# Patient Record
Sex: Female | Born: 1960 | Race: White | Hispanic: No | Marital: Single | Smoking: Former smoker
Health system: Southern US, Community
[De-identification: ages and names within clinical notes are randomized; demographics above are authoritative.]

## PROBLEM LIST (undated history)

## (undated) DIAGNOSIS — C801 Malignant (primary) neoplasm, unspecified: Secondary | ICD-10-CM

## (undated) DIAGNOSIS — K219 Gastro-esophageal reflux disease without esophagitis: Secondary | ICD-10-CM

## (undated) DIAGNOSIS — I739 Peripheral vascular disease, unspecified: Secondary | ICD-10-CM

## (undated) DIAGNOSIS — I75022 Atheroembolism of left lower extremity: Secondary | ICD-10-CM

## (undated) HISTORY — DX: Atheroembolism of left lower extremity: I75.022

---

## 1998-05-21 ENCOUNTER — Encounter: Admission: RE | Admit: 1998-05-21 | Discharge: 1998-05-21 | Payer: Self-pay | Admitting: Family Medicine

## 1998-06-15 ENCOUNTER — Emergency Department (HOSPITAL_COMMUNITY): Admission: EM | Admit: 1998-06-15 | Discharge: 1998-06-15 | Payer: Self-pay | Admitting: Emergency Medicine

## 1998-07-25 ENCOUNTER — Encounter: Admission: RE | Admit: 1998-07-25 | Discharge: 1998-07-25 | Payer: Self-pay | Admitting: Family Medicine

## 1998-10-23 ENCOUNTER — Encounter: Admission: RE | Admit: 1998-10-23 | Discharge: 1998-10-23 | Payer: Self-pay | Admitting: Sports Medicine

## 1999-01-05 ENCOUNTER — Ambulatory Visit (HOSPITAL_COMMUNITY): Admission: RE | Admit: 1999-01-05 | Discharge: 1999-01-05 | Payer: Self-pay | Admitting: Neurosurgery

## 1999-01-05 ENCOUNTER — Encounter: Payer: Self-pay | Admitting: Neurosurgery

## 1999-02-06 ENCOUNTER — Encounter: Admission: RE | Admit: 1999-02-06 | Discharge: 1999-02-06 | Payer: Self-pay | Admitting: Family Medicine

## 1999-05-08 ENCOUNTER — Encounter: Admission: RE | Admit: 1999-05-08 | Discharge: 1999-05-08 | Payer: Self-pay | Admitting: Family Medicine

## 1999-09-25 ENCOUNTER — Encounter: Payer: Self-pay | Admitting: Emergency Medicine

## 1999-09-25 ENCOUNTER — Emergency Department (HOSPITAL_COMMUNITY): Admission: EM | Admit: 1999-09-25 | Discharge: 1999-09-25 | Payer: Self-pay | Admitting: Emergency Medicine

## 1999-10-18 ENCOUNTER — Encounter: Admission: RE | Admit: 1999-10-18 | Discharge: 1999-10-18 | Payer: Self-pay | Admitting: Family Medicine

## 2000-07-05 ENCOUNTER — Emergency Department (HOSPITAL_COMMUNITY): Admission: EM | Admit: 2000-07-05 | Discharge: 2000-07-05 | Payer: Self-pay | Admitting: Emergency Medicine

## 2000-07-08 ENCOUNTER — Encounter: Admission: RE | Admit: 2000-07-08 | Discharge: 2000-07-08 | Payer: Self-pay | Admitting: Family Medicine

## 2000-12-08 ENCOUNTER — Encounter: Admission: RE | Admit: 2000-12-08 | Discharge: 2000-12-08 | Payer: Self-pay | Admitting: Family Medicine

## 2000-12-08 ENCOUNTER — Encounter: Payer: Self-pay | Admitting: Family Medicine

## 2000-12-15 ENCOUNTER — Encounter: Admission: RE | Admit: 2000-12-15 | Discharge: 2000-12-15 | Payer: Self-pay | Admitting: Family Medicine

## 2001-02-15 ENCOUNTER — Encounter: Admission: RE | Admit: 2001-02-15 | Discharge: 2001-02-15 | Payer: Self-pay | Admitting: Family Medicine

## 2001-04-29 ENCOUNTER — Encounter: Payer: Self-pay | Admitting: *Deleted

## 2001-04-29 ENCOUNTER — Encounter: Admission: RE | Admit: 2001-04-29 | Discharge: 2001-04-29 | Payer: Self-pay | Admitting: *Deleted

## 2001-04-29 ENCOUNTER — Encounter: Admission: RE | Admit: 2001-04-29 | Discharge: 2001-04-29 | Payer: Self-pay | Admitting: Family Medicine

## 2001-05-05 ENCOUNTER — Encounter: Admission: RE | Admit: 2001-05-05 | Discharge: 2001-05-05 | Payer: Self-pay | Admitting: Family Medicine

## 2002-07-13 ENCOUNTER — Encounter: Payer: Self-pay | Admitting: Sports Medicine

## 2002-07-13 ENCOUNTER — Encounter: Admission: RE | Admit: 2002-07-13 | Discharge: 2002-07-13 | Payer: Self-pay | Admitting: Family Medicine

## 2002-07-13 ENCOUNTER — Encounter: Admission: RE | Admit: 2002-07-13 | Discharge: 2002-07-13 | Payer: Self-pay | Admitting: Sports Medicine

## 2002-07-19 ENCOUNTER — Encounter: Admission: RE | Admit: 2002-07-19 | Discharge: 2002-07-19 | Payer: Self-pay | Admitting: Family Medicine

## 2002-08-02 ENCOUNTER — Encounter: Payer: Self-pay | Admitting: Sports Medicine

## 2002-08-02 ENCOUNTER — Encounter: Admission: RE | Admit: 2002-08-02 | Discharge: 2002-08-02 | Payer: Self-pay | Admitting: Sports Medicine

## 2002-08-09 ENCOUNTER — Encounter: Admission: RE | Admit: 2002-08-09 | Discharge: 2002-08-09 | Payer: Self-pay | Admitting: Family Medicine

## 2002-08-11 ENCOUNTER — Encounter: Admission: RE | Admit: 2002-08-11 | Discharge: 2002-08-11 | Payer: Self-pay | Admitting: Family Medicine

## 2002-08-18 ENCOUNTER — Encounter: Admission: RE | Admit: 2002-08-18 | Discharge: 2002-08-18 | Payer: Self-pay | Admitting: Family Medicine

## 2002-08-25 ENCOUNTER — Encounter: Admission: RE | Admit: 2002-08-25 | Discharge: 2002-08-25 | Payer: Self-pay | Admitting: Sports Medicine

## 2002-09-07 ENCOUNTER — Encounter: Admission: RE | Admit: 2002-09-07 | Discharge: 2002-09-07 | Payer: Self-pay | Admitting: Sports Medicine

## 2002-09-07 ENCOUNTER — Encounter: Admission: RE | Admit: 2002-09-07 | Discharge: 2002-09-07 | Payer: Self-pay | Admitting: Family Medicine

## 2002-09-07 ENCOUNTER — Encounter: Payer: Self-pay | Admitting: Sports Medicine

## 2002-09-23 ENCOUNTER — Encounter: Admission: RE | Admit: 2002-09-23 | Discharge: 2002-09-23 | Payer: Self-pay | Admitting: Family Medicine

## 2002-10-10 ENCOUNTER — Encounter: Admission: RE | Admit: 2002-10-10 | Discharge: 2002-10-10 | Payer: Self-pay | Admitting: Family Medicine

## 2002-10-26 ENCOUNTER — Encounter: Admission: RE | Admit: 2002-10-26 | Discharge: 2002-10-26 | Payer: Self-pay | Admitting: Sports Medicine

## 2002-12-27 ENCOUNTER — Encounter: Admission: RE | Admit: 2002-12-27 | Discharge: 2002-12-27 | Payer: Self-pay | Admitting: Family Medicine

## 2007-02-25 DIAGNOSIS — F411 Generalized anxiety disorder: Secondary | ICD-10-CM | POA: Insufficient documentation

## 2007-02-25 DIAGNOSIS — F329 Major depressive disorder, single episode, unspecified: Secondary | ICD-10-CM | POA: Insufficient documentation

## 2007-02-25 DIAGNOSIS — K219 Gastro-esophageal reflux disease without esophagitis: Secondary | ICD-10-CM

## 2019-04-05 ENCOUNTER — Ambulatory Visit (HOSPITAL_COMMUNITY)
Admission: RE | Admit: 2019-04-05 | Discharge: 2019-04-05 | Disposition: A | Discharge status: Home / Self Care | Payer: Self-pay | Source: Ambulatory Visit | Attending: Vascular Surgery | Admitting: Vascular Surgery

## 2019-04-05 ENCOUNTER — Other Ambulatory Visit (HOSPITAL_COMMUNITY): Payer: Self-pay | Admitting: Vascular Surgery

## 2019-04-05 ENCOUNTER — Encounter: Payer: Self-pay | Admitting: Vascular Surgery

## 2019-04-05 ENCOUNTER — Other Ambulatory Visit: Payer: Self-pay

## 2019-04-05 ENCOUNTER — Encounter: Payer: Self-pay | Admitting: *Deleted

## 2019-04-05 ENCOUNTER — Ambulatory Visit (INDEPENDENT_AMBULATORY_CARE_PROVIDER_SITE_OTHER)
Admission: RE | Admit: 2019-04-05 | Discharge: 2019-04-05 | Disposition: A | Discharge status: Home / Self Care | Payer: Self-pay | Source: Ambulatory Visit | Attending: Family | Admitting: Family

## 2019-04-05 ENCOUNTER — Ambulatory Visit (INDEPENDENT_AMBULATORY_CARE_PROVIDER_SITE_OTHER): Payer: Self-pay | Admitting: Vascular Surgery

## 2019-04-05 VITALS — BP 162/81 | HR 76 | Temp 97.2°F | Resp 20 | Ht 64.0 in | Wt 137.0 lb

## 2019-04-05 DIAGNOSIS — M79605 Pain in left leg: Secondary | ICD-10-CM

## 2019-04-05 DIAGNOSIS — I739 Peripheral vascular disease, unspecified: Secondary | ICD-10-CM

## 2019-04-05 NOTE — Progress Notes (Signed)
Number for financial counselor given. Patient would not book case today. I gave her my number to call when ready to schedule procedure. Printed information given explaining procedure and aftercare. Encouraged to call to book soon due to severity of condition.

## 2019-04-05 NOTE — Progress Notes (Signed)
[facility] [place]  Patient name: Barbara Watkins MRN: [id number] DOB: 1961/09/16 Sex: female  REASON FOR CONSULT: Evaluation of left foot ischemia  HPI: Barbara Watkins is a 58 y.o. female, who is here today for evaluation of left foot ischemia.  She reports that approximately days ago she began noticing pain and bluish discoloration on the toes of her left foot.  She presented to emergency room and [facility] on 04/02/2019.  She was placed on aspirin therapy and discharged home and referred to our office.  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  She does report that she uses her left foot but did never have trouble up into this point.  She is a longtime cigarette smoker but reports that she quit 3 weeks ago.  She does report left calf claudication symptoms.  This is mildly limiting to her.  She reports that if she walks up several flights of stairs that she does have a calf tightness and has to delay proceeding.  This is not limiting to her.  Has no cardiac history  History reviewed. No pertinent past medical history.  History reviewed. No pertinent family history.  SOCIAL HISTORY: Social History   Socioeconomic History  . Marital status: Single    Spouse name: Not on file  . Number of children: Not on file  . Years of education: Not on file  . Highest education level: Not on file  Occupational History  . Not on file  Social Needs  . Financial resource strain: Not on file  . Food insecurity:    Worry: Not on file    Inability: Not on file  . Transportation needs:    Medical: Not on file    Non-medical: Not on file  Tobacco Use  . Smoking status: Former Smoker    Packs/day: 0.25    Types: Cigarettes    Last attempt to quit: 03/21/2019    Years since quitting: 0.0  . Smokeless tobacco: Never Used  Substance and Sexual Activity  . Alcohol use: Not Currently  . Drug use: Not Currently  . Sexual activity:  Not on file  Lifestyle  . Physical activity:    Days per week: Not on file    Minutes per session: Not on file  . Stress: Not on file  Relationships  . Social connections:    Talks on phone: Not on file    Gets together: Not on file    Attends religious service: Not on file    Active member of club or organization: Not on file    Attends meetings of clubs or organizations: Not on file    Relationship status: Not on file  . Intimate partner violence:    Fear of current or ex partner: Not on file    Emotionally abused: Not on file    Physically abused: Not on file    Forced sexual activity: Not on file  Other Topics Concern  . Not on file  Social History Narrative  . Not on file    No Known Allergies  Current Outpatient Medications  Medication Sig Dispense Refill  . aspirin EC 81 MG tablet Take 81 mg by mouth daily.    . naproxen sodium (ALEVE) 220 MG tablet Take 220 mg by mouth daily as needed.     No current facility-administered medications for this visit.     REVIEW OF SYSTEMS:  [X]  denotes positive finding, [ ]  denotes negative finding  Cardiac  Comments:  Chest pain or chest pressure:    Shortness of breath upon exertion:    Short of breath when lying flat:    Irregular heart rhythm:        Vascular    Pain in calf, thigh, or hip brought on by ambulation: x   Pain in feet at night that wakes you up from your sleep:  x   Blood clot in your veins:    Leg swelling:         Pulmonary    Oxygen at home:    Productive cough:     Wheezing:         Neurologic    Sudden weakness in arms or legs:     Sudden numbness in arms or legs:     Sudden onset of difficulty speaking or slurred speech:    Temporary loss of vision in one eye:     Problems with dizziness:         Gastrointestinal    Blood in stool:     Vomited blood:         Genitourinary    Burning when urinating:     Blood in urine:        Psychiatric    Major depression:         Hematologic     Bleeding problems:    Problems with blood clotting too easily:        Skin    Rashes or ulcers:        Constitutional    Fever or chills:      PHYSICAL EXAM: Vitals:   04/05/19 1110  BP: (!) 162/81  Pulse: 76  Resp: 20  Temp: (!) 97.2 F (36.2 C)  SpO2: 99%  Weight: 137 lb (62.1 kg)  Height: 5\' 4"  (1.626 m)    GENERAL: The patient is a well-nourished female, in no acute distress. The vital signs are documented above. CARDIOVASCULAR: Plus radial pulses bilaterally.  Palpable femoral pulses bilaterally.  Absent left popliteal and distal pulses.  2+ right dorsalis pedis pulse PULMONARY: There is good air exchange  ABDOMEN: Soft and non-tender.  No bruit present MUSCULOSKELETAL: There are no major deformities or cyanosis. NEUROLOGIC: No focal weakness or paresthesias are detected. SKIN: There are no ulcers or rashes noted.  Cyanosis of the toes of her left foot PSYCHIATRIC: The patient has a normal affect.  DATA:  Noninvasive studies reveal normal ankle arm index on the right with triphasic waveforms.  On the left her ankle arm index is 0.74 with diminished waveforms.  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  I have recommended arteriography for further evaluation potential treatment.  I did explain the risk of ongoing embolization and potential limb loss.  She is a concerned regarding finances since she does not have any insurance.  We will also give her contact information for financial resources through [place]. Bleu Moisan, MD [facility] Tel [phone] Pager [phone]

## 2019-04-06 ENCOUNTER — Other Ambulatory Visit: Payer: Self-pay | Admitting: *Deleted

## 2019-04-06 NOTE — Progress Notes (Signed)
Instructed to be at [facility] admitting at 6:30 am on 04/11/2019. Visitor restrictions discussed. No food or drink after MN night prior. No am meds to take. Discussed expectations/ procedure. Answered questions. Must have a driver and caregiver for discharge.  Verbalized understanding.

## 2019-04-07 ENCOUNTER — Telehealth: Payer: Self-pay | Admitting: *Deleted

## 2019-04-07 NOTE — Telephone Encounter (Signed)
Left message on voice mail. 04/11/2019 procedure time changed. To arrive at [facility] admitting at 8  am. All other instructions unchanged.

## 2019-04-11 ENCOUNTER — Encounter (HOSPITAL_COMMUNITY)
Admission: RE | Disposition: A | Discharge status: Home / Self Care | Payer: Self-pay | Source: Home / Self Care | Attending: Vascular Surgery

## 2019-04-11 ENCOUNTER — Other Ambulatory Visit: Payer: Self-pay

## 2019-04-11 ENCOUNTER — Ambulatory Visit (HOSPITAL_COMMUNITY)
Admission: RE | Admit: 2019-04-11 | Discharge: 2019-04-11 | Disposition: A | Discharge status: Home / Self Care | Payer: Self-pay | Attending: Vascular Surgery | Admitting: Vascular Surgery

## 2019-04-11 DIAGNOSIS — M79672 Pain in left foot: Secondary | ICD-10-CM | POA: Insufficient documentation

## 2019-04-11 DIAGNOSIS — Z7982 Long term (current) use of aspirin: Secondary | ICD-10-CM | POA: Insufficient documentation

## 2019-04-11 DIAGNOSIS — I998 Other disorder of circulatory system: Secondary | ICD-10-CM | POA: Insufficient documentation

## 2019-04-11 DIAGNOSIS — Z87891 Personal history of nicotine dependence: Secondary | ICD-10-CM | POA: Insufficient documentation

## 2019-04-11 HISTORY — PX: PERIPHERAL VASCULAR INTERVENTION: CATH118257

## 2019-04-11 HISTORY — PX: ABDOMINAL AORTOGRAM W/LOWER EXTREMITY: CATH118223

## 2019-04-11 LAB — POCT I-STAT 4, (NA,K, GLUC, HGB,HCT)
Glucose, Bld: 109 mg/dL — ABNORMAL HIGH (ref 70–99)
HCT: 41 % (ref 36.0–46.0)
Hemoglobin: 13.9 g/dL (ref 12.0–15.0)
Potassium: 3.8 mmol/L (ref 3.5–5.1)
Sodium: 138 mmol/L (ref 135–145)

## 2019-04-11 LAB — POCT I-STAT CREATININE: Creatinine, Ser: 0.7 mg/dL (ref 0.44–1.00)

## 2019-04-11 SURGERY — ABDOMINAL AORTOGRAM W/LOWER EXTREMITY
Anesthesia: LOCAL

## 2019-04-11 MED ORDER — FENTANYL CITRATE (PF) 100 MCG/2ML IJ SOLN
INTRAMUSCULAR | Status: AC
  Filled 2019-04-11: qty 2

## 2019-04-11 MED ORDER — OXYCODONE HCL 5 MG PO TABS: 5.0000 mg | ORAL_TABLET | ORAL | Status: DC | PRN

## 2019-04-11 MED ORDER — LIDOCAINE HCL (PF) 1 % IJ SOLN
INTRAMUSCULAR | Status: AC
  Filled 2019-04-11: qty 30

## 2019-04-11 MED ORDER — SODIUM CHLORIDE 0.9 % WEIGHT BASED INFUSION: 1.0000 mL/kg/h | INTRAVENOUS | Status: DC

## 2019-04-11 MED ORDER — LIDOCAINE HCL (PF) 1 % IJ SOLN
INTRAMUSCULAR | Status: DC | PRN
  Administered 2019-04-11 (×2): 15 mL via INTRADERMAL

## 2019-04-11 MED ORDER — ASPIRIN EC 81 MG PO TBEC: 81.0000 mg | DELAYED_RELEASE_TABLET | Freq: Every day | ORAL | Status: DC

## 2019-04-11 MED ORDER — CLOPIDOGREL BISULFATE 75 MG PO TABS: 300.0000 mg | ORAL_TABLET | Freq: Once | ORAL | Status: DC

## 2019-04-11 MED ORDER — SODIUM CHLORIDE 0.9 % IV SOLN: 250.0000 mL | INTRAVENOUS | Status: DC | PRN

## 2019-04-11 MED ORDER — SODIUM CHLORIDE 0.9% FLUSH: 3.0000 mL | Freq: Two times a day (BID) | INTRAVENOUS | Status: DC

## 2019-04-11 MED ORDER — HEPARIN (PORCINE) IN NACL 1000-0.9 UT/500ML-% IV SOLN
INTRAVENOUS | Status: AC
  Filled 2019-04-11: qty 1000

## 2019-04-11 MED ORDER — HEPARIN SODIUM (PORCINE) 1000 UNIT/ML IJ SOLN
INTRAMUSCULAR | Status: DC | PRN
  Administered 2019-04-11: 6000 [IU] via INTRAVENOUS

## 2019-04-11 MED ORDER — CLOPIDOGREL BISULFATE 300 MG PO TABS
ORAL_TABLET | ORAL | Status: AC
  Filled 2019-04-11: qty 1

## 2019-04-11 MED ORDER — SODIUM CHLORIDE 0.9 % IV SOLN
INTRAVENOUS | Status: DC
  Administered 2019-04-11: 09:00:00 via INTRAVENOUS

## 2019-04-11 MED ORDER — MIDAZOLAM HCL 2 MG/2ML IJ SOLN
INTRAMUSCULAR | Status: AC
  Filled 2019-04-11: qty 2

## 2019-04-11 MED ORDER — LABETALOL HCL 5 MG/ML IV SOLN: 10.0000 mg | INTRAVENOUS | Status: DC | PRN

## 2019-04-11 MED ORDER — CLOPIDOGREL BISULFATE 300 MG PO TABS
ORAL_TABLET | ORAL | Status: DC | PRN
  Administered 2019-04-11: 300 mg via ORAL

## 2019-04-11 MED ORDER — ONDANSETRON HCL 4 MG/2ML IJ SOLN: 4.0000 mg | Freq: Four times a day (QID) | INTRAMUSCULAR | Status: DC | PRN

## 2019-04-11 MED ORDER — SODIUM CHLORIDE 0.9% FLUSH: 3.0000 mL | INTRAVENOUS | Status: DC | PRN

## 2019-04-11 MED ORDER — CLOPIDOGREL BISULFATE 75 MG PO TABS: 75.0000 mg | ORAL_TABLET | Freq: Every day | ORAL | 11 refills | Status: AC

## 2019-04-11 MED ORDER — ATORVASTATIN CALCIUM 20 MG PO TABS: 20.0000 mg | ORAL_TABLET | Freq: Every day | ORAL | 11 refills | Status: DC

## 2019-04-11 MED ORDER — FENTANYL CITRATE (PF) 100 MCG/2ML IJ SOLN
25.0000 ug | INTRAMUSCULAR | Status: DC | PRN
  Administered 2019-04-11: 25 ug via INTRAVENOUS

## 2019-04-11 MED ORDER — CLOPIDOGREL BISULFATE 75 MG PO TABS: 75.0000 mg | ORAL_TABLET | Freq: Every day | ORAL | Status: DC

## 2019-04-11 MED ORDER — HYDRALAZINE HCL 20 MG/ML IJ SOLN: 5.0000 mg | INTRAMUSCULAR | Status: DC | PRN

## 2019-04-11 MED ORDER — FENTANYL CITRATE (PF) 100 MCG/2ML IJ SOLN
INTRAMUSCULAR | Status: DC | PRN
  Administered 2019-04-11 (×2): 25 ug via INTRAVENOUS

## 2019-04-11 MED ORDER — IODIXANOL 320 MG/ML IV SOLN
INTRAVENOUS | Status: DC | PRN
  Administered 2019-04-11: 160 mL via INTRA_ARTERIAL

## 2019-04-11 MED ORDER — HEPARIN (PORCINE) IN NACL 1000-0.9 UT/500ML-% IV SOLN
INTRAVENOUS | Status: DC | PRN
  Administered 2019-04-11 (×2): 500 mL

## 2019-04-11 MED ORDER — MIDAZOLAM HCL 2 MG/2ML IJ SOLN
INTRAMUSCULAR | Status: DC | PRN
  Administered 2019-04-11 (×2): 1 mg via INTRAVENOUS

## 2019-04-11 MED ORDER — ATORVASTATIN CALCIUM 20 MG PO TABS: 20.0000 mg | ORAL_TABLET | Freq: Every day | ORAL | Status: DC

## 2019-04-11 MED ORDER — ACETAMINOPHEN 325 MG PO TABS: 650.0000 mg | ORAL_TABLET | ORAL | Status: DC | PRN

## 2019-04-11 MED ORDER — HYDROCODONE-ACETAMINOPHEN 5-325 MG PO TABS: 1.0000 | ORAL_TABLET | ORAL | 0 refills | Status: DC | PRN

## 2019-04-11 SURGICAL SUPPLY — 19 items
CATH BEACON 5 .035 65 KMP TIP (CATHETERS) ×1 IMPLANT
CATH OMNI FLUSH 5F 65CM (CATHETERS) ×1 IMPLANT
CLOSURE MYNX CONTROL 5F (Vascular Products) ×1 IMPLANT
CLOSURE MYNX CONTROL 6F/7F (Vascular Products) ×1 IMPLANT
GUIDEWIRE ANGLED .035X150CM (WIRE) ×1 IMPLANT
KIT ENCORE 26 ADVANTAGE (KITS) ×1 IMPLANT
KIT MICROPUNCTURE NIT STIFF (SHEATH) ×1 IMPLANT
KIT PV (KITS) ×3 IMPLANT
SHEATH BRITE TIP 7FR 35CM (SHEATH) ×1 IMPLANT
SHEATH PINNACLE 5F 10CM (SHEATH) ×2 IMPLANT
SHEATH PINNACLE 7F 10CM (SHEATH) ×1 IMPLANT
SHEATH PROBE COVER 6X72 (BAG) ×1 IMPLANT
STENT VIABAHN VBX 6X39X135 (Permanent Stent) ×1 IMPLANT
SYR MEDRAD MARK V 150ML (SYRINGE) ×1 IMPLANT
TRANSDUCER W/STOPCOCK (MISCELLANEOUS) ×3 IMPLANT
TRAY PV CATH (CUSTOM PROCEDURE TRAY) ×3 IMPLANT
WIRE AMPLATZ SS-J .035X180CM (WIRE) ×1 IMPLANT
WIRE AMPLATZ SS-J .035X260CM (WIRE) ×1 IMPLANT
WIRE BENTSON .035X145CM (WIRE) ×1 IMPLANT

## 2019-04-11 NOTE — Progress Notes (Signed)
Pt sitting up in bed rocking and holding her left foot, toes purple and part of foot. Pt does not take anything currently for pain, tears are streaming down and she is continuing to rock despite repositioning. Dr Carlis Abbott was called and updated, orders followed.

## 2019-04-11 NOTE — Discharge Instructions (Signed)
Angiogram, Care After °This sheet gives you information about how to care for yourself after your procedure. Your health care provider may also give you more specific instructions. If you have problems or questions, contact your health care provider. °What can I expect after the procedure? °After the procedure, it is common to have bruising and tenderness at the catheter insertion area. °Follow these instructions at home: °Insertion site care °· Follow instructions from your health care provider about how to take care of your insertion site. Make sure you: °? Wash your hands with soap and water before you change your bandage (dressing). If soap and water are not available, use hand sanitizer. °? Change your dressing as told by your health care provider. °? Leave stitches (sutures), skin glue, or adhesive strips in place. These skin closures may need to stay in place for 2 weeks or longer. If adhesive strip edges start to loosen and curl up, you may trim the loose edges. Do not remove adhesive strips completely unless your health care provider tells you to do that. °· Do not take baths, swim, or use a hot tub until your health care provider approves. °· You may shower 24-48 hours after the procedure or as told by your health care provider. °? Gently wash the site with plain soap and water. °? Pat the area dry with a clean towel. °? Do not rub the site. This may cause bleeding. °· Do not apply powder or lotion to the site. Keep the site clean and dry. °· Check your insertion site every day for signs of infection. Check for: °? Redness, swelling, or pain. °? Fluid or blood. °? Warmth. °? Pus or a bad smell. °Activity °· Rest as told by your health care provider, usually for 1-2 days. °· Do not lift anything that is heavier than 10 lbs. (4.5 kg) or as told by your health care provider. °· Do not drive for 24 hours if you were given a medicine to help you relax (sedative). °· Do not drive or use heavy machinery while  taking prescription pain medicine. °General instructions ° °· Return to your normal activities as told by your health care provider, usually in about a week. Ask your health care provider what activities are safe for you. °· If the catheter site starts bleeding, lie flat and put pressure on the site. If the bleeding does not stop, get help right away. This is a medical emergency. °· Drink enough fluid to keep your urine clear or pale yellow. This helps flush the contrast dye from your body. °· Take over-the-counter and prescription medicines only as told by your health care provider. °· Keep all follow-up visits as told by your health care provider. This is important. °Contact a health care provider if: °· You have a fever or chills. °· You have redness, swelling, or pain around your insertion site. °· You have fluid or blood coming from your insertion site. °· The insertion site feels warm to the touch. °· You have pus or a bad smell coming from your insertion site. °· You have bruising around the insertion site. °· You notice blood collecting in the tissue around the catheter site (hematoma). The hematoma may be painful to the touch. °Get help right away if: °· You have severe pain at the catheter insertion area. °· The catheter insertion area swells very fast. °· The catheter insertion area is bleeding, and the bleeding does not stop when you hold steady pressure on the area. °·   The area near or just beyond the catheter insertion site becomes pale, cool, tingly, or numb. These symptoms may represent a serious problem that is an emergency. Do not wait to see if the symptoms will go away. Get medical help right away. Call your local emergency services (911 in the [place] Do not drive yourself to the hospital. Summary  After the procedure, it is common to have bruising and tenderness at the catheter insertion area.  After the procedure, it is important to rest and drink plenty of fluids.  Do not take baths,  swim, or use a hot tub until your health care provider says it is okay to do so. You may shower 24-48 hours after the procedure or as told by your health care provider.  If the catheter site starts bleeding, lie flat and put pressure on the site. If the bleeding does not stop, get help right away. This is a medical emergency. This information is not intended to replace advice given to you by your health care provider. Make sure you discuss any questions you have with your health care provider. Document Released: 07/03/2005 Document Revised: 11/19/2016 Document Reviewed: 11/19/2016 Elsevier Interactive Patient Education  2019 Elsevier Inc. Clopidogrel tablets What is this medicine? CLOPIDOGREL (kloh PID oh grel) helps to prevent blood clots. This medicine is used to prevent heart attack, stroke, or other vascular events in people who are at high risk. This medicine may be used for other purposes; ask your health care provider or pharmacist if you have questions. COMMON BRAND NAME(S): Plavix What should I tell my health care provider before I take this medicine? They need to know if you have any of the following conditions: -bleeding disorders -bleeding in the brain -having surgery -history of stomach bleeding -an unusual or allergic reaction to clopidogrel, other medicines, foods, dyes, or preservatives -pregnant or trying to get pregnant -breast-feeding How should I use this medicine? Take this medicine by mouth with a glass of water. Follow the directions on the prescription label. You may take this medicine with or without food. If it upsets your stomach, take it with food. Take your medicine at regular intervals. Do not take it more often than directed. Do not stop taking except on your doctor's advice. A special MedGuide will be given to you by the pharmacist with each prescription and refill. Be sure to read this information carefully each time. Talk to your pediatrician regarding the  use of this medicine in children. Special care may be needed. Overdosage: If you think you have taken too much of this medicine contact a poison control center or emergency room at once. NOTE: This medicine is only for you. Do not share this medicine with others. What if I miss a dose? If you miss a dose, take it as soon as you can. If it is almost time for your next dose, take only that dose. Do not take double or extra doses. What may interact with this medicine? Do not take this medicine with the following medications: -dasabuvir; ombitasvir; paritaprevir; ritonavir -defibrotide -selexipag This medicine may also interact with the following medications: -certain medicines that treat or prevent blood clots like warfarin -narcotic medicines for pain -NSAIDs, medicines for pain and inflammation, like ibuprofen or naproxen -repaglinide -SNRIs, medicines for depression, like desvenlafaxine, duloxetine, levomilnacipran, venlafaxine -SSRIs, medicines for depression, like citalopram, escitalopram, fluoxetine, fluvoxamine, paroxetine, sertraline -stomach acid blockers like cimetidine, esomeprazole, omeprazole This list may not describe all possible interactions. Give your health care provider a  list of all the medicines, herbs, non-prescription drugs, or dietary supplements you use. Also tell them if you smoke, drink alcohol, or use illegal drugs. Some items may interact with your medicine. What should I watch for while using this medicine? Visit your doctor or health care professional for regular check-ups. Do not stop taking your medicine unless your doctor tells you to. Notify your doctor or health care professional and seek emergency treatment if you develop breathing problems; changes in vision; chest pain; severe, sudden headache; pain, swelling, warmth in the leg; trouble speaking; sudden numbness or weakness of the face, arm or leg. These can be signs that your condition has gotten worse. If  you are going to have surgery or dental work, tell your doctor or health care professional that you are taking this medicine. Certain genetic factors may reduce the effect of this medicine. Your doctor may use genetic tests to determine treatment. Only take aspirin if you are instructed to. Low doses of aspirin are used with this medicine to treat some conditions. Taking aspirin with this medicine can increase your risk of bleeding so you must be careful. Talk to your doctor or pharmacist if you have questions. What side effects may I notice from receiving this medicine? Side effects that you should report to your doctor or health care professional as soon as possible: -allergic reactions like skin rash, itching or hives, swelling of the face, lips, or tongue -signs and symptoms of bleeding such as bloody or black, tarry stools; red or dark-brown urine; spitting up blood or brown material that looks like coffee grounds; red spots on the skin; unusual bruising or bleeding from the eye, gums, or nose -signs and symptoms of a blood clot such as breathing problems; changes in vision; chest pain; severe, sudden headache; pain, swelling, warmth in the leg; trouble speaking; sudden numbness or weakness of the face, arm or leg -signs and symptoms of low blood sugar such as feeling anxious; confusion; dizziness; increased hunger; unusually weak or tired; increased sweating; shakiness; cold, clammy skin; irritable; headache; blurred vision; fast heartbeat; loss of consciousness Side effects that usually do not require medical attention (report to your doctor or health care professional if they continue or are bothersome): -constipation -diarrhea -headache -upset stomach This list may not describe all possible side effects. Call your doctor for medical advice about side effects. You may report side effects to FDA at 1-800-FDA-1088. Where should I keep my medicine? Keep out of the reach of children. Store at  room temperature of 59 to 86 degrees F (15 to 30 degrees C). Throw away any unused medicine after the expiration date. NOTE: This sheet is a summary. It may not cover all possible information. If you have questions about this medicine, talk to your doctor, pharmacist, or health care provider.  2019 Elsevier/Gold Standard (2018-05-17 15:03:38) Atorvastatin tablets What is this medicine? ATORVASTATIN (a TORE va sta tin) is known as a HMG-CoA reductase inhibitor or 'statin'. It lowers the level of cholesterol and triglycerides in the blood. This drug may also reduce the risk of heart attack, stroke, or other health problems in patients with risk factors for heart disease. Diet and lifestyle changes are often used with this drug. This medicine may be used for other purposes; ask your health care provider or pharmacist if you have questions. COMMON BRAND NAME(S): Lipitor What should I tell my health care provider before I take this medicine? They need to know if you have any of these conditions: -  diabetes -if you often drink alcohol -history of stroke -kidney disease -liver disease -muscle aches or weakness -thyroid disease -an unusual or allergic reaction to atorvastatin, other medicines, foods, dyes, or preservatives -pregnant or trying to get pregnant -breast-feeding How should I use this medicine? Take this medicine by mouth with a glass of water. Follow the directions on the prescription label. You can take it with or without food. If it upsets your stomach, take it with food. Do not take with grapefruit juice. Take your medicine at regular intervals. Do not take it more often than directed. Do not stop taking except on your doctor's advice. Talk to your pediatrician regarding the use of this medicine in children. While this drug may be prescribed for children as young as 10 for selected conditions, precautions do apply. Overdosage: If you think you have taken too much of this medicine  contact a poison control center or emergency room at once. NOTE: This medicine is only for you. Do not share this medicine with others. What if I miss a dose? If you miss a dose, take it as soon as you can. If your next dose is to be taken in less than 12 hours, then do not take the missed dose. Take the next dose at your regular time. Do not take double or extra doses. What may interact with this medicine? Do not take this medicine with any of the following medications: -dasabuvir; ombitasvir; paritaprevir; ritonavir -ombitasvir; paritaprevir; ritonavir -posaconazole -red yeast rice This medicine may also interact with the following medications: -alcohol -birth control pills -certain antibiotics like erythromycin and clarithromycin -certain antivirals for HIV or hepatitis -certain medicines for cholesterol like fenofibrate, gemfibrozil, and niacin -certain medicines for fungal infections like ketoconazole and itraconazole -colchicine -cyclosporine -digoxin -grapefruit juice -rifampin This list may not describe all possible interactions. Give your health care provider a list of all the medicines, herbs, non-prescription drugs, or dietary supplements you use. Also tell them if you smoke, drink alcohol, or use illegal drugs. Some items may interact with your medicine. What should I watch for while using this medicine? Visit your doctor or health care professional for regular check-ups. You may need regular tests to make sure your liver is working properly. Your health care professional may tell you to stop taking this medicine if you develop muscle problems. If your muscle problems do not go away after stopping this medicine, contact your health care professional. Do not become pregnant while taking this medicine. Women should inform their health care professional if they wish to become pregnant or think they might be pregnant. There is a potential for serious side effects to an unborn child.  Talk to your health care professional or pharmacist for more information. Do not breast-feed an infant while taking this medicine. This medicine may affect blood sugar levels. If you have diabetes, check with your doctor or health care professional before you change your diet or the dose of your diabetic medicine. If you are going to need surgery or other procedure, tell your doctor that you are using this medicine. This drug is only part of a total heart-health program. Your doctor or a dietician can suggest a low-cholesterol and low-fat diet to help. Avoid alcohol and smoking, and keep a proper exercise schedule. This medicine may cause a decrease in Co-Enzyme Q-10. You should make sure that you get enough Co-Enzyme Q-10 while you are taking this medicine. Discuss the foods you eat and the vitamins you take with your health care  professional. What side effects may I notice from receiving this medicine? Side effects that you should report to your doctor or health care professional as soon as possible: -allergic reactions like skin rash, itching or hives, swelling of the face, lips, or tongue -fever -joint pain -loss of memory -redness, blistering, peeling or loosening of the skin, including inside the mouth -signs and symptoms of liver injury like dark yellow or brown urine; general ill feeling or flu-like symptoms; light-belly pain; unusually weak or tired; yellowing of the eyes or skin -signs and symptoms of muscle injury like dark urine; trouble passing urine or change in the amount of urine; unusually weak or tired; muscle pain or side or back pain Side effects that usually do not require medical attention (report to your doctor or health care professional if they continue or are bothersome): -diarrhea -nausea -stomach pain -trouble sleeping -upset stomach This list may not describe all possible side effects. Call your doctor for medical advice about side effects. You may report side effects  to FDA at 1-800-FDA-1088. Where should I keep my medicine? Keep out of the reach of children. Store between 20 and 25 degrees C (68 and 77 degrees F). Throw away any unused medicine after the expiration date. NOTE: This sheet is a summary. It may not cover all possible information. If you have questions about this medicine, talk to your doctor, pharmacist, or health care provider.  2019 Elsevier/Gold Standard (2018-04-16 11:36:48)

## 2019-04-11 NOTE — Op Note (Signed)
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  Ultrasound-guided access of the right common femoral artery 2.  Aortogram with bilateral lower extremity arteriogram 3.  Ultrasound-guided access of the left common femoral artery 4.  Left common iliac artery angioplasty with stent placement (6 mm x 39 mm balloon expandable VBX covered stent) 5.  Mynx closure of bilateral common femoral arteries 6.  53 minutes of monitored moderate conscious sedation time  Indications: 58 year old female who presented to clinic last week to be evaluated Dr. Donnetta Hutching with concern for left foot ischemia.  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  She presents today for aortogram lower extremity arteriogram and possible intervention after risks benefits were discussed.  Findings: Aortogram showed a focal greater than 90% stenosis of the left common iliac artery with no other significant aortoiliac disease.   Left lower extremity arteriogram which was the side of interest had a patent common femoral, profunda, SFA, popliteal and two-vessel runoff via the anterior tibial and peroneal artery.  The posterior tibial appeared occluded in the left lower extremity but she did reconstitute a plantar arch in the foot. Right lower extremity arteriogram showed a patent common femoral, profunda, SFA, popliteal and three-vessel runoff with no significant vascular disease. Left common iliac stent was placed using retrograde left common femoral artery access with a 6 mm x 39 mm VBX.  Unfortunately her common iliac arteries only measured about 5 mm bilaterally and I was concerned anything larger than a 6 mm may  risk rupturing and/or dissection of the artery. Palpable DP in the left foot at completion of the case with no residual stenosis.   Procedure:  The patient was identified in the holding area and taken to room 8.  The patient was then placed supine on the table and prepped and draped in the usual sterile fashion.  A time out was called.  Ultrasound was used to evaluate the right common femoral artery.  It was patent .  A digital ultrasound image was acquired.  A micropuncture needle was used to access the right common femoral artery under ultrasound guidance.  An 018 wire was advanced without resistance and a micropuncture sheath was placed.  The 018 wire was removed and a benson wire was placed.  The micropuncture sheath was exchanged for a 5 french sheath.  An omniflush catheter was advanced over the wire to the level of L-1.  An abdominal angiogram was obtained.  Next the catheter was pulled down and bilateral lower extremity runoff was obtained with pertinent findings noted above.  Ultimately we made the decision to intervene on her left common iliac high-grade stenosis as the suspected source of atheroembolism.  Her left common femoral artery was then evaluated with ultrasound, it was patent, and an image was saved.  Micro access needle was placed in left common femoral artery, microwire was placed, and exchanged for a micro-sheath.  We then used a Bentson wire exchanged for a short 5 French sheath on the left.  I then used a soft angled glide and a KMP to get across the left common iliac lesion retrograde.  I then used a KMP and exchanged for a long Amplatz wire.  Patient was then given 100  units heparin per kilogram.  I placed a long 7 French bright tip sheath in the left groin past the common iliac stenosis and with a retrograde hand-injection confirmed I was in the true lumen.  I then selected a 6 mm x 39 mm covered VBX stent given the artery only measured 5 mm normal in this proximal diameter.  The stent  was then placed across the lesion, our sheath was pulled back, and we deployed it to just below nominal pressure to prevent dissecting or rupturing the artery given that it was a fairly small vessel.  We then shot one final aortogram with a pigtail at the right groin and it showed inline flow down the left iliac with no evidence of residual stenosis.  Exchanged for a short 7 French sheath in the left groin and removed all catheters and wires on the left.  I removed the pigtail in the right.  5 French Mynx was deployed on the right.  A 7 French mynx was deployed on the left.  Additional manual pressure was held.  I went down and checked the left foot and patient had a palpable dorsalis pedis which was significantly improved.  Complications: None  Marty Heck, MD [facility] [place] Office: [phone] Pager: Jay

## 2019-04-11 NOTE — H&P (Signed)
History and Physical Interval Note:  04/11/2019 10:18 AM  Barbara Watkins  has presented today for surgery, with the diagnosis of ischemic left foot.  The various methods of treatment have been discussed with the patient and family. After consideration of risks, benefits and other options for treatment, the patient has consented to  Procedure(s): ABDOMINAL AORTOGRAM W/LOWER EXTREMITY (N/A) as a surgical intervention.  The patient's history has been reviewed, patient examined, no change in status, stable for surgery.  I have reviewed the patient's chart and labs.  Questions were answered to the patient's satisfaction.    Aortogram, LLE arteriogram.  Concern for blue toe syndrome.  Marty Heck  [facility]  Patient name: Barbara Watkins        MRN: [id number]        DOB: [birth year]          Sex: female  REASON FOR CONSULT: Evaluation of left foot ischemia  HPI: Barbara Watkins is a 58 y.o. female, who is here today for evaluation of left foot ischemia.  She reports that approximately days ago she began noticing pain and bluish discoloration on the toes of her left foot.  She presented to emergency room and [facility] on 04/02/2019.  She was placed on aspirin therapy and discharged home and referred to our office.  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  She does report that she uses her left foot but did never have trouble up into this point.  She is a longtime cigarette smoker but reports that she quit 3 weeks ago.  She does report left calf claudication symptoms.  This is mildly limiting to her.  She reports that if she walks up several flights of stairs that she does have a calf tightness and has to delay proceeding.  This is not limiting to her.  Has no cardiac history  History reviewed. No pertinent past medical history.  History reviewed. No pertinent family history.  SOCIAL HISTORY: Social History        Socioeconomic  History  . Marital status: Single    Spouse name: Not on file  . Number of children: Not on file  . Years of education: Not on file  . Highest education level: Not on file  Occupational History  . Not on file  Social Needs  . Financial resource strain: Not on file  . Food insecurity:    Worry: Not on file    Inability: Not on file  . Transportation needs:    Medical: Not on file    Non-medical: Not on file  Tobacco Use  . Smoking status: Former Smoker    Packs/day: 0.25    Types: Cigarettes    Last attempt to quit: 03/21/2019    Years since quitting: 0.0  . Smokeless tobacco: Never Used  Substance and Sexual Activity  . Alcohol use: Not Currently  . Drug use: Not Currently  . Sexual activity: Not on file  Lifestyle  . Physical activity:    Days per week: Not on file    Minutes per session: Not on file  . Stress: Not on file  Relationships  . Social connections:    Talks on phone: Not on file    Gets together: Not on file    Attends religious service: Not on file    Active member of club or organization: Not on file    Attends meetings of clubs or organizations: Not on file  Relationship status: Not on file  . Intimate partner violence:    Fear of current or ex partner: Not on file    Emotionally abused: Not on file    Physically abused: Not on file    Forced sexual activity: Not on file  Other Topics Concern  . Not on file  Social History Narrative  . Not on file    No Known Allergies        Current Outpatient Medications  Medication Sig Dispense Refill  . aspirin EC 81 MG tablet Take 81 mg by mouth daily.    . naproxen sodium (ALEVE) 220 MG tablet Take 220 mg by mouth daily as needed.     No current facility-administered medications for this visit.     REVIEW OF SYSTEMS:  [X]  denotes positive finding, [ ]  denotes negative finding Cardiac  Comments:  Chest pain or chest pressure:    Shortness of  breath upon exertion:    Short of breath when lying flat:    Irregular heart rhythm:        Vascular    Pain in calf, thigh, or hip brought on by ambulation: x   Pain in feet at night that wakes you up from your sleep:  x   Blood clot in your veins:    Leg swelling:         Pulmonary    Oxygen at home:    Productive cough:     Wheezing:         Neurologic    Sudden weakness in arms or legs:     Sudden numbness in arms or legs:     Sudden onset of difficulty speaking or slurred speech:    Temporary loss of vision in one eye:     Problems with dizziness:         Gastrointestinal    Blood in stool:     Vomited blood:         Genitourinary    Burning when urinating:     Blood in urine:        Psychiatric    Major depression:         Hematologic    Bleeding problems:    Problems with blood clotting too easily:        Skin    Rashes or ulcers:        Constitutional    Fever or chills:      PHYSICAL EXAM: Vitals:   04/05/19 1110  BP: (!) 162/81  Pulse: 76  Resp: 20  Temp: (!) 97.2 F (36.2 C)  SpO2: 99%  Weight: 137 lb (62.1 kg)  Height: 5\' 4"  (1.626 m)    GENERAL: The patient is a well-nourished female, in no acute distress. The vital signs are documented above. CARDIOVASCULAR: Plus radial pulses bilaterally.  Palpable femoral pulses bilaterally.  Absent left popliteal and distal pulses.  2+ right dorsalis pedis pulse PULMONARY: There is good air exchange  ABDOMEN: Soft and non-tender.  No bruit present MUSCULOSKELETAL: There are no major deformities or cyanosis. NEUROLOGIC: No focal weakness or paresthesias are detected. SKIN: There are no ulcers or rashes noted.  Cyanosis of the toes of her left foot PSYCHIATRIC: The patient has a normal affect.  DATA:  Noninvasive studies reveal normal ankle arm index on the right with triphasic waveforms.  On the left her  ankle arm index is 0.74 with diminished waveforms.  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  I have recommended arteriography for further evaluation potential treatment.  I did explain the risk of ongoing embolization and potential limb loss.  She is a concerned regarding finances since she does not have any insurance.  We will also give her contact information for financial resources through [facility]. Early, MD [facility] Tel [phone] Pager [phone]

## 2019-04-12 ENCOUNTER — Encounter (HOSPITAL_COMMUNITY): Payer: Self-pay | Admitting: Vascular Surgery

## 2019-04-14 ENCOUNTER — Other Ambulatory Visit: Payer: Self-pay | Admitting: Vascular Surgery

## 2019-04-14 MED ORDER — HYDROCODONE-ACETAMINOPHEN 5-325 MG PO TABS: 1.0000 | ORAL_TABLET | Freq: Four times a day (QID) | ORAL | 0 refills | Status: DC | PRN

## 2019-04-19 ENCOUNTER — Encounter: Payer: Self-pay | Admitting: Vascular Surgery

## 2019-04-19 ENCOUNTER — Other Ambulatory Visit: Payer: Self-pay

## 2019-04-19 ENCOUNTER — Ambulatory Visit (INDEPENDENT_AMBULATORY_CARE_PROVIDER_SITE_OTHER): Payer: Self-pay | Admitting: Vascular Surgery

## 2019-04-19 DIAGNOSIS — I75029 Atheroembolism of unspecified lower extremity: Secondary | ICD-10-CM | POA: Insufficient documentation

## 2019-04-19 DIAGNOSIS — I75022 Atheroembolism of left lower extremity: Secondary | ICD-10-CM

## 2019-04-19 MED ORDER — HYDROCODONE-ACETAMINOPHEN 5-325 MG PO TABS: 1.0000 | ORAL_TABLET | Freq: Four times a day (QID) | ORAL | 0 refills | Status: DC | PRN

## 2019-04-19 NOTE — Progress Notes (Signed)
Patient name: Barbara Watkins MRN: [id number] DOB: [birth year] Sex: female  REASON FOR VISIT: Ongoing left toe pain after left iliac stent for blue toe syndrome  HPI: Barbara Watkins is a 58 y.o. female the presents for evaluation of ongoing left toe pain and she is s/p covered left common iliac stent last Monday for suspected blue toe syndrome with atheroemboli.  Patient states her toes have been about the same since stent procedure.  She is had no worsening of the blue toe discoloration - feels it is stable.  She feels that her left first second third and fifth toes are most affected.  She feels that this is the same as when she was initially evaluated by Dr. early then referred for arteriogram.  She has been taking Vicodin every 6 hours as needed for pain control.  She can still wiggle her toes.  No fevers or chills.  History reviewed. No pertinent past medical history.  Past Surgical History:  Procedure Laterality Date  . ABDOMINAL AORTOGRAM W/LOWER EXTREMITY N/A 04/11/2019   Procedure: ABDOMINAL AORTOGRAM W/LOWER EXTREMITY;  Surgeon: Marty Heck, MD;  Location: [place] [facility];  Service: Cardiovascular;  Laterality: N/A;  . CESAREAN SECTION    . PERIPHERAL VASCULAR INTERVENTION Left 04/11/2019   Procedure: PERIPHERAL VASCULAR INTERVENTION;  Surgeon: Marty Heck, MD;  Location: [place] [facility];  Service: Cardiovascular;  Laterality: Left;  common iliac    History reviewed. No pertinent family history.  SOCIAL HISTORY: Social History   Tobacco Use  . Smoking status: Former Smoker    Packs/day: 0.25    Types: Cigarettes    Last attempt to quit: 03/21/2019    Years since quitting: 0.0  . Smokeless tobacco: Never Used  Substance Use Topics  . Alcohol use: Not Currently    No Known Allergies  Current Outpatient Medications  Medication Sig Dispense Refill  . aspirin EC 81 MG tablet Take 81 mg by mouth daily.    Marland Kitchen atorvastatin (LIPITOR) 20 MG tablet Take  1 tablet (20 mg total) by mouth daily. 30 tablet 11  . clopidogrel (PLAVIX) 75 MG tablet Take 1 tablet (75 mg total) by mouth daily. 30 tablet 11  . HYDROcodone-acetaminophen (NORCO/VICODIN) 5-325 MG tablet Take 1-2 tablets by mouth every 6 (six) hours as needed for moderate pain or severe pain (1 tablet moderate pain, 2 tablets severe pain). 25 tablet 0  . naproxen sodium (ALEVE) 220 MG tablet Take 220 mg by mouth daily as needed.     No current facility-administered medications for this visit.     REVIEW OF SYSTEMS:  [X]  denotes positive finding, [ ]  denotes negative finding Cardiac  Comments:  Chest pain or chest pressure:    Shortness of breath upon exertion:    Short of breath when lying flat:    Irregular heart rhythm:        Vascular    Pain in calf, thigh, or hip brought on by ambulation:    Pain in feet at night that wakes you up from your sleep:  x Left toes 1, 2, 3, and 5  Blood clot in your veins:    Leg swelling:         Pulmonary    Oxygen at home:    Productive cough:     Wheezing:         Neurologic    Sudden weakness in arms or legs:     Sudden numbness in arms or legs:  Sudden onset of difficulty speaking or slurred speech:    Temporary loss of vision in one eye:     Problems with dizziness:         Gastrointestinal    Blood in stool:     Vomited blood:         Genitourinary    Burning when urinating:     Blood in urine:        Psychiatric    Major depression:         Hematologic    Bleeding problems:    Problems with blood clotting too easily:        Skin    Rashes or ulcers:        Constitutional    Fever or chills:      PHYSICAL EXAM: Vitals:   04/19/19 1011  BP: (!) 152/89  Resp: 16  Temp: (!) 97.4 F (36.3 C)  TempSrc: Oral  SpO2: 96%  Weight: 135 lb 8 oz (61.5 kg)  Height: 5\' 4"  (1.626 m)    GENERAL: The patient is a well-nourished female, in no acute distress. The vital signs are documented above. CARDIAC: There is a  regular rate and rhythm.  VASCULAR:  2+ femoral pulse both groins Bruising to both thighs - healing, no hematoma Left DP 2+ palpable Blue discoloration to left 1st, 2nd, 3rd, and 5th toes as pictured below - some discoloration on distal plantar pad of foot PULMONARY: There is good air exchange bilaterally without wheezing or rales. ABDOMEN: Soft and non-tender with normal pitched bowel sounds.  MUSCULOSKELETAL: There are no major deformities or cyanosis. NEUROLOGIC: No focal weakness or paresthesias are detected. SKIN: There are no ulcers or rashes noted. PSYCHIATRIC: The patient has a normal affect.       DATA:   None  Assessment/Plan:  58 year old female the presents evaluation of ongoing left toe pain following left common iliac stent for suspected blue toe syndrome.  On exam today she has a palpable dorsalis pedis in the left foot which is improved given she only had signals prior to covered stent in the left common iliac.  When she initially presented for evaluation she had discolored toes similar to as depicted above.  Given that she has a palpable pulse in the foot I think her stent is widely patent.  Offered her the option of toe amputations if she feels like the pain is too severe vs possible TMA if this progresses.  Ultimately she would like continue conservative management for now.  I did refill her Vicodin prescription for 25 tablets and also instructed her to start taking 400 mg of ibuprofen 3 times a day.  I will see her again in 1 week for close surveliiance.  Told her to call the office if things are worse - she understands blue toe syndrome places her at high risk for limb loss.   Marty Heck, MD [facility] [place] Office: [phone] Pager: [phone]

## 2019-04-20 ENCOUNTER — Telehealth: Payer: Self-pay

## 2019-04-20 NOTE — Telephone Encounter (Signed)
Pt called and said that she had forgotten to mention that she was having issues with constipation when she was in the office yesterday.   Pt is on Hydrocodone for pain. Called pt and discussed that sometimes taking these medications for a long period of time can increase chance for constipation. She was advised to increase her fluids (water and juice) and to take an OTC stool softern. She will call back if no improvement.   York Cerise, CMA

## 2019-04-25 ENCOUNTER — Ambulatory Visit (INDEPENDENT_AMBULATORY_CARE_PROVIDER_SITE_OTHER): Payer: Self-pay | Admitting: Podiatry

## 2019-04-25 ENCOUNTER — Ambulatory Visit (INDEPENDENT_AMBULATORY_CARE_PROVIDER_SITE_OTHER): Payer: Self-pay

## 2019-04-25 ENCOUNTER — Other Ambulatory Visit: Payer: Self-pay

## 2019-04-25 ENCOUNTER — Encounter: Payer: Self-pay | Admitting: Podiatry

## 2019-04-25 VITALS — BP 169/98 | HR 93 | Temp 98.2°F

## 2019-04-25 DIAGNOSIS — I75022 Atheroembolism of left lower extremity: Secondary | ICD-10-CM

## 2019-04-26 ENCOUNTER — Encounter: Payer: Self-pay | Admitting: Vascular Surgery

## 2019-04-26 ENCOUNTER — Ambulatory Visit (INDEPENDENT_AMBULATORY_CARE_PROVIDER_SITE_OTHER): Payer: Self-pay | Admitting: Vascular Surgery

## 2019-04-26 ENCOUNTER — Other Ambulatory Visit: Payer: Self-pay

## 2019-04-26 DIAGNOSIS — I75022 Atheroembolism of left lower extremity: Secondary | ICD-10-CM | POA: Insufficient documentation

## 2019-04-26 MED ORDER — HYDROCODONE-ACETAMINOPHEN 5-325 MG PO TABS: 1.0000 | ORAL_TABLET | Freq: Four times a day (QID) | ORAL | 0 refills | Status: DC | PRN

## 2019-04-26 NOTE — Progress Notes (Signed)
Patient name: Barbara Watkins MRN: [id number] DOB: December 11, 1961 Sex: female  REASON FOR VISIT: Ongoing left toe pain after left iliac stent for blue toe syndrome  HPI: Barbara Watkins is a 58 y.o. female that presents for ongoing follow-up of left toe pain s/p covered left common iliac stent for blue toe syndrome with atheroemboli on 04/11/19.  Her left toes have been very painful since before the stent procedure.  She feels that the pain is slowly improving since she was initially evaluated by Dr. early then referred for arteriogram - although still very severe at times.  She has been taking Vicodin every 6 hours as needed for pain control.  She can still wiggle her toes.  No fevers or chills.  Past Medical History:  Diagnosis Date  . 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 ABDOMINAL AORTOGRAM W/LOWER EXTREMITY N/A 04/11/2019   Procedure: ABDOMINAL AORTOGRAM W/LOWER EXTREMITY;  Surgeon: Marty Heck, MD;  Location: [place] [facility];  Service: Cardiovascular;  Laterality: N/A;  . CESAREAN SECTION    . PERIPHERAL VASCULAR INTERVENTION Left 04/11/2019   Procedure: PERIPHERAL VASCULAR INTERVENTION;  Surgeon: Marty Heck, MD;  Location: [place] [facility];  Service: Cardiovascular;  Laterality: Left;  common iliac    History reviewed. No pertinent family history.  SOCIAL HISTORY: Social History   Tobacco Use  . Smoking status: Former Smoker    Packs/day: 0.25    Types: Cigarettes    Last attempt to quit: 03/21/2019    Years since quitting: 0.0  . Smokeless tobacco: Never Used  Substance Use Topics  . Alcohol use: Not Currently    No Known Allergies  Current Outpatient Medications  Medication Sig Dispense Refill  . aspirin EC 81 MG tablet Take 81 mg by mouth daily.    Marland Kitchen atorvastatin (LIPITOR) 20 MG tablet Take 1 tablet (20 mg total) by mouth daily. 30 tablet 11  . clopidogrel (PLAVIX) 75 MG  tablet Take 1 tablet (75 mg total) by mouth daily. 30 tablet 11  . HYDROcodone-acetaminophen (NORCO/VICODIN) 5-325 MG tablet Take 1-2 tablets by mouth every 6 (six) hours as needed for moderate pain or severe pain (1 tablet moderate pain, 2 tablets severe pain). 30 tablet 0  . naproxen sodium (ALEVE) 220 MG tablet Take 220 mg by mouth daily as needed.     No current facility-administered medications for this visit.     REVIEW OF SYSTEMS:  [X]  denotes positive finding, [ ]  denotes negative finding Cardiac  Comments:  Chest pain or chest pressure:    Shortness of breath upon exertion:    Short of breath when lying flat:    Irregular heart rhythm:        Vascular    Pain in calf, thigh, or hip brought on by ambulation:    Pain in feet at night that wakes you up from your sleep:  x Left toes 1, 2, 3, 4 and 5  Blood clot in your veins:    Leg swelling:         Pulmonary    Oxygen at home:    Productive cough:     Wheezing:         Neurologic    Sudden weakness in arms or legs:     Sudden numbness in arms or legs:     Sudden onset of difficulty speaking or slurred speech:    Temporary  loss of vision in one eye:     Problems with dizziness:         Gastrointestinal    Blood in stool:     Vomited blood:         Genitourinary    Burning when urinating:     Blood in urine:        Psychiatric    Major depression:         Hematologic    Bleeding problems:    Problems with blood clotting too easily:        Skin    Rashes or ulcers:        Constitutional    Fever or chills:      PHYSICAL EXAM: Vitals:   04/26/19 1120  BP: (!) 162/86  Pulse: 84  Resp: 16  Temp: (!) 96.9 F (36.1 C)  TempSrc: Oral  SpO2: 98%  Weight: 140 lb (63.5 kg)  Height: 5\' 4"  (1.626 m)    GENERAL: The patient is a well-nourished female, in no acute distress. The vital signs are documented above. CARDIAC: There is a regular rate and rhythm.  VASCULAR:  2+ femoral pulse both groins Left DP  2+ palpable Blue discoloration to left 1st, 2nd, 3rd, 4th and 5th toes as pictured below - some discoloration on plantar surface of foot PULMONARY: There is good air exchange bilaterally without wheezing or rales. ABDOMEN: Soft and non-tender with normal pitched bowel sounds.  MUSCULOSKELETAL: There are no major deformities or cyanosis.          DATA:   None  Assessment/Plan:  58 year old female that presents for ongoing evaluation of left toe pain following left common iliac stent for suspected blue toe syndrome.  Still has a palpable left dorsalis pedis pulse after left common iliac stent placement.  Her toes have remained very painful and she has some increased demarcation as pictured above over the last week.  Discussed in detail with her that options would be likely TMA at this time unless we give her some more time for the toes to demarcate and potentially she could be limited to just toe amputations alone.  She wants to try and save as much of her foot as possible.  I refilled her Norco prescription today given the severity of her pain.  I will see her again in 1 week for ongoing follow-up.   Marty Heck, MD [facility] [place] Office: [phone] Pager: [phone]

## 2019-04-27 ENCOUNTER — Other Ambulatory Visit: Payer: Self-pay

## 2019-04-27 DIAGNOSIS — I739 Peripheral vascular disease, unspecified: Secondary | ICD-10-CM

## 2019-05-03 ENCOUNTER — Other Ambulatory Visit: Payer: Self-pay | Admitting: *Deleted

## 2019-05-03 ENCOUNTER — Other Ambulatory Visit: Payer: Self-pay

## 2019-05-03 ENCOUNTER — Encounter: Payer: Self-pay | Admitting: Vascular Surgery

## 2019-05-03 ENCOUNTER — Encounter: Payer: Self-pay | Admitting: *Deleted

## 2019-05-03 ENCOUNTER — Ambulatory Visit (INDEPENDENT_AMBULATORY_CARE_PROVIDER_SITE_OTHER): Payer: Self-pay | Admitting: Vascular Surgery

## 2019-05-03 VITALS — BP 137/86 | HR 97 | Temp 97.1°F | Resp 14 | Ht 64.0 in | Wt 132.0 lb

## 2019-05-03 DIAGNOSIS — I75022 Atheroembolism of left lower extremity: Secondary | ICD-10-CM

## 2019-05-03 MED ORDER — OXYCODONE-ACETAMINOPHEN 5-325 MG PO TABS: 1.0000 | ORAL_TABLET | ORAL | 0 refills | Status: DC | PRN

## 2019-05-03 NOTE — H&P (View-Only) (Signed)
Patient name: Barbara Watkins MRN: [id number] DOB: September 28, 1961 Sex: female  REASON FOR VISIT: Ongoing left toe pain after left iliac stent for blue toe syndrome  HPI: Barbara Watkins is a 58 y.o. female that presents for ongoing follow-up of left toe pain s/p covered left common iliac stent for blue toe syndrome with atheroemboli on 04/11/19.  Her left toes have been very painful since before the stent procedure.  She feels the pain is severe enough now that she cannot tolerate it anymore.  She has been taking Vicodin.    Past Medical History:  Diagnosis Date  . 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 ABDOMINAL AORTOGRAM W/LOWER EXTREMITY N/A 04/11/2019   Procedure: ABDOMINAL AORTOGRAM W/LOWER EXTREMITY;  Surgeon: Marty Heck, MD;  Location: [place] [facility];  Service: Cardiovascular;  Laterality: N/A;  . CESAREAN SECTION    . PERIPHERAL VASCULAR INTERVENTION Left 04/11/2019   Procedure: PERIPHERAL VASCULAR INTERVENTION;  Surgeon: Marty Heck, MD;  Location: [place] [facility];  Service: Cardiovascular;  Laterality: Left;  common iliac    History reviewed. No pertinent family history.  SOCIAL HISTORY: Social History   Tobacco Use  . Smoking status: Former Smoker    Packs/day: 0.25    Types: Cigarettes    Last attempt to quit: 03/21/2019    Years since quitting: 0.1  . Smokeless tobacco: Never Used  Substance Use Topics  . Alcohol use: Not Currently    No Known Allergies  Current Outpatient Medications  Medication Sig Dispense Refill  . aspirin EC 81 MG tablet Take 81 mg by mouth daily.    Marland Kitchen atorvastatin (LIPITOR) 20 MG tablet Take 1 tablet (20 mg total) by mouth daily. 30 tablet 11  . clopidogrel (PLAVIX) 75 MG tablet Take 1 tablet (75 mg total) by mouth daily. 30 tablet 11  . naproxen sodium (ALEVE) 220 MG tablet Take 220 mg by mouth daily as needed.    Marland Kitchen oxyCODONE-acetaminophen  (PERCOCET) 5-325 MG tablet Take 1-2 tablets by mouth every 4 (four) hours as needed for severe pain. 30 tablet 0   No current facility-administered medications for this visit.     REVIEW OF SYSTEMS:  [X]  denotes positive finding, [ ]  denotes negative finding Cardiac  Comments:  Chest pain or chest pressure:    Shortness of breath upon exertion:    Short of breath when lying flat:    Irregular heart rhythm:        Vascular    Pain in calf, thigh, or hip brought on by ambulation:    Pain in feet at night that wakes you up from your sleep:  x Left toes 1, 2, 3, 4 and 5  Blood clot in your veins:    Leg swelling:         Pulmonary    Oxygen at home:    Productive cough:     Wheezing:         Neurologic    Sudden weakness in arms or legs:     Sudden numbness in arms or legs:     Sudden onset of difficulty speaking or slurred speech:    Temporary loss of vision in one eye:     Problems with dizziness:         Gastrointestinal    Blood in stool:     Vomited blood:  Genitourinary    Burning when urinating:     Blood in urine:        Psychiatric    Major depression:         Hematologic    Bleeding problems:    Problems with blood clotting too easily:        Skin    Rashes or ulcers:        Constitutional    Fever or chills:      PHYSICAL EXAM: Vitals:   05/03/19 1246  BP: 137/86  Pulse: 97  Resp: 14  Temp: (!) 97.1 F (36.2 C)  TempSrc: Oral  SpO2: 98%  Weight: 132 lb (59.9 kg)  Height: 5\' 4"  (1.626 m)    GENERAL: The patient is a well-nourished female, in no acute distress. The vital signs are documented above. CARDIAC: There is a regular rate and rhythm.  VASCULAR:  2+ femoral pulse both groins Left DP 2+ palpable Blue discoloration to left 1st, 2nd, 3rd, 4th and 5th - extends onto dorsum of foot at 1st and 5th toes as pictured last week - stable PULMONARY: There is good air exchange bilaterally without wheezing or rales. ABDOMEN: Soft and  non-tender with normal pitched bowel sounds.  MUSCULOSKELETAL: There are no major deformities or cyanosis.  DATA:   None  Assessment/Plan:  58 year old female that presents for ongoing evaluation of left toe pain following left common iliac stent for suspected blue toe syndrome.  Still has a palpable left dorsalis pedis pulse after left common iliac stent placement.  Her toes have remained very painful and she has some increased demarcation as pictured over the last several weeks.  She feels the pain is too severe and she cannot tolerate it anymore - pain in all toes - 1st and 5th worse.  Will arrange for left TMA on Friday with Dr. Oneida Alar.  We were trying to give it more time to see if demarcation would allow toe amputations alone.  She has some demarcation on plantar surface of foot but hopefully can make flap far enough back and still have decent TMA.  Discussed risk that this could fail to heal.  Will switch her to Percocet for additional pain control in the meantime.     Marty Heck, MD [facility] [place] Office: [phone] Pager: [phone]

## 2019-05-03 NOTE — Progress Notes (Signed)
Patient took Plavix 05/03/2019. Instructed to hold for surgery.

## 2019-05-03 NOTE — Progress Notes (Signed)
Patient name: JAMICE CARRENO MRN: [id number] DOB: [birth year] Sex: female  REASON FOR VISIT: Ongoing left toe pain after left iliac stent for blue toe syndrome  HPI: RIFKA RAMEY is a 58 y.o. female that presents for ongoing follow-up of left toe pain s/p covered left common iliac stent for blue toe syndrome with atheroemboli on 04/11/19.  Her left toes have been very painful since before the stent procedure.  She feels the pain is severe enough now that she cannot tolerate it anymore.  She has been taking Vicodin.    Past Medical History:  Diagnosis Date  . 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 ABDOMINAL AORTOGRAM W/LOWER EXTREMITY N/A 04/11/2019   Procedure: ABDOMINAL AORTOGRAM W/LOWER EXTREMITY;  Surgeon: Marty Heck, MD;  Location: [place] [facility];  Service: Cardiovascular;  Laterality: N/A;  . CESAREAN SECTION    . PERIPHERAL VASCULAR INTERVENTION Left 04/11/2019   Procedure: PERIPHERAL VASCULAR INTERVENTION;  Surgeon: Marty Heck, MD;  Location: [place] [facility];  Service: Cardiovascular;  Laterality: Left;  common iliac    History reviewed. No pertinent family history.  SOCIAL HISTORY: Social History   Tobacco Use  . Smoking status: Former Smoker    Packs/day: 0.25    Types: Cigarettes    Last attempt to quit: 03/21/2019    Years since quitting: 0.1  . Smokeless tobacco: Never Used  Substance Use Topics  . Alcohol use: Not Currently    No Known Allergies  Current Outpatient Medications  Medication Sig Dispense Refill  . aspirin EC 81 MG tablet Take 81 mg by mouth daily.    Marland Kitchen atorvastatin (LIPITOR) 20 MG tablet Take 1 tablet (20 mg total) by mouth daily. 30 tablet 11  . clopidogrel (PLAVIX) 75 MG tablet Take 1 tablet (75 mg total) by mouth daily. 30 tablet 11  . naproxen sodium (ALEVE) 220 MG tablet Take 220 mg by mouth daily as needed.    Marland Kitchen oxyCODONE-acetaminophen  (PERCOCET) 5-325 MG tablet Take 1-2 tablets by mouth every 4 (four) hours as needed for severe pain. 30 tablet 0   No current facility-administered medications for this visit.     REVIEW OF SYSTEMS:  [X]  denotes positive finding, [ ]  denotes negative finding Cardiac  Comments:  Chest pain or chest pressure:    Shortness of breath upon exertion:    Short of breath when lying flat:    Irregular heart rhythm:        Vascular    Pain in calf, thigh, or hip brought on by ambulation:    Pain in feet at night that wakes you up from your sleep:  x Left toes 1, 2, 3, 4 and 5  Blood clot in your veins:    Leg swelling:         Pulmonary    Oxygen at home:    Productive cough:     Wheezing:         Neurologic    Sudden weakness in arms or legs:     Sudden numbness in arms or legs:     Sudden onset of difficulty speaking or slurred speech:    Temporary loss of vision in one eye:     Problems with dizziness:         Gastrointestinal    Blood in stool:     Vomited blood:  Genitourinary    Burning when urinating:     Blood in urine:        Psychiatric    Major depression:         Hematologic    Bleeding problems:    Problems with blood clotting too easily:        Skin    Rashes or ulcers:        Constitutional    Fever or chills:      PHYSICAL EXAM: Vitals:   05/03/19 1246  BP: 137/86  Pulse: 97  Resp: 14  Temp: (!) 97.1 F (36.2 C)  TempSrc: Oral  SpO2: 98%  Weight: 132 lb (59.9 kg)  Height: 5\' 4"  (1.626 m)    GENERAL: The patient is a well-nourished female, in no acute distress. The vital signs are documented above. CARDIAC: There is a regular rate and rhythm.  VASCULAR:  2+ femoral pulse both groins Left DP 2+ palpable Blue discoloration to left 1st, 2nd, 3rd, 4th and 5th - extends onto dorsum of foot at 1st and 5th toes as pictured last week - stable PULMONARY: There is good air exchange bilaterally without wheezing or rales. ABDOMEN: Soft and  non-tender with normal pitched bowel sounds.  MUSCULOSKELETAL: There are no major deformities or cyanosis.  DATA:   None  Assessment/Plan:  58 year old female that presents for ongoing evaluation of left toe pain following left common iliac stent for suspected blue toe syndrome.  Still has a palpable left dorsalis pedis pulse after left common iliac stent placement.  Her toes have remained very painful and she has some increased demarcation as pictured over the last several weeks.  She feels the pain is too severe and she cannot tolerate it anymore - pain in all toes - 1st and 5th worse.  Will arrange for left TMA on Friday with Dr. Oneida Alar.  We were trying to give it more time to see if demarcation would allow toe amputations alone.  She has some demarcation on plantar surface of foot but hopefully can make flap far enough back and still have decent TMA.  Discussed risk that this could fail to heal.  Will switch her to Percocet for additional pain control in the meantime.     Marty Heck, MD [facility] [place] Office: [phone] Pager: [phone]

## 2019-05-05 ENCOUNTER — Other Ambulatory Visit: Payer: Self-pay

## 2019-05-05 ENCOUNTER — Encounter (HOSPITAL_COMMUNITY): Payer: Self-pay | Admitting: *Deleted

## 2019-05-05 NOTE — Progress Notes (Signed)
   HPI: 58 year old female presents the office today for a second opinion regarding left lower extremity pain patient states that her whole left foot hurts.  Her toes turn purple and are swollen.  This is been ongoing for the past 3 weeks.  She also experiences significant amount of itchiness.  Patient had vascular studies and a stent placement approximately 2 weeks ago.  She was diagnosed with blue toe syndrome.  She would like to discuss any additional treatment options.  Past Medical History:  Diagnosis Date  . 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  Dermatology: Skin is cold, dry and supple bilateral lower extremities. Negative for open lesions or macerations.  Blue discoloration noted to the toes 1-5 left lower extremity  Vascular: Palpable pedal pulses bilaterally, however the digits are extremely cold to touch with blue discoloration.  Likely microvascular disease.  Neurological: Epicritic and protective threshold grossly intact bilaterally.   Musculoskeletal Exam: Range of motion within normal limits to all pedal and ankle joints bilateral. Muscle strength 5/5 in all groups bilateral.   Radiographic Exam:  Normal osseous mineralization. Joint spaces preserved. No fracture/dislocation/boney destruction.    Assessment: 1.  Blue toe syndrome left lower extremity 2.  Peripheral vascular disease left lower extremity   Plan of Care:  1. Patient evaluated. X-Rays reviewed.  2.  Today explained to the patient that she would benefit from continuing care with vascular.  There is nothing additional I can provide for the patient regarding her symptoms.  It appears as though she is under active good care by vascular.  Patient may benefit from transmetatarsal amputation if the pain and ischemia to the digits do not improve. 3.  Return to clinic as needed      Edrick Kins, DPM [facility]  Dr. Edrick Kins, DPM    [address],  [address]                Office [phone]  Fax [phone]

## 2019-05-05 NOTE — Progress Notes (Addendum)
Ms Wolff denies chest pain or shortness of breath. Patient denies that she or her family has experienced any of the following: Cough Fever >100.4 Runny Nose Sore Throat Difficulty breathing/ shortness of breath Travel in past 14 days- no  Ms Lun reports that she was given a prescription for Percocet Tuesday, she took 1 and laid down. Patient reports that she feel asleep and woke up 1 hour latter with chest pressure lightheadedness and diaphorsis  Ms Bugge reports that she called nurse at Dr. Eden Lathe office and she instructed her to not take anymore. I informed Dr. Kalman Shan- no new orders.

## 2019-05-06 ENCOUNTER — Encounter (HOSPITAL_COMMUNITY): Payer: Self-pay

## 2019-05-06 ENCOUNTER — Inpatient Hospital Stay (HOSPITAL_COMMUNITY): Payer: Medicaid Other | Admitting: Certified Registered"

## 2019-05-06 ENCOUNTER — Encounter (HOSPITAL_COMMUNITY)
Admission: RE | Disposition: A | Discharge status: Home / Self Care | Payer: Self-pay | Source: Home / Self Care | Attending: Vascular Surgery

## 2019-05-06 ENCOUNTER — Inpatient Hospital Stay (HOSPITAL_COMMUNITY)
Admission: RE | Admit: 2019-05-06 | Discharge: 2019-05-09 | DRG: 240 | Disposition: A | Discharge status: Home / Self Care | Payer: Medicaid Other | Attending: Vascular Surgery | Admitting: Vascular Surgery

## 2019-05-06 DIAGNOSIS — Z79899 Other long term (current) drug therapy: Secondary | ICD-10-CM

## 2019-05-06 DIAGNOSIS — Z7982 Long term (current) use of aspirin: Secondary | ICD-10-CM

## 2019-05-06 DIAGNOSIS — I70261 Atherosclerosis of native arteries of extremities with gangrene, right leg: Secondary | ICD-10-CM

## 2019-05-06 DIAGNOSIS — Z87891 Personal history of nicotine dependence: Secondary | ICD-10-CM

## 2019-05-06 DIAGNOSIS — I96 Gangrene, not elsewhere classified: Secondary | ICD-10-CM | POA: Diagnosis present

## 2019-05-06 DIAGNOSIS — I739 Peripheral vascular disease, unspecified: Secondary | ICD-10-CM | POA: Diagnosis present

## 2019-05-06 DIAGNOSIS — I75022 Atheroembolism of left lower extremity: Principal | ICD-10-CM | POA: Diagnosis present

## 2019-05-06 DIAGNOSIS — Z7902 Long term (current) use of antithrombotics/antiplatelets: Secondary | ICD-10-CM

## 2019-05-06 DIAGNOSIS — Z79891 Long term (current) use of opiate analgesic: Secondary | ICD-10-CM

## 2019-05-06 HISTORY — DX: Peripheral vascular disease, unspecified: I73.9

## 2019-05-06 HISTORY — DX: Gastro-esophageal reflux disease without esophagitis: K21.9

## 2019-05-06 HISTORY — PX: TRANSMETATARSAL AMPUTATION: SHX6197

## 2019-05-06 LAB — CBC
HCT: 36.3 % (ref 36.0–46.0)
Hemoglobin: 10.9 g/dL — ABNORMAL LOW (ref 12.0–15.0)
MCH: 27.3 pg (ref 26.0–34.0)
MCHC: 30 g/dL (ref 30.0–36.0)
MCV: 91 fL (ref 80.0–100.0)
Platelets: 379 10*3/uL (ref 150–400)
RBC: 3.99 MIL/uL (ref 3.87–5.11)
RDW: 13.7 % (ref 11.5–15.5)
WBC: 18.9 10*3/uL — ABNORMAL HIGH (ref 4.0–10.5)
nRBC: 0 % (ref 0.0–0.2)

## 2019-05-06 LAB — BASIC METABOLIC PANEL
Anion gap: 14 (ref 5–15)
BUN: 14 mg/dL (ref 6–20)
CO2: 25 mmol/L (ref 22–32)
Calcium: 10.4 mg/dL — ABNORMAL HIGH (ref 8.9–10.3)
Chloride: 99 mmol/L (ref 98–111)
Creatinine, Ser: 0.79 mg/dL (ref 0.44–1.00)
GFR calc Af Amer: >60 mL/min (ref >60)
GFR calc non Af Amer: >60 mL/min (ref >60)
Glucose, Bld: 109 mg/dL — ABNORMAL HIGH (ref 70–99)
Potassium: 3.8 mmol/L (ref 3.5–5.1)
Sodium: 138 mmol/L (ref 135–145)

## 2019-05-06 SURGERY — AMPUTATION, FOOT, TRANSMETATARSAL
Anesthesia: General | Site: Foot | Laterality: Left

## 2019-05-06 MED ORDER — FENTANYL CITRATE (PF) 250 MCG/5ML IJ SOLN
INTRAMUSCULAR | Status: AC
  Filled 2019-05-06: qty 5

## 2019-05-06 MED ORDER — CEFAZOLIN SODIUM-DEXTROSE 2-4 GM/100ML-% IV SOLN
2.0000 g | Freq: Three times a day (TID) | INTRAVENOUS | Status: AC
  Administered 2019-05-06 – 2019-05-07 (×2): 2 g via INTRAVENOUS
  Filled 2019-05-06 (×2): qty 100

## 2019-05-06 MED ORDER — HYDROMORPHONE HCL 1 MG/ML IJ SOLN
INTRAMUSCULAR | Status: DC | PRN
  Administered 2019-05-06: 0.5 mg via INTRAVENOUS

## 2019-05-06 MED ORDER — LABETALOL HCL 5 MG/ML IV SOLN: 10.0000 mg | INTRAVENOUS | Status: DC | PRN

## 2019-05-06 MED ORDER — ONDANSETRON HCL 4 MG/2ML IJ SOLN: 4.0000 mg | Freq: Four times a day (QID) | INTRAMUSCULAR | Status: DC | PRN

## 2019-05-06 MED ORDER — PROPOFOL 10 MG/ML IV BOLUS
INTRAVENOUS | Status: AC
  Filled 2019-05-06: qty 20

## 2019-05-06 MED ORDER — ALUM & MAG HYDROXIDE-SIMETH 200-200-20 MG/5ML PO SUSP: 15.0000 mL | ORAL | Status: DC | PRN

## 2019-05-06 MED ORDER — CHLORHEXIDINE GLUCONATE 4 % EX LIQD: 60.0000 mL | Freq: Once | CUTANEOUS | Status: DC

## 2019-05-06 MED ORDER — ACETAMINOPHEN 325 MG PO TABS
325.0000 mg | ORAL_TABLET | ORAL | Status: DC | PRN
  Administered 2019-05-07 – 2019-05-08 (×3): 650 mg via ORAL
  Filled 2019-05-06 (×4): qty 2

## 2019-05-06 MED ORDER — 0.9 % SODIUM CHLORIDE (POUR BTL) OPTIME
TOPICAL | Status: DC | PRN
  Administered 2019-05-06: 13:00:00 1000 mL

## 2019-05-06 MED ORDER — SODIUM CHLORIDE 0.9 % IV SOLN
INTRAVENOUS | Status: DC
  Administered 2019-05-06: 18:00:00 via INTRAVENOUS

## 2019-05-06 MED ORDER — HYDROMORPHONE HCL 1 MG/ML IJ SOLN
INTRAMUSCULAR | Status: AC
  Filled 2019-05-06: qty 0.5

## 2019-05-06 MED ORDER — TRAMADOL HCL 50 MG PO TABS
50.0000 mg | ORAL_TABLET | Freq: Four times a day (QID) | ORAL | Status: DC | PRN
  Administered 2019-05-06 – 2019-05-09 (×9): 50 mg via ORAL
  Filled 2019-05-06 (×9): qty 1

## 2019-05-06 MED ORDER — LIDOCAINE HCL (CARDIAC) PF 100 MG/5ML IV SOSY
PREFILLED_SYRINGE | INTRAVENOUS | Status: DC | PRN
  Administered 2019-05-06: 60 mg via INTRAVENOUS

## 2019-05-06 MED ORDER — ASPIRIN EC 81 MG PO TBEC
81.0000 mg | DELAYED_RELEASE_TABLET | Freq: Every day | ORAL | Status: DC
  Administered 2019-05-07 – 2019-05-09 (×3): 81 mg via ORAL
  Filled 2019-05-06 (×3): qty 1

## 2019-05-06 MED ORDER — MORPHINE SULFATE (PF) 2 MG/ML IV SOLN
2.0000 mg | INTRAVENOUS | Status: DC | PRN
  Administered 2019-05-06 – 2019-05-08 (×12): 2 mg via INTRAVENOUS
  Filled 2019-05-06 (×12): qty 1

## 2019-05-06 MED ORDER — FAMOTIDINE 20 MG PO TABS: 10.0000 mg | ORAL_TABLET | ORAL | Status: DC | PRN

## 2019-05-06 MED ORDER — CEFAZOLIN SODIUM-DEXTROSE 2-4 GM/100ML-% IV SOLN
2.0000 g | INTRAVENOUS | Status: AC
  Administered 2019-05-06: 2 g via INTRAVENOUS
  Filled 2019-05-06: qty 100

## 2019-05-06 MED ORDER — PROMETHAZINE HCL 25 MG/ML IJ SOLN: 6.2500 mg | INTRAMUSCULAR | Status: DC | PRN

## 2019-05-06 MED ORDER — POLYETHYLENE GLYCOL 3350 17 G PO PACK
17.0000 g | PACK | Freq: Every day | ORAL | Status: DC | PRN
  Administered 2019-05-08: 17 g via ORAL
  Filled 2019-05-06: qty 1

## 2019-05-06 MED ORDER — ACETAMINOPHEN 325 MG RE SUPP: 325.0000 mg | RECTAL | Status: DC | PRN

## 2019-05-06 MED ORDER — DEXAMETHASONE SODIUM PHOSPHATE 10 MG/ML IJ SOLN
INTRAMUSCULAR | Status: AC
  Filled 2019-05-06: qty 1

## 2019-05-06 MED ORDER — HYDROMORPHONE HCL 1 MG/ML IJ SOLN
0.2500 mg | INTRAMUSCULAR | Status: DC | PRN
  Administered 2019-05-06 (×2): 0.5 mg via INTRAVENOUS

## 2019-05-06 MED ORDER — BISACODYL 10 MG RE SUPP: 10.0000 mg | Freq: Every day | RECTAL | Status: DC | PRN

## 2019-05-06 MED ORDER — LIDOCAINE 2% (20 MG/ML) 5 ML SYRINGE
INTRAMUSCULAR | Status: AC
  Filled 2019-05-06: qty 5

## 2019-05-06 MED ORDER — LACTATED RINGERS IV SOLN
INTRAVENOUS | Status: DC
  Administered 2019-05-06 (×2): via INTRAVENOUS

## 2019-05-06 MED ORDER — FENTANYL CITRATE (PF) 250 MCG/5ML IJ SOLN
INTRAMUSCULAR | Status: DC | PRN
  Administered 2019-05-06: 150 ug via INTRAVENOUS
  Administered 2019-05-06 (×3): 50 ug via INTRAVENOUS
  Administered 2019-05-06: 100 ug via INTRAVENOUS
  Administered 2019-05-06 (×2): 50 ug via INTRAVENOUS

## 2019-05-06 MED ORDER — ATORVASTATIN CALCIUM 10 MG PO TABS
20.0000 mg | ORAL_TABLET | Freq: Every day | ORAL | Status: DC
  Administered 2019-05-07 – 2019-05-09 (×3): 20 mg via ORAL
  Filled 2019-05-06 (×3): qty 2

## 2019-05-06 MED ORDER — BISACODYL 5 MG PO TBEC
10.0000 mg | DELAYED_RELEASE_TABLET | Freq: Every day | ORAL | Status: DC | PRN
  Administered 2019-05-08: 10 mg via ORAL
  Filled 2019-05-06: qty 2

## 2019-05-06 MED ORDER — MIDAZOLAM HCL 2 MG/2ML IJ SOLN
INTRAMUSCULAR | Status: AC
  Filled 2019-05-06: qty 2

## 2019-05-06 MED ORDER — DEXAMETHASONE SODIUM PHOSPHATE 10 MG/ML IJ SOLN
INTRAMUSCULAR | Status: DC | PRN
  Administered 2019-05-06: 10 mg via INTRAVENOUS

## 2019-05-06 MED ORDER — ONDANSETRON HCL 4 MG/2ML IJ SOLN
INTRAMUSCULAR | Status: AC
  Filled 2019-05-06: qty 2

## 2019-05-06 MED ORDER — PROPOFOL 10 MG/ML IV BOLUS
INTRAVENOUS | Status: DC | PRN
  Administered 2019-05-06: 150 mg via INTRAVENOUS

## 2019-05-06 MED ORDER — HYDRALAZINE HCL 20 MG/ML IJ SOLN: 5.0000 mg | INTRAMUSCULAR | Status: DC | PRN

## 2019-05-06 MED ORDER — ONDANSETRON HCL 4 MG/2ML IJ SOLN
INTRAMUSCULAR | Status: DC | PRN
  Administered 2019-05-06: 4 mg via INTRAVENOUS

## 2019-05-06 MED ORDER — HYDROMORPHONE HCL 1 MG/ML IJ SOLN
INTRAMUSCULAR | Status: AC
  Filled 2019-05-06: qty 1

## 2019-05-06 MED ORDER — POTASSIUM CHLORIDE CRYS ER 20 MEQ PO TBCR: 20.0000 meq | EXTENDED_RELEASE_TABLET | Freq: Once | ORAL | Status: DC

## 2019-05-06 MED ORDER — MIDAZOLAM HCL 5 MG/5ML IJ SOLN
INTRAMUSCULAR | Status: DC | PRN
  Administered 2019-05-06: 2 mg via INTRAVENOUS

## 2019-05-06 MED ORDER — SODIUM CHLORIDE 0.9 % IV SOLN: INTRAVENOUS | Status: DC

## 2019-05-06 MED ORDER — PHENOL 1.4 % MT LIQD: 1.0000 | OROMUCOSAL | Status: DC | PRN

## 2019-05-06 MED ORDER — PANTOPRAZOLE SODIUM 40 MG PO TBEC
40.0000 mg | DELAYED_RELEASE_TABLET | Freq: Every day | ORAL | Status: DC
  Administered 2019-05-06 – 2019-05-09 (×4): 40 mg via ORAL
  Filled 2019-05-06 (×4): qty 1

## 2019-05-06 MED ORDER — METOPROLOL TARTRATE 5 MG/5ML IV SOLN: 2.0000 mg | INTRAVENOUS | Status: DC | PRN

## 2019-05-06 MED ORDER — HEPARIN SODIUM (PORCINE) 5000 UNIT/ML IJ SOLN
5000.0000 [IU] | Freq: Three times a day (TID) | INTRAMUSCULAR | Status: DC
  Administered 2019-05-07 – 2019-05-09 (×6): 5000 [IU] via SUBCUTANEOUS
  Filled 2019-05-06 (×6): qty 1

## 2019-05-06 MED ORDER — GUAIFENESIN-DM 100-10 MG/5ML PO SYRP: 15.0000 mL | ORAL_SOLUTION | ORAL | Status: DC | PRN

## 2019-05-06 MED ORDER — CLOPIDOGREL BISULFATE 75 MG PO TABS
75.0000 mg | ORAL_TABLET | Freq: Every day | ORAL | Status: DC
  Administered 2019-05-07 – 2019-05-09 (×3): 75 mg via ORAL
  Filled 2019-05-06 (×3): qty 1

## 2019-05-06 SURGICAL SUPPLY — 32 items
BANDAGE ACE 4X5 VEL STRL LF (GAUZE/BANDAGES/DRESSINGS) ×2 IMPLANT
BLADE CORE FAN STRYKER (BLADE) ×1 IMPLANT
BLADE LONG MED 31X9 (MISCELLANEOUS) ×1 IMPLANT
BNDG GAUZE ELAST 4 BULKY (GAUZE/BANDAGES/DRESSINGS) ×2 IMPLANT
CANISTER SUCT 3000ML PPV (MISCELLANEOUS) ×2 IMPLANT
COVER SURGICAL LIGHT HANDLE (MISCELLANEOUS) ×2 IMPLANT
COVER WAND RF STERILE (DRAPES) ×2 IMPLANT
DRAPE HALF SHEET 40X57 (DRAPES) ×2 IMPLANT
DRSG EMULSION OIL 3X3 NADH (GAUZE/BANDAGES/DRESSINGS) ×4 IMPLANT
ELECT REM PT RETURN 9FT ADLT (ELECTROSURGICAL) ×2
ELECTRODE REM PT RTRN 9FT ADLT (ELECTROSURGICAL) ×1 IMPLANT
GAUZE SPONGE 4X4 12PLY STRL (GAUZE/BANDAGES/DRESSINGS) ×2 IMPLANT
GLOVE BIO SURGEON STRL SZ 6.5 (GLOVE) ×3 IMPLANT
GLOVE BIO SURGEON STRL SZ7.5 (GLOVE) ×2 IMPLANT
GLOVE BIOGEL PI IND STRL 7.0 (GLOVE) IMPLANT
GLOVE BIOGEL PI INDICATOR 7.0 (GLOVE) ×1
GOWN STRL REUS W/ TWL LRG LVL3 (GOWN DISPOSABLE) ×3 IMPLANT
GOWN STRL REUS W/TWL LRG LVL3 (GOWN DISPOSABLE) ×6
KIT BASIN OR (CUSTOM PROCEDURE TRAY) ×2 IMPLANT
KIT TURNOVER KIT B (KITS) ×2 IMPLANT
NS IRRIG 1000ML POUR BTL (IV SOLUTION) ×2 IMPLANT
PACK GENERAL/GYN (CUSTOM PROCEDURE TRAY) ×2 IMPLANT
PAD ARMBOARD 7.5X6 YLW CONV (MISCELLANEOUS) ×4 IMPLANT
SUT ETHILON 2 0 FS 18 (SUTURE) ×8 IMPLANT
SUT ETHILON 3 0 PS 1 (SUTURE) ×2 IMPLANT
SUT VIC AB 3-0 SH 18 (SUTURE) ×1 IMPLANT
SUT VIC AB 3-0 SH 27 (SUTURE) ×2
SUT VIC AB 3-0 SH 27X BRD (SUTURE) ×1 IMPLANT
TOWEL GREEN STERILE (TOWEL DISPOSABLE) ×4 IMPLANT
TOWEL GREEN STERILE FF (TOWEL DISPOSABLE) ×2 IMPLANT
UNDERPAD 30X30 (UNDERPADS AND DIAPERS) ×2 IMPLANT
WATER STERILE IRR 1000ML POUR (IV SOLUTION) ×2 IMPLANT

## 2019-05-06 NOTE — Anesthesia Preprocedure Evaluation (Signed)
Anesthesia Evaluation  Patient identified by MRN, date of birth, ID band Patient awake    Reviewed: Allergy & Precautions, NPO status , Patient's Chart, lab work & pertinent test results  Airway Mallampati: II  TM Distance: >3 FB Neck ROM: Full    Dental no notable dental hx.    Pulmonary neg pulmonary ROS, former smoker,    Pulmonary exam normal breath sounds clear to auscultation       Cardiovascular + Peripheral Vascular Disease  Normal cardiovascular exam Rhythm:Regular Rate:Normal     Neuro/Psych Anxiety Depression negative neurological ROS  negative psych ROS   GI/Hepatic Neg liver ROS, GERD  ,  Endo/Other  negative endocrine ROS  Renal/GU negative Renal ROS  negative genitourinary   Musculoskeletal negative musculoskeletal ROS (+)   Abdominal   Peds negative pediatric ROS (+)  Hematology negative hematology ROS (+)   Anesthesia Other Findings   Reproductive/Obstetrics negative OB ROS                             Anesthesia Physical Anesthesia Plan  ASA: II  Anesthesia Plan: General   Post-op Pain Management:    Induction: Intravenous  PONV Risk Score and Plan: 3 and Ondansetron, Dexamethasone, Midazolam and Treatment may vary due to age or medical condition  Airway Management Planned: LMA  Additional Equipment:   Intra-op Plan:   Post-operative Plan: Extubation in OR  Informed Consent: I have reviewed the patients History and Physical, chart, labs and discussed the procedure including the risks, benefits and alternatives for the proposed anesthesia with the patient or authorized representative who has indicated his/her understanding and acceptance.     Dental advisory given  Plan Discussed with: CRNA  Anesthesia Plan Comments:         Anesthesia Quick Evaluation

## 2019-05-06 NOTE — Interval H&P Note (Signed)
History and Physical Interval Note:  05/06/2019 2:22 PM  Barbara Watkins  has presented today for surgery, with the diagnosis of BLUE TOE SYNDROME LEFT LOWER EXTREMITY.  The various methods of treatment have been discussed with the patient and family. After consideration of risks, benefits and other options for treatment, the patient has consented to  Procedure(s): TRANSMETATARSAL AMPUTATION LEFT FOOT (Left) as a surgical intervention.  The patient's history has been reviewed, patient examined, no change in status, stable for surgery.  I have reviewed the patient's chart and labs.  Questions were answered to the patient's satisfaction.     Ruta Hinds

## 2019-05-06 NOTE — Op Note (Signed)
Procedure: Right foot transmetatarsal amputation  Preoperative diagnosis: Gangrene right foot with rest pain  Postoperative diagnosis: Same  Anesthesia: Gen.  Assistant: Nurse  Specimens: Right forefoot  Operative details: After obtaining informed consent, the patient was taken to the operating room. The patient was placed in supine position on the operating room table. After induction of general anesthesia and placement of a laryngeal mask, the patient's entire right lower extremity was prepped and draped in the usual sterile fashion. Next a transverse incision was made on the right forefoot at the midshaft level of the metatarsals. The incision was then extended longitudinally to create a posterior flap at the level of the phalangeal metatarsal joint. Periosteal elevator was used to raise the periosteum on all 5 metatarsal shafts. These were then divided with a saw. The remainder of the forefoot was removed sharply. The forefoot specimen was passed off to be sent to pathology. Hemostasis was obtained with cautery. There was pulsatile bleeding from digital vessels.  The wound was thoroughly irrigated with normal saline solution. The fascial edges were reapproximated using interrupted 2-0 Vicryl sutures. The skin was closed with alternating 3 0 nylon vertical mattress and simple sutures. There was good bleeding from the skin edge. The patient tolerated the procedure well and there were no complications. Instrument sponge and needle counts were correct at the end of the case. The patient was taken to the recovery room in stable condition.  Ruta Hinds, MD [facility] [place] Office: [phone] Pager: [phone]

## 2019-05-06 NOTE — Anesthesia Procedure Notes (Signed)
Procedure Name: LMA Insertion Date/Time: 05/06/2019 3:25 PM Performed by: Teressa Lower., CRNA Pre-anesthesia Checklist: Patient identified, Emergency Drugs available, Suction available, Patient being monitored and Timeout performed Patient Re-evaluated:Patient Re-evaluated prior to induction Oxygen Delivery Method: Circle system utilized Preoxygenation: Pre-oxygenation with 100% oxygen Induction Type: IV induction LMA: LMA inserted LMA Size: 4.0 Number of attempts: 1 Placement Confirmation: positive ETCO2 and breath sounds checked- equal and bilateral Tube secured with: Tape Dental Injury: Teeth and Oropharynx as per pre-operative assessment

## 2019-05-06 NOTE — Progress Notes (Signed)
Orthopedic Tech Progress Note Patient Details:  Barbara Watkins 1961/03/09 [id number]  Ortho Devices Type of Ortho Device: Darco shoe Ortho Device/Splint Location: left Ortho Device/Splint Interventions: Application   Post Interventions Patient Tolerated: Well Instructions Provided: Care of device   [place] Pink 05/06/2019, 6:18 PM

## 2019-05-06 NOTE — Progress Notes (Signed)
Pt arrived on unit from PACU. CHG bath completed. Tele applied, CCMD notified. L left warm and dry, dressing on L foot clean and intact. Pt oriented to room, call bell and phone within reach. Will continue to monitor.   Rufina Falco, RN BSN 05/06/2019 5:45 PM

## 2019-05-06 NOTE — Transfer of Care (Signed)
Immediate Anesthesia Transfer of Care Note  Patient: Barbara Watkins  Procedure(s) Performed: TRANSMETATARSAL AMPUTATION LEFT FOOT (Left Foot)  Patient Location: PACU  Anesthesia Type:General  Level of Consciousness: awake, alert  and oriented  Airway & Oxygen Therapy: Patient Spontanous Breathing and Patient connected to nasal cannula oxygen  Post-op Assessment: Report given to RN and Post -op Vital signs reviewed and stable  Post vital signs: Reviewed and stable  Last Vitals:  Vitals Value Taken Time  BP 175/88 05/06/2019  4:22 PM  Temp    Pulse 94 05/06/2019  4:25 PM  Resp 13 05/06/2019  4:25 PM  SpO2 98 % 05/06/2019  4:25 PM  Vitals shown include unvalidated device data.  Last Pain:  Vitals:   05/06/19 1211  TempSrc: Oral         Complications: No apparent anesthesia complications

## 2019-05-07 LAB — BASIC METABOLIC PANEL
Anion gap: 11 (ref 5–15)
BUN: 11 mg/dL (ref 6–20)
CO2: 25 mmol/L (ref 22–32)
Calcium: 9.6 mg/dL (ref 8.9–10.3)
Chloride: 102 mmol/L (ref 98–111)
Creatinine, Ser: 0.9 mg/dL (ref 0.44–1.00)
GFR calc Af Amer: >60 mL/min (ref >60)
GFR calc non Af Amer: >60 mL/min (ref >60)
Glucose, Bld: 160 mg/dL — ABNORMAL HIGH (ref 70–99)
Potassium: 3.9 mmol/L (ref 3.5–5.1)
Sodium: 138 mmol/L (ref 135–145)

## 2019-05-07 LAB — CBC
HCT: 28.6 % — ABNORMAL LOW (ref 36.0–46.0)
Hemoglobin: 9.2 g/dL — ABNORMAL LOW (ref 12.0–15.0)
MCH: 27.5 pg (ref 26.0–34.0)
MCHC: 32.2 g/dL (ref 30.0–36.0)
MCV: 85.6 fL (ref 80.0–100.0)
Platelets: 402 10*3/uL — ABNORMAL HIGH (ref 150–400)
RBC: 3.34 MIL/uL — ABNORMAL LOW (ref 3.87–5.11)
RDW: 13.6 % (ref 11.5–15.5)
WBC: 14.9 10*3/uL — ABNORMAL HIGH (ref 4.0–10.5)
nRBC: 0 % (ref 0.0–0.2)

## 2019-05-07 LAB — HIV ANTIBODY (ROUTINE TESTING W REFLEX): HIV Screen 4th Generation wRfx: NONREACTIVE

## 2019-05-07 NOTE — Progress Notes (Addendum)
  Progress Note    05/07/2019 8:40 AM 1 Day Post-Op  Subjective:  Pain in TMA overnight   Vitals:   05/07/19 0400 05/07/19 0800  BP: (!) 117/49 134/77  Pulse: 88 78  Resp: 17 15  Temp: 98.6 F (37 C)   SpO2: 92% 95%   Physical Exam: Lungs:  Non labored Incisions:  L TMA without continued bleeding; skin edges viable Extremities:  Palpable L DP Neurologic: A&O  CBC    Component Value Date/Time   WBC 14.9 (H) 05/07/2019 0237   RBC 3.34 (L) 05/07/2019 0237   HGB 9.2 (L) 05/07/2019 0237   HCT 28.6 (L) 05/07/2019 0237   PLT 402 (H) 05/07/2019 0237   MCV 85.6 05/07/2019 0237   MCH 27.5 05/07/2019 0237   MCHC 32.2 05/07/2019 0237   RDW 13.6 05/07/2019 0237    BMET    Component Value Date/Time   NA 138 05/07/2019 0237   K 3.9 05/07/2019 0237   CL 102 05/07/2019 0237   CO2 25 05/07/2019 0237   GLUCOSE 160 (H) 05/07/2019 0237   BUN 11 05/07/2019 0237   CREATININE 0.90 05/07/2019 0237   CALCIUM 9.6 05/07/2019 0237   GFRNONAA >60 05/07/2019 0237   GFRAA >60 05/07/2019 0237    INR No results found for: INR   Intake/Output Summary (Last 24 hours) at 05/07/2019 0840 Last data filed at 05/07/2019 0400 Gross per 24 hour  Intake 2219.26 ml  Output 15 ml  Net 2204.26 ml     Assessment/Plan:  58 y.o. female is s/p L TMA 1 Day Post-Op   TMA incision unremarkable; re-dressed OOB with darco shoe; PT ordered D/c home when pain controlled and mobility increased   Dagoberto Ligas, PA-C [facility] [phone] 05/07/2019 8:40 AM  I agree with the above.  I have seen and evaluated the patient and agree with the above assessment and plan.  The patient will work with physical therapy today and she will be placed and a Darco shoe.  She will be able to go home when she is comfortable ambulating and her pain is controlled.  I spoke with her daughter this morning and updated her.  At this point, I anticipate discharge home on Monday  [place] Jarrius [place]

## 2019-05-07 NOTE — Evaluation (Signed)
Physical Therapy Evaluation Patient Details Name: Barbara Watkins MRN: [id number] DOB: 1961/09/27 Today's Date: 05/07/2019   History of Present Illness  Barbara Watkins is a 58 y.o. female that presents for ongoing follow-up of left toe pain s/p covered left common iliac stent for blue toe syndrome with atheroemboli on 04/11/19. 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   Clinical Impression  Pt admitted with above. Pt with 8/10 L foot pain and minimal L LE WBing ability. Pt educated on L darco shoe, L heal WBing only with RW. Pt mobility limited by pain. Pt with 2 story section 8 apt however can stay on 1st floor. Acute PT to cont to follow.    Follow Up Recommendations No PT follow up;Supervision for mobility/OOB    Equipment Recommendations  Rolling walker with 5" wheels(tub bench)    Recommendations for Other Services       Precautions / Restrictions Precautions Precautions: Fall Precaution Comments: phantom limb pain Restrictions Weight Bearing Restrictions: Yes LLE Weight Bearing: Weight bearing as tolerated Other Position/Activity Restrictions: through heel only in darco boot      Mobility  Bed Mobility Overal bed mobility: Modified Independent             General bed mobility comments: HOB elevated, able to bring LEs off EOB and bring self to sitting  Transfers Overall transfer level: Needs assistance Equipment used: Rolling walker (2 wheeled) Transfers: Sit to/from Stand Sit to Stand: Min guard         General transfer comment: verbal cues for hand placement and to reach back for chair  Ambulation/Gait Ambulation/Gait assistance: Min guard Gait Distance (Feet): 20 Feet Assistive device: Rolling walker (2 wheeled) Gait Pattern/deviations: Step-to pattern Gait velocity: slow   General Gait Details: had L darco shoe on however due to pain pt unable to tolerate WBing through L LE and hopped on R foot. educated pt on how to properly weight bear through  L heal only in darco shoe however pt unable ot tolerate. educated pt to wear a sneaker on the R foot to balance out leg length  Stairs            Wheelchair Mobility    Modified Rankin (Stroke Patients Only)       Balance Overall balance assessment: Needs assistance Sitting-balance support: No upper extremity supported;Feet supported Sitting balance-Leahy Scale: Good     Standing balance support: Bilateral upper extremity supported Standing balance-Leahy Scale: Poor Standing balance comment: dependent on UE support due ot limited L LE WBing due to pain                             Pertinent Vitals/Pain Pain Assessment: 0-10 Pain Score: 8  Pain Location: L foot Pain Descriptors / Indicators: Burning Pain Intervention(s): Monitored during session    Home Living Family/patient expects to be discharged to:: Private residence Living Arrangements: Children;Other relatives Available Help at Discharge: Available 24 hours/day;Family(lives with 70yo daughter and 29mo old grandson) Type of Home: Apartment Home Access: Level entry     Home Layout: Two level;Able to live on main level with bedroom/bathroom Home Equipment: None      Prior Function Level of Independence: Independent         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 Dominance   Dominant Hand: Right    Extremity/Trunk Assessment   Upper Extremity Assessment Upper Extremity Assessment: Overall WFL for tasks assessed  Lower Extremity Assessment Lower Extremity Assessment: LLE deficits/detail LLE Deficits / Details: hip and knee within functional limits, ankle limited by pain LLE Sensation: (burning)    Cervical / Trunk Assessment Cervical / Trunk Assessment: Normal  Communication   Communication: No difficulties  Cognition Arousal/Alertness: Awake/alert Behavior During Therapy: WFL for tasks assessed/performed Overall Cognitive Status: Within Functional Limits for tasks  assessed                                        General Comments General comments (skin integrity, edema, etc.): L foot in ace wrap, no drainage    Exercises     Assessment/Plan    PT Assessment Patient needs continued PT services  PT Problem List Decreased strength;Decreased balance;Decreased mobility;Decreased knowledge of use of DME;Pain       PT Treatment Interventions DME instruction;Gait training;Stair training;Functional mobility training;Therapeutic activities;Therapeutic exercise;Balance training    PT Goals (Current goals can be found in the Care Plan section)  Acute Rehab PT Goals Patient Stated Goal: stop the pain PT Goal Formulation: With patient Time For Goal Achievement: 05/21/19 Potential to Achieve Goals: Good    Frequency Min 3X/week   Barriers to discharge        Co-evaluation               AM-PAC PT "6 Clicks" Mobility  Outcome Measure Help needed turning from your back to your side while in a flat bed without using bedrails?: None Help needed moving from lying on your back to sitting on the side of a flat bed without using bedrails?: None Help needed moving to and from a bed to a chair (including a wheelchair)?: A Little Help needed standing up from a chair using your arms (e.g., wheelchair or bedside chair)?: A Little Help needed to walk in hospital room?: A Little Help needed climbing 3-5 steps with a railing? : A Little 6 Click Score: 20    End of Session Equipment Utilized During Treatment: Gait belt(L darco shoe) Activity Tolerance: Patient limited by pain Patient left: in chair;with call bell/phone within reach Nurse Communication: Mobility status PT Visit Diagnosis: Unsteadiness on feet (R26.81);Difficulty in walking, not elsewhere classified (R26.2);Pain Pain - Right/Left: Left Pain - part of body: Ankle and joints of foot    Time: 1045-1110 PT Time Calculation (min) (ACUTE ONLY): 25 min   Charges:   PT  Evaluation $PT Eval Moderate Complexity: 1 Mod PT Treatments $Gait Training: 8-22 mins        Kittie Plater, PT, DPT Acute Rehabilitation Services Pager #: [phone] Office #: [phone]   Berline Lopes 05/07/2019, 11:38 AM

## 2019-05-07 NOTE — Progress Notes (Signed)
Patient's daughter would like the physician to call her as soon as possible, today. She can be reached on her work phone, too: [phone]. Thanks.

## 2019-05-08 NOTE — Progress Notes (Signed)
Physical Therapy Treatment Patient Details Name: Barbara Watkins MRN: [id number] DOB: Aug 17, 1961 Today's Date: 05/08/2019    History of Present Illness Barbara Watkins is a 58 y.o. female that presents for ongoing follow-up of left toe pain s/p covered left common iliac stent for blue toe syndrome with atheroemboli on 04/11/19. 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     PT Comments    Session focused on gait training. Pt able to progress to limited left heel weightbearing through darco shoe rather than a hop through pattern. Somewhat limited by nausea this session (pt stating she hadn't eaten). Will continue to progress as tolerated.   Follow Up Recommendations  No PT follow up;Supervision for mobility/OOB     Equipment Recommendations  Rolling walker with 5" wheels(tub bench)    Recommendations for Other Services       Precautions / Restrictions Precautions Precautions: Fall Precaution Comments: phantom limb pain Restrictions Weight Bearing Restrictions: Yes LLE Weight Bearing: Weight bearing as tolerated Other Position/Activity Restrictions: through heel only in darco boot    Mobility  Bed Mobility Overal bed mobility: Modified Independent             General bed mobility comments: HOB elevated, able to bring LEs off EOB and bring self to sitting  Transfers Overall transfer level: Needs assistance Equipment used: Rolling walker (2 wheeled) Transfers: Sit to/from Stand Sit to Stand: Supervision            Ambulation/Gait Ambulation/Gait assistance: Min guard Gait Distance (Feet): 20 Feet Assistive device: Rolling walker (2 wheeled) Gait Pattern/deviations: Step-through pattern;Antalgic;Decreased step length - right Gait velocity: slow   General Gait Details: cues for weightbearing status, sequencing, decreased left step length. moderate reliance through arms on walker   Stairs             Wheelchair Mobility    Modified Rankin (Stroke  Patients Only)       Balance Overall balance assessment: Needs assistance Sitting-balance support: No upper extremity supported;Feet supported Sitting balance-Leahy Scale: Good     Standing balance support: Bilateral upper extremity supported Standing balance-Leahy Scale: Poor Standing balance comment: dependent on UE support due ot limited L LE WBing due to pain                            Cognition Arousal/Alertness: Awake/alert Behavior During Therapy: WFL for tasks assessed/performed Overall Cognitive Status: Within Functional Limits for tasks assessed                                        Exercises      General Comments        Pertinent Vitals/Pain Pain Assessment: Faces Faces Pain Scale: Hurts even more Pain Location: L foot Pain Descriptors / Indicators: Burning Pain Intervention(s): Monitored during session;Limited activity within patient's tolerance    Home Living                      Prior Function            PT Goals (current goals can now be found in the care plan section) Acute Rehab PT Goals Patient Stated Goal: stop the pain PT Goal Formulation: With patient Time For Goal Achievement: 05/21/19 Potential to Achieve Goals: Good Progress towards PT goals: Progressing toward goals    Frequency  Min 3X/week      PT Plan Current plan remains appropriate    Co-evaluation              AM-PAC PT "6 Clicks" Mobility   Outcome Measure  Help needed turning from your back to your side while in a flat bed without using bedrails?: None Help needed moving from lying on your back to sitting on the side of a flat bed without using bedrails?: None Help needed moving to and from a bed to a chair (including a wheelchair)?: A Little Help needed standing up from a chair using your arms (e.g., wheelchair or bedside chair)?: A Little Help needed to walk in hospital room?: A Little Help needed climbing 3-5 steps  with a railing? : A Little 6 Click Score: 20    End of Session Equipment Utilized During Treatment: (L darco shoe) Activity Tolerance: Patient limited by pain Patient left: in chair;with call bell/phone within reach Nurse Communication: Mobility status PT Visit Diagnosis: Unsteadiness on feet (R26.81);Difficulty in walking, not elsewhere classified (R26.2);Pain Pain - Right/Left: Left Pain - part of body: Ankle and joints of foot     Time: 4210-3128 PT Time Calculation (min) (ACUTE ONLY): 15 min  Charges:  $Gait Training: 8-22 mins                    Ellamae Sia, [place], DPT Acute Rehabilitation Services Pager [phone] Office [phone]    Willy Eddy 05/08/2019, 4:04 PM

## 2019-05-08 NOTE — Progress Notes (Addendum)
  Progress Note    05/08/2019 9:39 AM 2 Days Post-Op  Subjective:  No complaints   Vitals:   05/08/19 0330 05/08/19 0810  BP: (!) 121/56 116/72  Pulse: 78 79  Resp: (!) 22 19  Temp: 98.6 F (37 C) 98.2 F (36.8 C)  SpO2: 96% 96%   Physical Exam: Lungs:  Non labored Incisions:  L TMA incision healing well, no drainage, skin edges viable Extremities:  Palpable L DP pulse Neurologic: A&O  CBC    Component Value Date/Time   WBC 14.9 (H) 05/07/2019 0237   RBC 3.34 (L) 05/07/2019 0237   HGB 9.2 (L) 05/07/2019 0237   HCT 28.6 (L) 05/07/2019 0237   PLT 402 (H) 05/07/2019 0237   MCV 85.6 05/07/2019 0237   MCH 27.5 05/07/2019 0237   MCHC 32.2 05/07/2019 0237   RDW 13.6 05/07/2019 0237    BMET    Component Value Date/Time   NA 138 05/07/2019 0237   K 3.9 05/07/2019 0237   CL 102 05/07/2019 0237   CO2 25 05/07/2019 0237   GLUCOSE 160 (H) 05/07/2019 0237   BUN 11 05/07/2019 0237   CREATININE 0.90 05/07/2019 0237   CALCIUM 9.6 05/07/2019 0237   GFRNONAA >60 05/07/2019 0237   GFRAA >60 05/07/2019 0237    INR No results found for: INR   Intake/Output Summary (Last 24 hours) at 05/08/2019 0100 Last data filed at 05/08/2019 0100 Gross per 24 hour  Intake 300 ml  Output 400 ml  Net -100 ml     Assessment/Plan:  58 y.o. female is s/p L TMA 2 Days Post-Op   Perfusing L foot well, palpable DP pulse OOB with darco shoe Probable discharge home tomorrow   Dagoberto Ligas, PA-C [facility] [phone] 05/08/2019 9:39 AM  I agree with the above.  I have seen and examined the patient.  Hopefully home tomorrow.  Annamarie Major

## 2019-05-09 ENCOUNTER — Encounter (HOSPITAL_COMMUNITY): Payer: Self-pay | Admitting: Vascular Surgery

## 2019-05-09 MED ORDER — TRAMADOL HCL 50 MG PO TABS: 50.0000 mg | ORAL_TABLET | Freq: Four times a day (QID) | ORAL | 0 refills | Status: DC | PRN

## 2019-05-09 NOTE — TOC Transition Note (Signed)
Transition of Care [facility]) - CM/SW Discharge Note Marvetta Gibbons RN, BSN Transitions of Care Unit 4E- RN Case Manager [phone]   Patient Details  Name: Barbara Watkins MRN: [id number] Date of Birth: 26-Feb-1961  Transition of Care [facility]) CM/SW Contact:  Dawayne Patricia, RN Phone Number: 05/09/2019, 11:23 AM   Clinical Narrative:    Pt stable for transition home today, CM spoke with pt over TC regarding DME needs- pt would like both RW and tub bench for home- call made to [facility] with Christine for DME needs- however pt only has Medicaid Family planning which will not cover DME- per Thedore Mins pt did not want to pay out of pocket cost.    Final next level of care: Home/Self Care Barriers to Discharge: No Barriers Identified   Patient Goals and CMS Choice Patient states their goals for this hospitalization and ongoing recovery are:: "to go home" CMS Medicare.gov Compare Post Acute Care list provided to:: Patient Choice offered to / list presented to : NA  Discharge Placement  to transition home.                     Discharge Plan and Services In-house Referral: NA Discharge Planning Services: CM Consult Post Acute Care Choice: Durable Medical Equipment          DME Arranged: Walker rolling, Tub bench DME Agency: [employer] Date DME Agency Contacted: 05/09/19 Time DME Agency Contacted: [phone] Representative spoke with at DME Agency: Andree Coss HH Arranged: NA [place] Agency: NA        Social Determinants of Health ([place]) Interventions     Readmission Risk Interventions Readmission Risk Prevention Plan 05/09/2019  Post Dischage Appt Complete  Medication Screening Complete  Transportation Screening Complete  Some recent data might be hidden

## 2019-05-09 NOTE — Discharge Summary (Signed)
[facility] Discharge Summary   Patient ID:  Barbara Watkins MRN: [id number] DOB/AGE: 1961-06-15 58 y.o.  Admit date: 05/06/2019 Discharge date: 05/09/2019 Date of Surgery: 05/06/2019 Surgeon: Surgeon(s): Fields, Jessy Oto, MD  Admission Diagnosis: BLUE TOE SYNDROME LEFT LOWER EXTREMITY  Discharge Diagnoses:  BLUE TOE SYNDROME LEFT LOWER EXTREMITY  Secondary Diagnoses: Past Medical History:  Diagnosis Date  . 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 GERD (gastroesophageal reflux disease)   . Peripheral vascular disease ([place])     Procedure(s): TRANSMETATARSAL AMPUTATION LEFT FOOT  Discharged Condition: stable  HPI:  Barbara Watkins is a 58 y.o. female that presents for ongoing follow-up of left toe pain s/p covered left common iliac stent for blue toe syndrome with atheroemboli on 04/11/19.  Her left toes have been very painful since before the stent procedure.  She feels the pain is severe enough now that she cannot tolerate it anymore in all toes. She was scheduled for TMA.  Hospital Course:  Barbara Watkins is a 58 y.o. female is S/P Left Procedure(s): TRANSMETATARSAL AMPUTATION LEFT FOOT  TMA incision is healing well and she has a palpable DP pulse.  She continues to have pain that is throbbing in nature.   The pain is controlled with PO tramadol.  She is heel weight bearing in a Darco shoe.  A rolling walker and tub bench was ordered for home use prior to discharge home.     Significant Diagnostic Studies: CBC Lab Results  Component Value Date   WBC 14.9 (H) 05/07/2019   HGB 9.2 (L) 05/07/2019   HCT 28.6 (L) 05/07/2019   MCV 85.6 05/07/2019   PLT 402 (H) 05/07/2019    BMET    Component Value Date/Time   NA 138 05/07/2019 0237   K 3.9 05/07/2019 0237   CL 102 05/07/2019 0237   CO2 25 05/07/2019 0237   GLUCOSE 160 (H) 05/07/2019 0237   BUN 11 05/07/2019 0237   CREATININE 0.90 05/07/2019 0237   CALCIUM 9.6 05/07/2019 0237   GFRNONAA >60 05/07/2019 0237   GFRAA >60 05/07/2019 0237   COAG No results found for: INR, PROTIME   Disposition:  Discharge to :Home Discharge Instructions    Call MD for:  redness, tenderness, or signs of infection (pain, swelling, bleeding, redness, odor or green/yellow discharge around incision site)   Complete by:  As directed    Call MD for:  severe or increased pain, loss or decreased feeling  in affected limb(s)   Complete by:  As directed    Call MD for:  temperature >100.5   Complete by:  As directed    Discharge instructions   Complete by:  As directed    You may shower dry dressing as needed or sock with heel walking shoe.   Resume previous diet   Complete by:  As directed      Allergies as of 05/09/2019      Reactions   Percocet [oxycodone-acetaminophen] Other (See Comments)   Chest pressure and back, diaphoretic, lightheaded      Medication List    TAKE these medications   aspirin EC 81 MG tablet Take 81 mg by mouth daily.   atorvastatin 20 MG tablet Commonly known as:  Lipitor Take 1 tablet (20 mg total) by mouth daily.   bisacodyl 5 MG EC tablet Commonly known as:  DULCOLAX Take 10 mg by mouth daily as needed for moderate constipation.   clopidogrel 75 MG  tablet Commonly known as:  Plavix Take 1 tablet (75 mg total) by mouth daily.   famotidine 10 MG tablet Commonly known as:  PEPCID Take 10 mg by mouth as needed for heartburn or indigestion.   naproxen sodium 220 MG tablet Commonly known as:  ALEVE Take 220 mg by mouth daily as needed (pain).   traMADol 50 MG tablet Commonly known as:  ULTRAM Take 1 tablet (50 mg total) by mouth every 6 (six) hours as needed for moderate pain.            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 Wound care per Discharge AVS Follow-up Information    Elam Dutch, MD Follow up in 4 week(s).   Specialties:  Vascular Surgery, Cardiology Contact information: [place] [address] [phone]        [facility] Follow up.   Why:  rolling walker and tub bench arranged- to be delivered to room prior to discharge          Signed: Roxy Horseman 05/09/2019, 1:59 PM

## 2019-05-09 NOTE — Progress Notes (Signed)
05/09/2019 11:01 AM Discharge AVS meds taken today and those due this evening reviewed.  Follow-up appointments and when to call md reviewed.  D/C IV and TELE.  Questions and concerns addressed.   D/C home per orders. [place]

## 2019-05-09 NOTE — Progress Notes (Signed)
[facility] [place]  Subjective  - Bouts of pain come and go.   Objective 133/69 90 98.6 F (37 C) (Oral) (!) 28 97%  Intake/Output Summary (Last 24 hours) at 05/09/2019 0719 Last data filed at 05/08/2019 1739 Gross per 24 hour  Intake 480 ml  Output 800 ml  Net -320 ml    Right TMA incision healing well.  Dry dressing changed.  Palpable DP pulse Heart RRR Lungs non labored breathing  Assessment/Planning: POD # 3 58 y.o. female is s/p L TMA Pain control issues continue, if controlled she may be discharged today Darco shoe for heel weight bearing F/u with Dr. Oneida Alar in 4 weeks    Roxy Horseman 05/09/2019 7:19 AM --  Laboratory Lab Results: Recent Labs    05/06/19 1244 05/07/19 0237  WBC 18.9* 14.9*  HGB 10.9* 9.2*  HCT 36.3 28.6*  PLT 379 402*   BMET Recent Labs    05/06/19 1244 05/07/19 0237  NA 138 138  K 3.8 3.9  CL 99 102  CO2 25 25  GLUCOSE 109* 160*  BUN 14 11  CREATININE 0.79 0.90  CALCIUM 10.4* 9.6    COAG No results found for: INR, PROTIME No results found for: PTT

## 2019-05-09 NOTE — Progress Notes (Signed)
Physical Therapy Treatment Patient Details Name: Barbara Watkins MRN: [id number] DOB: November 22, 1961 Today's Date: 05/09/2019    History of Present Illness Barbara Watkins is a 58 y.o. female that presents for ongoing follow-up of left toe pain s/p covered left common iliac stent for blue toe syndrome with atheroemboli on 04/11/19. 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     PT Comments    Pt admitted with above diagnosis. Pt currently with functional limitations due to the deficits listed below (see PT Problem List). Pt was able to ambulate with RW needing min assist for safety as she did have 1 LOB needing assist.  Daughter to assist at home.  Pt given gait belt to take home with her.  Pt will need equipment as below.   Pt will benefit from skilled PT to increase their independence and safety with mobility to allow discharge to the venue listed below.     Follow Up Recommendations  Supervision for mobility/OOB;Home health PT(safety eval)     Equipment Recommendations  Rolling walker with 5" wheels;3in1 (PT)(tub bench)    Recommendations for Other Services       Precautions / Restrictions Precautions Precautions: Fall Precaution Comments: phantom limb pain Restrictions Weight Bearing Restrictions: Yes LLE Weight Bearing: Weight bearing as tolerated Other Position/Activity Restrictions: through heel only in darco boot    Mobility  Bed Mobility Overal bed mobility: Modified Independent             General bed mobility comments: HOB elevated, able to bring LEs off EOB and bring self to sitting  Transfers Overall transfer level: Needs assistance Equipment used: Rolling walker (2 wheeled) Transfers: Sit to/from Stand Sit to Stand: Supervision         General transfer comment: verbal cues for hand placement and to reach back for chair  Ambulation/Gait Ambulation/Gait assistance: Min guard;Min assist Gait Distance (Feet): 40 Feet Assistive device: Rolling walker (2  wheeled) Gait Pattern/deviations: Antalgic;Decreased step length - right;Step-to pattern;Decreased stance time - left;Decreased weight shift to left Gait velocity: slow Gait velocity interpretation: <1.31 ft/sec, indicative of household ambulator General Gait Details: cues for weightbearing status, sequencing, decreased left step length. moderate reliance through arms on walker.  Pt with one signficant LOB needing min assist.  Also poorer safety awareness as she was in pain and she needed cues for proper use of RW and for hand placement with transitions.  Discussed safety with RW with pt and she states daughter will be with her at home.  Issued pt gait belt and she understands for daughter to use it at home. Also needed cues for pt proximity to RW>    Chief Strategy Officer    Modified Rankin (Stroke Patients Only)       Balance Overall balance assessment: Needs assistance Sitting-balance support: No upper extremity supported;Feet supported Sitting balance-Leahy Scale: Good     Standing balance support: Bilateral upper extremity supported Standing balance-Leahy Scale: Poor Standing balance comment: dependent on UE support due ot limited L LE WBing due to pain                            Cognition Arousal/Alertness: Awake/alert Behavior During Therapy: WFL for tasks assessed/performed Overall Cognitive Status: Within Functional Limits for tasks assessed  Exercises      General Comments General comments (skin integrity, edema, etc.): Pt needed assist to don Darco shoe left foot      Pertinent Vitals/Pain Pain Assessment: Faces Faces Pain Scale: Hurts whole lot Pain Location: L foot Pain Descriptors / Indicators: Burning Pain Intervention(s): Limited activity within patient's tolerance;Monitored during session;Repositioned;Patient requesting pain meds-RN notified    Home Living                       Prior Function            PT Goals (current goals can now be found in the care plan section) Acute Rehab PT Goals Patient Stated Goal: stop the pain Progress towards PT goals: Progressing toward goals    Frequency    Min 3X/week      PT Plan Discharge plan needs to be updated    Co-evaluation              AM-PAC PT "6 Clicks" Mobility   Outcome Measure  Help needed turning from your back to your side while in a flat bed without using bedrails?: None Help needed moving from lying on your back to sitting on the side of a flat bed without using bedrails?: None Help needed moving to and from a bed to a chair (including a wheelchair)?: A Little Help needed standing up from a chair using your arms (e.g., wheelchair or bedside chair)?: A Little Help needed to walk in hospital room?: A Little Help needed climbing 3-5 steps with a railing? : A Little 6 Click Score: 20    End of Session Equipment Utilized During Treatment: Gait belt(L darco shoe) Activity Tolerance: Patient limited by pain;Patient limited by fatigue Patient left: in chair;with call bell/phone within reach Nurse Communication: Mobility status;Patient requests pain meds PT Visit Diagnosis: Unsteadiness on feet (R26.81);Difficulty in walking, not elsewhere classified (R26.2);Pain Pain - Right/Left: Left Pain - part of body: Ankle and joints of foot     Time: 7628-3151 PT Time Calculation (min) (ACUTE ONLY): 18 min  Charges:  $Gait Training: 8-22 mins                     Starr Pager:  [phone]  Office:  [place] 05/09/2019, 10:51 AM

## 2019-05-10 ENCOUNTER — Telehealth: Payer: Self-pay | Admitting: Vascular Surgery

## 2019-05-10 NOTE — Telephone Encounter (Signed)
appt spk to pt mld ltr 05/31/2019 8am [place] 9am ABI 1010am p/o MD

## 2019-05-10 NOTE — Telephone Encounter (Signed)
-----   Message from Ulyses Amor, [place] sent at 05/09/2019  7:24 AM EDT -----  S/P right TMA f/u with Dr. Oneida Alar in 4 weeks

## 2019-05-24 ENCOUNTER — Encounter (HOSPITAL_COMMUNITY): Payer: Self-pay

## 2019-05-24 ENCOUNTER — Encounter: Payer: Self-pay | Admitting: Family

## 2019-05-24 ENCOUNTER — Ambulatory Visit (HOSPITAL_COMMUNITY): Payer: Self-pay

## 2019-05-24 NOTE — Anesthesia Postprocedure Evaluation (Signed)
Anesthesia Post Note  Patient: Barbara Watkins  Procedure(s) Performed: TRANSMETATARSAL AMPUTATION LEFT FOOT (Left Foot)     Patient location during evaluation: PACU Anesthesia Type: General Level of consciousness: awake and alert Pain management: pain level controlled Vital Signs Assessment: post-procedure vital signs reviewed and stable Respiratory status: spontaneous breathing, nonlabored ventilation and respiratory function stable Cardiovascular status: blood pressure returned to baseline and stable Postop Assessment: no apparent nausea or vomiting Anesthetic complications: no    Last Vitals:  Vitals:   05/08/19 1954 05/09/19 0458  BP:  133/69  Pulse:  90  Resp: 15 (!) 28  Temp: 36.7 C 37 C  SpO2: 96% 97%    Last Pain:  Vitals:   05/09/19 1055  TempSrc:   PainSc: 0-No pain                 Lynda Rainwater

## 2019-05-26 ENCOUNTER — Encounter (HOSPITAL_COMMUNITY): Payer: Self-pay

## 2019-05-26 ENCOUNTER — Encounter: Payer: Self-pay | Admitting: Family

## 2019-05-30 ENCOUNTER — Telehealth (HOSPITAL_COMMUNITY): Payer: Self-pay | Admitting: Rehabilitation

## 2019-05-30 NOTE — Telephone Encounter (Signed)

## 2019-05-31 ENCOUNTER — Ambulatory Visit (INDEPENDENT_AMBULATORY_CARE_PROVIDER_SITE_OTHER)
Admission: RE | Admit: 2019-05-31 | Discharge: 2019-05-31 | Disposition: A | Discharge status: Home / Self Care | Payer: Medicaid Other | Source: Ambulatory Visit | Attending: Family | Admitting: Family

## 2019-05-31 ENCOUNTER — Ambulatory Visit (HOSPITAL_COMMUNITY)
Admission: RE | Admit: 2019-05-31 | Discharge: 2019-05-31 | Disposition: A | Discharge status: Home / Self Care | Payer: Self-pay | Source: Ambulatory Visit | Attending: Family | Admitting: Family

## 2019-05-31 ENCOUNTER — Ambulatory Visit (INDEPENDENT_AMBULATORY_CARE_PROVIDER_SITE_OTHER): Payer: Self-pay | Admitting: Vascular Surgery

## 2019-05-31 ENCOUNTER — Encounter: Payer: Self-pay | Admitting: Vascular Surgery

## 2019-05-31 ENCOUNTER — Other Ambulatory Visit: Payer: Self-pay

## 2019-05-31 VITALS — BP 122/80 | HR 113 | Temp 98.8°F | Resp 16 | Ht 64.0 in

## 2019-05-31 DIAGNOSIS — I75022 Atheroembolism of left lower extremity: Secondary | ICD-10-CM

## 2019-05-31 DIAGNOSIS — I739 Peripheral vascular disease, unspecified: Secondary | ICD-10-CM

## 2019-05-31 NOTE — Progress Notes (Signed)
Patient name: Barbara Watkins MRN: [id number] DOB: 1961/06/27 Sex: female  REASON FOR VISIT: Post-op after Left TMA and Left iliac stent for blue toe syndrome  HPI: Barbara Watkins is a 58 y.o. female that presents for post op check after left TMA.  She initially underwent left common iliac stent for blue toe syndrome with atheroemboli on 04/11/19.  She then underwent L TMA on 05/06/19 with Dr. Oneida Alar.  States all her pain has resolved since TMA.  Very happy with her progress.  No drainage from incision.  Past Medical History:  Diagnosis Date  . 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 GERD (gastroesophageal reflux disease)   . Peripheral vascular disease [facility])     Past Surgical History:  Procedure Laterality Date  . ABDOMINAL AORTOGRAM W/LOWER EXTREMITY N/A 04/11/2019   Procedure: ABDOMINAL AORTOGRAM W/LOWER EXTREMITY;  Surgeon: Marty Heck, MD;  Location: [place] [facility];  Service: Cardiovascular;  Laterality: N/A;  . CESAREAN SECTION    . PERIPHERAL VASCULAR INTERVENTION Left 04/11/2019   Procedure: PERIPHERAL VASCULAR INTERVENTION;  Surgeon: Marty Heck, MD;  Location: [place] [facility];  Service: Cardiovascular;  Laterality: Left;  common iliac  . TRANSMETATARSAL AMPUTATION Left 05/06/2019   Procedure: TRANSMETATARSAL AMPUTATION LEFT FOOT;  Surgeon: Elam Dutch, MD;  [facility];  Service: Vascular;  Laterality: Left;    Family History  Family history unknown: Yes    SOCIAL HISTORY: Social History   Tobacco Use  . Smoking status: Former Smoker    Packs/day: 0.25    Types: Cigarettes    Last attempt to quit: 03/21/2019    Years since quitting: 0.1  . Smokeless tobacco: Never Used  Substance Use Topics  . Alcohol use: Not Currently    Allergies  Allergen Reactions  . Percocet [Oxycodone-Acetaminophen] Other (See Comments)    Chest pressure and back, diaphoretic, lightheaded    Current Outpatient Medications  Medication Sig  Dispense Refill  . aspirin EC 81 MG tablet Take 81 mg by mouth daily.    Marland Kitchen atorvastatin (LIPITOR) 20 MG tablet Take 1 tablet (20 mg total) by mouth daily. 30 tablet 11  . bisacodyl (DULCOLAX) 5 MG EC tablet Take 10 mg by mouth daily as needed for moderate constipation.    . clopidogrel (PLAVIX) 75 MG tablet Take 1 tablet (75 mg total) by mouth daily. 30 tablet 11  . famotidine (PEPCID) 10 MG tablet Take 10 mg by mouth as needed for heartburn or indigestion.     . naproxen sodium (ALEVE) 220 MG tablet Take 220 mg by mouth daily as needed (pain).     . traMADol (ULTRAM) 50 MG tablet Take 1 tablet (50 mg total) by mouth every 6 (six) hours as needed for moderate pain. 30 tablet 0   No current facility-administered medications for this visit.     REVIEW OF SYSTEMS:  [X]  denotes positive finding, [ ]  denotes negative finding Cardiac  Comments:  Chest pain or chest pressure:    Shortness of breath upon exertion:    Short of breath when lying flat:    Irregular heart rhythm:        Vascular    Pain in calf, thigh, or hip brought on by ambulation:    Pain in feet at night that wakes you up from your sleep:     Blood clot in your veins:    Leg swelling:         Pulmonary  Oxygen at home:    Productive cough:     Wheezing:         Neurologic    Sudden weakness in arms or legs:     Sudden numbness in arms or legs:     Sudden onset of difficulty speaking or slurred speech:    Temporary loss of vision in one eye:     Problems with dizziness:         Gastrointestinal    Blood in stool:     Vomited blood:         Genitourinary    Burning when urinating:     Blood in urine:        Psychiatric    Major depression:         Hematologic    Bleeding problems:    Problems with blood clotting too easily:        Skin    Rashes or ulcers:        Constitutional    Fever or chills:      PHYSICAL EXAM: Vitals:   05/31/19 0906  BP: 122/80  Pulse: (!) 113  Resp: 16  Temp: 98.8  F (37.1 C)  TempSrc: Temporal  SpO2: 97%  Height: 5\' 4"  (1.626 m)    GENERAL: The patient is a well-nourished female, in no acute distress. The vital signs are documented above. CARDIAC: There is a regular rate and rhythm.  VASCULAR:  2+ femoral pulse both groins Left DP signal brisk L TMA c/d/i with sutures, no drainge, no cellulitis DATA:   None  Assessment/Plan:  58 year old female that presents for postop check after left TMA on 05/06/2019 by Dr. Oneida Alar.  I previously performed a left common iliac covered stent for atheroemboli from blue toe syndrome.  TMA looks great today and there is no drainage or cellulitis and appears to be healing.  We will remove her sutures.  Discussed I will plan to see her back in 6 months for ongoing surveillance of her left common iliac stent.  Marty Heck, MD [facility] Office: [phone] Pager: [place]

## 2019-06-09 ENCOUNTER — Encounter: Payer: Medicaid Other | Admitting: Vascular Surgery

## 2019-06-12 ENCOUNTER — Inpatient Hospital Stay (HOSPITAL_COMMUNITY)
Admission: EM | Admit: 2019-06-12 | Discharge: 2019-06-15 | DRG: 840 | Disposition: A | Discharge status: Home / Self Care | Payer: Self-pay | Attending: Internal Medicine | Admitting: Internal Medicine

## 2019-06-12 ENCOUNTER — Emergency Department (HOSPITAL_COMMUNITY): Payer: Self-pay

## 2019-06-12 ENCOUNTER — Encounter (HOSPITAL_COMMUNITY): Payer: Self-pay | Admitting: Student

## 2019-06-12 DIAGNOSIS — C7971 Secondary malignant neoplasm of right adrenal gland: Secondary | ICD-10-CM | POA: Diagnosis present

## 2019-06-12 DIAGNOSIS — K219 Gastro-esophageal reflux disease without esophagitis: Secondary | ICD-10-CM | POA: Diagnosis present

## 2019-06-12 DIAGNOSIS — F329 Major depressive disorder, single episode, unspecified: Secondary | ICD-10-CM | POA: Diagnosis present

## 2019-06-12 DIAGNOSIS — R110 Nausea: Secondary | ICD-10-CM

## 2019-06-12 DIAGNOSIS — R109 Unspecified abdominal pain: Secondary | ICD-10-CM

## 2019-06-12 DIAGNOSIS — C786 Secondary malignant neoplasm of retroperitoneum and peritoneum: Secondary | ICD-10-CM | POA: Diagnosis present

## 2019-06-12 DIAGNOSIS — D649 Anemia, unspecified: Secondary | ICD-10-CM | POA: Diagnosis present

## 2019-06-12 DIAGNOSIS — E279 Disorder of adrenal gland, unspecified: Secondary | ICD-10-CM

## 2019-06-12 DIAGNOSIS — I75022 Atheroembolism of left lower extremity: Secondary | ICD-10-CM | POA: Diagnosis present

## 2019-06-12 DIAGNOSIS — Z885 Allergy status to narcotic agent status: Secondary | ICD-10-CM

## 2019-06-12 DIAGNOSIS — B373 Candidiasis of vulva and vagina: Secondary | ICD-10-CM | POA: Diagnosis present

## 2019-06-12 DIAGNOSIS — R319 Hematuria, unspecified: Secondary | ICD-10-CM | POA: Diagnosis present

## 2019-06-12 DIAGNOSIS — Z809 Family history of malignant neoplasm, unspecified: Secondary | ICD-10-CM

## 2019-06-12 DIAGNOSIS — Z79899 Other long term (current) drug therapy: Secondary | ICD-10-CM

## 2019-06-12 DIAGNOSIS — C787 Secondary malignant neoplasm of liver and intrahepatic bile duct: Secondary | ICD-10-CM | POA: Diagnosis present

## 2019-06-12 DIAGNOSIS — Z7982 Long term (current) use of aspirin: Secondary | ICD-10-CM

## 2019-06-12 DIAGNOSIS — K669 Disorder of peritoneum, unspecified: Secondary | ICD-10-CM

## 2019-06-12 DIAGNOSIS — I739 Peripheral vascular disease, unspecified: Secondary | ICD-10-CM | POA: Diagnosis present

## 2019-06-12 DIAGNOSIS — R160 Hepatomegaly, not elsewhere classified: Secondary | ICD-10-CM

## 2019-06-12 DIAGNOSIS — R630 Anorexia: Secondary | ICD-10-CM | POA: Diagnosis present

## 2019-06-12 DIAGNOSIS — Z7902 Long term (current) use of antithrombotics/antiplatelets: Secondary | ICD-10-CM

## 2019-06-12 DIAGNOSIS — R000 Tachycardia, unspecified: Secondary | ICD-10-CM | POA: Diagnosis present

## 2019-06-12 DIAGNOSIS — J9811 Atelectasis: Secondary | ICD-10-CM | POA: Diagnosis present

## 2019-06-12 DIAGNOSIS — C771 Secondary and unspecified malignant neoplasm of intrathoracic lymph nodes: Principal | ICD-10-CM | POA: Diagnosis present

## 2019-06-12 DIAGNOSIS — R918 Other nonspecific abnormal finding of lung field: Secondary | ICD-10-CM | POA: Diagnosis present

## 2019-06-12 DIAGNOSIS — Z1159 Encounter for screening for other viral diseases: Secondary | ICD-10-CM

## 2019-06-12 DIAGNOSIS — C7800 Secondary malignant neoplasm of unspecified lung: Secondary | ICD-10-CM | POA: Diagnosis present

## 2019-06-12 DIAGNOSIS — R809 Proteinuria, unspecified: Secondary | ICD-10-CM | POA: Diagnosis present

## 2019-06-12 DIAGNOSIS — K5900 Constipation, unspecified: Secondary | ICD-10-CM

## 2019-06-12 DIAGNOSIS — C799 Secondary malignant neoplasm of unspecified site: Secondary | ICD-10-CM | POA: Diagnosis present

## 2019-06-12 DIAGNOSIS — C801 Malignant (primary) neoplasm, unspecified: Secondary | ICD-10-CM | POA: Diagnosis present

## 2019-06-12 DIAGNOSIS — N2889 Other specified disorders of kidney and ureter: Secondary | ICD-10-CM | POA: Diagnosis present

## 2019-06-12 DIAGNOSIS — Z87891 Personal history of nicotine dependence: Secondary | ICD-10-CM

## 2019-06-12 DIAGNOSIS — I2510 Atherosclerotic heart disease of native coronary artery without angina pectoris: Secondary | ICD-10-CM | POA: Diagnosis present

## 2019-06-12 DIAGNOSIS — N12 Tubulo-interstitial nephritis, not specified as acute or chronic: Secondary | ICD-10-CM

## 2019-06-12 DIAGNOSIS — R591 Generalized enlarged lymph nodes: Secondary | ICD-10-CM | POA: Diagnosis present

## 2019-06-12 DIAGNOSIS — Z89422 Acquired absence of other left toe(s): Secondary | ICD-10-CM

## 2019-06-12 DIAGNOSIS — F439 Reaction to severe stress, unspecified: Secondary | ICD-10-CM | POA: Diagnosis present

## 2019-06-12 DIAGNOSIS — R112 Nausea with vomiting, unspecified: Secondary | ICD-10-CM | POA: Diagnosis present

## 2019-06-12 DIAGNOSIS — N10 Acute tubulo-interstitial nephritis: Secondary | ICD-10-CM | POA: Diagnosis present

## 2019-06-12 DIAGNOSIS — N151 Renal and perinephric abscess: Secondary | ICD-10-CM | POA: Diagnosis present

## 2019-06-12 LAB — CBC WITH DIFFERENTIAL/PLATELET
Abs Immature Granulocytes: 0.11 10*3/uL — ABNORMAL HIGH (ref 0.00–0.07)
Basophils Absolute: 0.1 10*3/uL (ref 0.0–0.1)
Basophils Relative: 1 %
Eosinophils Absolute: 3.4 10*3/uL — ABNORMAL HIGH (ref 0.0–0.5)
Eosinophils Relative: 20 %
HCT: 35.5 % — ABNORMAL LOW (ref 36.0–46.0)
Hemoglobin: 11 g/dL — ABNORMAL LOW (ref 12.0–15.0)
Immature Granulocytes: 1 %
Lymphocytes Relative: 9 %
Lymphs Abs: 1.5 10*3/uL (ref 0.7–4.0)
MCH: 25.5 pg — ABNORMAL LOW (ref 26.0–34.0)
MCHC: 31 g/dL (ref 30.0–36.0)
MCV: 82.4 fL (ref 80.0–100.0)
Monocytes Absolute: 1.1 10*3/uL — ABNORMAL HIGH (ref 0.1–1.0)
Monocytes Relative: 7 %
Neutro Abs: 10.9 10*3/uL — ABNORMAL HIGH (ref 1.7–7.7)
Neutrophils Relative %: 62 %
Platelets: 306 10*3/uL (ref 150–400)
RBC: 4.31 MIL/uL (ref 3.87–5.11)
RDW: 14 % (ref 11.5–15.5)
WBC: 17.1 10*3/uL — ABNORMAL HIGH (ref 4.0–10.5)
nRBC: 0 % (ref 0.0–0.2)

## 2019-06-12 LAB — URINALYSIS, ROUTINE W REFLEX MICROSCOPIC
Bilirubin Urine: NEGATIVE
Glucose, UA: NEGATIVE mg/dL
Ketones, ur: NEGATIVE mg/dL
Leukocytes,Ua: NEGATIVE
Nitrite: NEGATIVE
Protein, ur: 100 mg/dL — AB
Specific Gravity, Urine: >1.046 — ABNORMAL HIGH (ref 1.005–1.030)
pH: 6 (ref 5.0–8.0)

## 2019-06-12 LAB — COMPREHENSIVE METABOLIC PANEL
ALT: 18 U/L (ref 0–44)
AST: 23 U/L (ref 15–41)
Albumin: 2.9 g/dL — ABNORMAL LOW (ref 3.5–5.0)
Alkaline Phosphatase: 183 U/L — ABNORMAL HIGH (ref 38–126)
Anion gap: 10 (ref 5–15)
BUN: 11 mg/dL (ref 6–20)
CO2: 24 mmol/L (ref 22–32)
Calcium: 9.9 mg/dL (ref 8.9–10.3)
Chloride: 101 mmol/L (ref 98–111)
Creatinine, Ser: 0.75 mg/dL (ref 0.44–1.00)
GFR calc Af Amer: >60 mL/min (ref >60)
GFR calc non Af Amer: >60 mL/min (ref >60)
Glucose, Bld: 135 mg/dL — ABNORMAL HIGH (ref 70–99)
Potassium: 3.7 mmol/L (ref 3.5–5.1)
Sodium: 135 mmol/L (ref 135–145)
Total Bilirubin: 0.4 mg/dL (ref 0.3–1.2)
Total Protein: 6.5 g/dL (ref 6.5–8.1)

## 2019-06-12 LAB — SARS CORONAVIRUS 2: SARS Coronavirus 2: NOT DETECTED

## 2019-06-12 LAB — ABO/RH: ABO/RH(D): O POS

## 2019-06-12 LAB — LIPASE, BLOOD: Lipase: 20 U/L (ref 11–51)

## 2019-06-12 MED ORDER — SENNOSIDES-DOCUSATE SODIUM 8.6-50 MG PO TABS: 1.0000 | ORAL_TABLET | Freq: Every evening | ORAL | Status: DC | PRN

## 2019-06-12 MED ORDER — IOHEXOL 300 MG/ML  SOLN
100.0000 mL | Freq: Once | INTRAMUSCULAR | Status: AC | PRN
  Administered 2019-06-12: 16:00:00 100 mL via INTRAVENOUS

## 2019-06-12 MED ORDER — FENTANYL CITRATE (PF) 100 MCG/2ML IJ SOLN
50.0000 ug | Freq: Once | INTRAMUSCULAR | Status: AC
  Administered 2019-06-12: 50 ug via INTRAVENOUS
  Filled 2019-06-12: qty 2

## 2019-06-12 MED ORDER — SODIUM CHLORIDE 0.9 % IV BOLUS
500.0000 mL | Freq: Once | INTRAVENOUS | Status: AC
  Administered 2019-06-12: 500 mL via INTRAVENOUS

## 2019-06-12 MED ORDER — SODIUM CHLORIDE 0.9 % IV SOLN
1.0000 g | Freq: Once | INTRAVENOUS | Status: AC
  Administered 2019-06-12: 1 g via INTRAVENOUS
  Filled 2019-06-12: qty 10

## 2019-06-12 MED ORDER — ONDANSETRON HCL 4 MG/2ML IJ SOLN
4.0000 mg | Freq: Once | INTRAMUSCULAR | Status: AC
  Administered 2019-06-12: 15:00:00 4 mg via INTRAVENOUS
  Filled 2019-06-12: qty 2

## 2019-06-12 MED ORDER — SODIUM CHLORIDE 0.9 % IV SOLN
1.0000 g | INTRAVENOUS | Status: DC
  Administered 2019-06-13 – 2019-06-14 (×2): 1 g via INTRAVENOUS
  Filled 2019-06-12 (×4): qty 10

## 2019-06-12 MED ORDER — PROMETHAZINE HCL 25 MG PO TABS
12.5000 mg | ORAL_TABLET | Freq: Four times a day (QID) | ORAL | Status: DC | PRN
  Administered 2019-06-14 (×2): 12.5 mg via ORAL
  Filled 2019-06-12 (×2): qty 1

## 2019-06-12 MED ORDER — CLOPIDOGREL BISULFATE 75 MG PO TABS: 75.0000 mg | ORAL_TABLET | Freq: Every day | ORAL | Status: DC

## 2019-06-12 MED ORDER — HEPARIN SODIUM (PORCINE) 5000 UNIT/ML IJ SOLN
5000.0000 [IU] | Freq: Three times a day (TID) | INTRAMUSCULAR | Status: DC
  Administered 2019-06-12 – 2019-06-13 (×2): 5000 [IU] via SUBCUTANEOUS
  Filled 2019-06-12 (×2): qty 1

## 2019-06-12 MED ORDER — ACETAMINOPHEN 325 MG PO TABS
650.0000 mg | ORAL_TABLET | Freq: Four times a day (QID) | ORAL | Status: DC | PRN
  Administered 2019-06-12 – 2019-06-15 (×5): 650 mg via ORAL
  Filled 2019-06-12 (×6): qty 2

## 2019-06-12 MED ORDER — ASPIRIN EC 81 MG PO TBEC
81.0000 mg | DELAYED_RELEASE_TABLET | Freq: Every day | ORAL | Status: DC
  Administered 2019-06-13 – 2019-06-14 (×2): 81 mg via ORAL
  Filled 2019-06-12 (×2): qty 1

## 2019-06-12 NOTE — ED Notes (Signed)
ED TO INPATIENT HANDOFF REPORT  ED Nurse Name and Phone #:  [phone] Threasa Beards, [place] Name/Age/Gender Barbara Watkins 58 y.o. female Room/Bed: 023C/023C  Code Status   Code Status: Full Code  Home/SNF/Other Home Patient oriented to: self, place, time and situation Is this baseline? Yes   Triage Complete: Triage complete  Chief Complaint rt side pain  Triage Note Pt here from home with c/o right side flank pain times 2 weeks worse today , no history of kidney stones    Allergies Allergies  Allergen Reactions  . Percocet [Oxycodone-Acetaminophen] Other (See Comments)    Chest pressure and back, diaphoretic, lightheaded    Level of Care/Admitting Diagnosis ED Disposition    ED Disposition Condition [place] [facility] Area: [place] [[id number]]  Level of Care: Telemetry Medical [104]  Covid Evaluation: Screening Protocol (No Symptoms)  Diagnosis: Metastatic cancer [facility]) [[id number]]  Admitting Physician: Aldine Contes [phone]  Attending Physician: Aldine Contes [[id number]]  PT Class (Do Not Modify): Observation [104]  PT Acc Code (Do Not Modify): Observation [10022]       B Medical/Surgery History Past Medical History:  Diagnosis Date  . 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 GERD (gastroesophageal reflux disease)   . Peripheral vascular disease [facility])    Past Surgical History:  Procedure Laterality Date  . ABDOMINAL AORTOGRAM W/LOWER EXTREMITY N/A 04/11/2019   Procedure: ABDOMINAL AORTOGRAM W/LOWER EXTREMITY;  Surgeon: Marty Heck, MD;  Location: [place] [facility];  Service: Cardiovascular;  Laterality: N/A;  . CESAREAN SECTION    . PERIPHERAL VASCULAR INTERVENTION Left 04/11/2019   Procedure: PERIPHERAL VASCULAR INTERVENTION;  Surgeon: Marty Heck, MD;  Location: [place] [facility];  Service: Cardiovascular;  Laterality: Left;  common iliac  . TRANSMETATARSAL AMPUTATION Left 05/06/2019   Procedure:  TRANSMETATARSAL AMPUTATION LEFT FOOT;  Surgeon: Elam Dutch, MD;  Location: [facility] OR;  Service: Vascular;  Laterality: Left;     A IV Location/Drains/Wounds Patient Lines/Drains/Airways Status   Active Line/Drains/Airways    Name:   Placement date:   Placement time:   Site:   Days:   Peripheral IV 06/12/19 Left Forearm   06/12/19    1504    Forearm   less than 1   Incision (Closed) 05/06/19 Foot Left   05/06/19    1531     37          Intake/Output Last 24 hours No intake or output data in the 24 hours ending 06/12/19 1908  Labs/Imaging Results for orders placed or performed during the hospital encounter of 06/12/19 (from the past 48 hour(s))  CBC with Differential     Status: Abnormal   Collection Time: 06/12/19  2:22 PM  Result Value Ref Range   WBC 17.1 (H) 4.0 - 10.5 K/uL   RBC 4.31 3.87 - 5.11 MIL/uL   Hemoglobin 11.0 (L) 12.0 - 15.0 g/dL   HCT 35.5 (L) 36.0 - 46.0 %   MCV 82.4 80.0 - 100.0 fL   MCH 25.5 (L) 26.0 - 34.0 pg   MCHC 31.0 30.0 - 36.0 g/dL   RDW 14.0 11.5 - 15.5 %   Platelets 306 150 - 400 K/uL   nRBC 0.0 0.0 - 0.2 %   Neutrophils Relative % 62 %   Neutro Abs 10.9 (H) 1.7 - 7.7 K/uL   Lymphocytes Relative 9 %   Lymphs Abs 1.5 0.7 - 4.0 K/uL   Monocytes Relative  7 %   Monocytes Absolute 1.1 (H) 0.1 - 1.0 K/uL   Eosinophils Relative 20 %   Eosinophils Absolute 3.4 (H) 0.0 - 0.5 K/uL   Basophils Relative 1 %   Basophils Absolute 0.1 0.0 - 0.1 K/uL   Immature Granulocytes 1 %   Abs Immature Granulocytes 0.11 (H) 0.00 - 0.07 K/uL    Comment: Performed at [facility] [address] [place], [place] [address]  Comprehensive metabolic panel     Status: Abnormal   Collection Time: 06/12/19  2:22 PM  Result Value Ref Range   Sodium 135 135 - 145 mmol/L   Potassium 3.7 3.5 - 5.1 mmol/L   Chloride 101 98 - 111 mmol/L   CO2 24 22 - 32 mmol/L   Glucose, Bld 135 (H) 70 - 99 mg/dL   BUN 11 6 - 20 mg/dL   Creatinine, Ser 0.75 0.44 - 1.00 mg/dL    Calcium 9.9 8.9 - 10.3 mg/dL   Total Protein 6.5 6.5 - 8.1 g/dL   Albumin 2.9 (L) 3.5 - 5.0 g/dL   AST 23 15 - 41 U/L   ALT 18 0 - 44 U/L   Alkaline Phosphatase 183 (H) 38 - 126 U/L   Total Bilirubin 0.4 0.3 - 1.2 mg/dL   GFR calc non Af Amer >60 >60 mL/min   GFR calc Af Amer >60 >60 mL/min   Anion gap 10 5 - 15    Comment: Performed at [facility] [address], [place], [place] [address]  Lipase, blood     Status: None   Collection Time: 06/12/19  2:22 PM  Result Value Ref Range   Lipase 20 11 - 51 U/L    Comment: Performed at [facility], [place] [address] [place], [place] [address]  Urinalysis, Routine w reflex microscopic     Status: Abnormal   Collection Time: 06/12/19  5:12 PM  Result Value Ref Range   Color, Urine YELLOW YELLOW   APPearance CLEAR CLEAR   Specific Gravity, Urine >1.046 (H) 1.005 - 1.030   pH 6.0 5.0 - 8.0   Glucose, UA NEGATIVE NEGATIVE mg/dL   Hgb urine dipstick SMALL (A) NEGATIVE   Bilirubin Urine NEGATIVE NEGATIVE   Ketones, ur NEGATIVE NEGATIVE mg/dL   Protein, ur 100 (A) NEGATIVE mg/dL   Nitrite NEGATIVE NEGATIVE   Leukocytes,Ua NEGATIVE NEGATIVE   RBC / HPF 11-20 0 - 5 RBC/hpf   WBC, UA 6-10 0 - 5 WBC/hpf   Bacteria, UA RARE (A) NONE SEEN   Squamous Epithelial / LPF 0-5 0 - 5   Mucus PRESENT     Comment: Performed at [facility], [address] [place], [place] [address]   Ct Abdomen Pelvis W Contrast  Result Date: 06/12/2019 CLINICAL DATA:  Right flank pain over the past 2 weeks but worsening today EXAM: CT ABDOMEN AND PELVIS WITH CONTRAST TECHNIQUE: Multidetector CT imaging of the abdomen and pelvis was performed using the standard protocol following bolus administration of intravenous contrast. CONTRAST:  171mL OMNIPAQUE IOHEXOL 300 MG/ML  SOLN COMPARISON:  None. FINDINGS: Lower chest: On the scout image, there is fullness in the left hilum. The possibility of a hilar mass or hilar adenopathy is raised. 3 mm right lower  lobe nodule on image 1/4, partially imaged. 1.0 by 0.6 by 0.8 cm (average diameter 0.8 cm, volume = 0.3 cm^3) lobe pulmonary nodule along the inferior pulmonary ligament, image 12/4. There are 2 small  right middle lobe nodules in the 3-4 mm range the small subpleural nodule or lymph node along the major fissure. Hepatobiliary: 11.8 by 10.0 by 9.3 cm rim enhancing mass in the left hepatic lobe, image 23/3. Dilated left hepatic ducts versus less likely thrombosed vessels distal to this mass. Hypoenhancement in the left hepatic lobe possibly from tumor vascularity. Gallbladder unremarkable. Pancreas: Unremarkable Spleen: Unremarkable Adrenals/Urinary Tract: 5.0 by 3.0 by 4.7 cm nonspecific mass of the right adrenal gland somewhat flattens the IVC, and is not easily separable from the adjacent 3.7 by 3.3 by 4.3 cm region of adenopathy which surrounds the right renal artery and part of the IVC. The left adrenal gland appears normal. Hypoenhancing 5.9 by 5.6 by 5.4 cm right renal mass on image 38/3. There is hypoenhancement of the parenchyma of the right kidney upper pole. 0.6 cm focal hypodensity within this hypoenhancing parenchyma on image 33/3. The right renal vein is narrowed by the adenopathy and adrenal mass. Posterior to the right kidney lower pole, a soft tissue density nodule in the perirenal space measures 1.3 cm in diameter on image 49/3. Posterior to the left kidney lower pole, a 0.8 cm soft tissue density nodule is shown on image 34/3. A nonspecific 1.0 cm hypodense lesion in the left kidney upper pole a separate 0.9 by 0.7 cm left mid kidney lesion is not entirely specific but more likely to be a cyst. Stomach/Bowel: Unremarkable Vascular/Lymphatic: A lymph node or tumor deposit adjacent to the caudate lobe the upper portion of the porta hepatis measures 2.7 by 2.1 cm on image 22/3. Other abnormal porta hepatis lymph nodes are present. As noted previously there is a conglomerate lymph node surrounding the  right renal artery, right renal vein, and part of the IVC. An aortocaval node measures 1.5 cm in short axis on image 39/3. Reproductive: Unremarkable Other: A soft tissue density right omental nodule measures 1.1 by 0.9 cm on image 53/3. Musculoskeletal: Lumbar spondylosis and degenerative disc disease at L4-5 and L5-S1. IMPRESSION: 1. Scattered soft tissue masses compatible with metastatic malignancy. A dominant tumor mass is in the left hepatic lobe measuring 11.8 cm. There is 5.9 cm mass in the right kidney, a right adrenal mass, prominent retroperitoneal and porta hepatis adenopathy, and tumor nodules in the perirenal spaces, right omentum, and probably in the left kidney upper pole. In addition on the scout image there seems to be fullness of the left hilum suspicious for left hilar mass, and small nodules at the right lung base. Given the constellation of findings I would tend to favor lung cancer as the primary, although other entities such as hepatocellular carcinoma, metastatic renal cell carcinoma, cholangiocarcinoma, or melanoma could have a similar distribution and appearance. CT of the chest to further characterize is recommended. Tissue diagnosis recommended. 2. Adjacent to the right renal mass in the upper pole there is abnormal parenchymal hypodensity. Given the right flank pain, this is probably from pyelonephritis. Also the right renal vein is considerably narrowed and its possible that this low density is related to a venous drainage issue, although pyelonephritis is favored and might be predisposed by the tumor causing some narrowing of the subtending infundibula. There is a 6 mm hypodense focus in the right kidney upper pole, and renal abscess is not readily excluded. 3. Mildly dilated lateral segment left hepatic lobe ducts distal to the tumor, probably from obstruction due to the dominant left hepatic lobe mass. These results were called by telephone at the time of interpretation  on 06/12/2019  at 4:34 pm to Benedetto Goad, [place], who verbally acknowledged these results. Electronically Signed   By: Van Clines M.D.   On: 06/12/2019 16:36    Pending Labs Unresulted Labs (From admission, onward)    Start     Ordered   06/13/19 0500  CBC  Tomorrow morning,   R     06/12/19 1801   06/13/19 0175  Basic metabolic panel  Tomorrow morning,   R     06/12/19 1801   06/12/19 1630  Urine culture  ONCE - STAT,   STAT     06/12/19 1631   06/12/19 1422  Pathologist smear review  Once,   STAT     06/12/19 1422          Vitals/Pain Today's Vitals   06/12/19 1638 06/12/19 1645 06/12/19 1700 06/12/19 1715  BP:  136/77 (!) 149/86 (!) 144/81  Pulse:  83 93 99  Resp:      Temp:      TempSrc:      SpO2:  99% 99% 98%  PainSc: 6        Isolation Precautions No active isolations  Medications Medications  aspirin EC tablet 81 mg (has no administration in time range)  clopidogrel (PLAVIX) tablet 75 mg (has no administration in time range)  heparin injection 5,000 Units (has no administration in time range)  senna-docusate (Senokot-S) tablet 1 tablet (has no administration in time range)  promethazine (PHENERGAN) tablet 12.5 mg (has no administration in time range)  fentaNYL (SUBLIMAZE) injection 50 mcg (50 mcg Intravenous Given 06/12/19 1504)  ondansetron (ZOFRAN) injection 4 mg (4 mg Intravenous Given 06/12/19 1504)  sodium chloride 0.9 % bolus 500 mL (500 mLs Intravenous New Bag/Given 06/12/19 1510)  iohexol (OMNIPAQUE) 300 MG/ML solution 100 mL (100 mLs Intravenous Contrast Given 06/12/19 1536)  cefTRIAXone (ROCEPHIN) 1 g in sodium chloride 0.9 % 100 mL IVPB (1 g Intravenous New Bag/Given 06/12/19 1738)  fentaNYL (SUBLIMAZE) injection 50 mcg (50 mcg Intravenous Given 06/12/19 1733)    Mobility walks     Focused Assessments Renal Assessment Handoff:  R Recommendations: See Admitting Provider Note  Report given to:   Additional Notes:

## 2019-06-12 NOTE — H&P (Signed)
Date: 06/12/2019               Patient Name:  Barbara Watkins MRN: [id number]  DOB: 1961/11/15 Age / Sex: 58 y.o., female   PCP: Patient, No Pcp Per         Medical Service: Internal Medicine Teaching Service         Attending Physician: Dr. Aldine Contes, MD    First Contact: Dr. Alfonse Spruce Pager: [phone]  Second Contact: Dr. Maricela Bo Pager: [phone]       After Hours (After 5p/  First Contact Pager: [phone]  weekends / holidays): Second Contact Pager: [phone]   Chief Complaint: Right flank pain  History of Present Illness: [name] is a 58 y.o female with peripheral artery disease s/p transmetatarsal amputation, blue toe syndrome, major depressive disorder who presented with right flank.   Barbara Watkins reports a 3-week history of intermittent 8/10 flank pain which last for several hours.  She describes it as an alternating sharp and dull pain which is only alleviated with lying on the left side and is aggravated by lying on her back.  She noted associated nausea and decreased appetite.  She also has dysuria, increased frequency, and urgency.  She denies fever, chills, hematuria, changes in bowel movements, and dyspnea.  In ED, the patient was only tachycardic to 103.  Otherwise vital signs were unremarkable.  Are significant for leukocytosis of 17.1, normocytic anemia with hemoglobin 11, elevated alk phos 183, urinalysis with hematuria proteinuria, rare bacteria. LFTs, lipase, and total bili within normal limits.  CT abdomen showed scattered soft tissue masses in left hepatic lobe, right kidney, and the right adrenal gland.  Fullness of the left hilum suspicious for left hilar mass and small nodules at the right lung base.  Also noted is signs of right pyelonephritis.   Meds:  Current Meds  Medication Sig  . aspirin EC 81 MG tablet Take 81 mg by mouth daily.  Marland Kitchen atorvastatin (LIPITOR) 20 MG tablet Take 1 tablet (20 mg total) by mouth daily.  . bisacodyl (DULCOLAX) 5  MG EC tablet Take 10 mg by mouth daily as needed (to move the bowels).   . clopidogrel (PLAVIX) 75 MG tablet Take 1 tablet (75 mg total) by mouth daily.  . famotidine (PEPCID) 10 MG tablet Take 10 mg by mouth 2 (two) times daily as needed for heartburn or indigestion.   . naproxen sodium (ALEVE) 220 MG tablet Take 220-440 mg by mouth 2 (two) times daily as needed (for pain).   . traMADol (ULTRAM) 50 MG tablet Take 1 tablet (50 mg total) by mouth every 6 (six) hours as needed for moderate pain.     Allergies: Allergies as of 06/12/2019 - Review Complete 06/12/2019  Allergen Reaction Noted  . Percocet [oxycodone-acetaminophen] Other (See Comments) 05/05/2019   Past Medical History:  Diagnosis Date  . 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 GERD (gastroesophageal reflux disease)   . Peripheral vascular disease ([place])     Family History:  Family History  Family history unknown: Yes    Social History: Used to work in [place] had to take leave because of her peripheral vascular disease and s/p left transmetatarsal amputation.  She smoked 5 to 6 cigarettes since her 44s and quit in March after her left transmetatarsal amputation.  She denies alcohol and illicit drug use.  Used to work in dry cleaning place  Review of Systems: A complete ROS was  negative except as per HPI.   Physical Exam: Blood pressure (!) 144/81, pulse 99, temperature 98.4 F (36.9 C), temperature source Oral, resp. rate 17, SpO2 98 %. General: Lying in bed in no acute distress Neuro: Alert and oriented Cardiovascular: Normal rate, regular rhythm Respiratory: Clear to auscultation bilaterally, normal work of breathing, oxygen saturation on room air Abdomen: No tenderness to palpation, soft, no mass appreciated GU: No costovertebral tenderness bilaterally MSK: No lower extremity mid, erythema, or warmth noted bilaterally. Right s/p transmetatarsal amputation. Skin: Warm and dry  CT abdomen:  IMPRESSION: 1. Scattered soft tissue masses compatible with metastatic malignancy. A dominant tumor mass is in the left hepatic lobe measuring 11.8 cm. There is 5.9 cm mass in the right kidney, a right adrenal mass, prominent retroperitoneal and porta hepatis adenopathy, and tumor nodules in the perirenal spaces, right omentum, and probably in the left kidney upper pole. In addition on the scout image there seems to be fullness of the left hilum suspicious for left hilar mass, and small nodules at the right lung base. Given the constellation of findings I would tend to favor lung cancer as the primary, although other entities such as hepatocellular carcinoma, metastatic renal cell carcinoma, cholangiocarcinoma, or melanoma could have a similar distribution and appearance. CT of the chest to further characterize is recommended. Tissue diagnosis recommended. 2. Adjacent to the right renal mass in the upper pole there is abnormal parenchymal hypodensity. Given the right flank pain, this is probably from pyelonephritis. Also the right renal vein is considerably narrowed and its possible that this low density is related to a venous drainage issue, although pyelonephritis is favored and might be predisposed by the tumor causing some narrowing of the subtending infundibula. There is a 6 mm hypodense focus in the right kidney upper pole, and renal abscess is not readily excluded. 3. Mildly dilated lateral segment left hepatic lobe ducts distal to the tumor, probably from obstruction due to the dominant left hepatic lobe mass.  Assessment & Plan by Problem: Active Problems:   Metastatic cancer ([place])  Barbara Watkins is a 58 y.o f with pad s/p amputation who presented with right flank pain, dysuria, leukocytosis, hematuria, and bacteriuria distant with pyelonephritis.  She was also found to have incidental finding of metastatic masses in renal, adrenal, pulmonary, and peritoneal regions.    Pyelonephritis: - Ceftriaxone 1 g every 24 hours - Follow-up urine cultures - Tylenol 650 mg every 6 hours PRN  Metastatic masses in renal, adrenal, pulmonary, and peritoneal regions: - Will plan on OT oncology in the morning  Peripheral vascular disease: -Continue Plavix and aspirin daily  FEN/GI: Regular diet DVT prophylaxis: Heparin CODE STATUS: Full  Dispo: Admit patient to Observation with expected length of stay less than 2 midnights.  Signed: Carroll Sage, MD 06/12/2019, 6:41 PM  Pager: Pager: [phone]

## 2019-06-12 NOTE — ED Provider Notes (Signed)
Care assumed from [facility], please see her note for full details, but in brief Barbara Watkins is a 58 y.o. female presents with right-sided back flank and abdominal pain which has been intermittent, with associated nausea, no fevers patient also reports some urinary symptoms.  On exam she is tender over the right side but also has right upper quadrant tenderness.  Labs so far significant for leukocytosis of 17.1 and slight elevation in alk phos, labs otherwise reassuring.  UA pending as well as CT abdomen pelvis.  Fluids and pain medication provided.  Plan: Will follow-up on urinalysis and CT  Physical Exam  BP 122/86   Pulse (!) 115   Temp 98.4 F (36.9 C) (Oral)   Resp 17   SpO2 98%   Physical Exam Vitals signs and nursing note reviewed.  Constitutional:      General: She is not in acute distress.    Appearance: She is well-developed. She is not diaphoretic.     Comments: Sitting comfortably in bed in no acute distress.  HENT:     Head: Normocephalic and atraumatic.  Eyes:     General:        Right eye: No discharge.        Left eye: No discharge.  Pulmonary:     Effort: Pulmonary effort is normal. No respiratory distress.  Skin:    General: Skin is warm and dry.  Neurological:     Mental Status: She is alert.     Coordination: Coordination normal.  Psychiatric:        Mood and Affect: Mood normal.        Behavior: Behavior normal.     ED Course/Procedures   Labs Reviewed  CBC WITH DIFFERENTIAL/PLATELET - Abnormal; Notable for the following components:      Result Value   WBC 17.1 (*)    Hemoglobin 11.0 (*)    HCT 35.5 (*)    MCH 25.5 (*)    Neutro Abs 10.9 (*)    Monocytes Absolute 1.1 (*)    Eosinophils Absolute 3.4 (*)    Abs Immature Granulocytes 0.11 (*)    All other components within normal limits  COMPREHENSIVE METABOLIC PANEL - Abnormal; Notable for the following components:   Glucose, Bld 135 (*)    Albumin 2.9 (*)    Alkaline  Phosphatase 183 (*)    All other components within normal limits  URINALYSIS, ROUTINE W REFLEX MICROSCOPIC - Abnormal; Notable for the following components:   Specific Gravity, Urine >1.046 (*)    Hgb urine dipstick SMALL (*)    Protein, ur 100 (*)    Bacteria, UA RARE (*)    All other components within normal limits  URINE CULTURE  LIPASE, BLOOD  PATHOLOGIST SMEAR REVIEW  CBC  BASIC METABOLIC PANEL   Ct Abdomen Pelvis W Contrast  Result Date: 06/12/2019 CLINICAL DATA:  Right flank pain over the past 2 weeks but worsening today EXAM: CT ABDOMEN AND PELVIS WITH CONTRAST TECHNIQUE: Multidetector CT imaging of the abdomen and pelvis was performed using the standard protocol following bolus administration of intravenous contrast. CONTRAST:  159m OMNIPAQUE IOHEXOL 300 MG/ML  SOLN COMPARISON:  None. FINDINGS: Lower chest: On the scout image, there is fullness in the left hilum. The possibility of a hilar mass or hilar adenopathy is raised. 3 mm right lower lobe nodule on image 1/4, partially imaged. 1.0 by 0.6 by 0.8 cm (average diameter 0.8 cm, volume = 0.3 cm^3) lobe pulmonary nodule  along the inferior pulmonary ligament, image 12/4. There are 2 small right middle lobe nodules in the 3-4 mm range the small subpleural nodule or lymph node along the major fissure. Hepatobiliary: 11.8 by 10.0 by 9.3 cm rim enhancing mass in the left hepatic lobe, image 23/3. Dilated left hepatic ducts versus less likely thrombosed vessels distal to this mass. Hypoenhancement in the left hepatic lobe possibly from tumor vascularity. Gallbladder unremarkable. Pancreas: Unremarkable Spleen: Unremarkable Adrenals/Urinary Tract: 5.0 by 3.0 by 4.7 cm nonspecific mass of the right adrenal gland somewhat flattens the IVC, and is not easily separable from the adjacent 3.7 by 3.3 by 4.3 cm region of adenopathy which surrounds the right renal artery and part of the IVC. The left adrenal gland appears normal. Hypoenhancing 5.9 by 5.6  by 5.4 cm right renal mass on image 38/3. There is hypoenhancement of the parenchyma of the right kidney upper pole. 0.6 cm focal hypodensity within this hypoenhancing parenchyma on image 33/3. The right renal vein is narrowed by the adenopathy and adrenal mass. Posterior to the right kidney lower pole, a soft tissue density nodule in the perirenal space measures 1.3 cm in diameter on image 49/3. Posterior to the left kidney lower pole, a 0.8 cm soft tissue density nodule is shown on image 34/3. A nonspecific 1.0 cm hypodense lesion in the left kidney upper pole a separate 0.9 by 0.7 cm left mid kidney lesion is not entirely specific but more likely to be a cyst. Stomach/Bowel: Unremarkable Vascular/Lymphatic: A lymph node or tumor deposit adjacent to the caudate lobe the upper portion of the porta hepatis measures 2.7 by 2.1 cm on image 22/3. Other abnormal porta hepatis lymph nodes are present. As noted previously there is a conglomerate lymph node surrounding the right renal artery, right renal vein, and part of the IVC. An aortocaval node measures 1.5 cm in short axis on image 39/3. Reproductive: Unremarkable Other: A soft tissue density right omental nodule measures 1.1 by 0.9 cm on image 53/3. Musculoskeletal: Lumbar spondylosis and degenerative disc disease at L4-5 and L5-S1. IMPRESSION: 1. Scattered soft tissue masses compatible with metastatic malignancy. A dominant tumor mass is in the left hepatic lobe measuring 11.8 cm. There is 5.9 cm mass in the right kidney, a right adrenal mass, prominent retroperitoneal and porta hepatis adenopathy, and tumor nodules in the perirenal spaces, right omentum, and probably in the left kidney upper pole. In addition on the scout image there seems to be fullness of the left hilum suspicious for left hilar mass, and small nodules at the right lung base. Given the constellation of findings I would tend to favor lung cancer as the primary, although other entities such as  hepatocellular carcinoma, metastatic renal cell carcinoma, cholangiocarcinoma, or melanoma could have a similar distribution and appearance. CT of the chest to further characterize is recommended. Tissue diagnosis recommended. 2. Adjacent to the right renal mass in the upper pole there is abnormal parenchymal hypodensity. Given the right flank pain, this is probably from pyelonephritis. Also the right renal vein is considerably narrowed and its possible that this low density is related to a venous drainage issue, although pyelonephritis is favored and might be predisposed by the tumor causing some narrowing of the subtending infundibula. There is a 6 mm hypodense focus in the right kidney upper pole, and renal abscess is not readily excluded. 3. Mildly dilated lateral segment left hepatic lobe ducts distal to the tumor, probably from obstruction due to the dominant left hepatic lobe  mass. These results were called by telephone at the time of interpretation on 06/12/2019 at 4:34 pm to Benedetto Goad, [place], who verbally acknowledged these results. Electronically Signed   By: Van Clines M.D.   On: 06/12/2019 16:36     Procedures  MDM   At signout CT of the abdomen and pelvis pending as well as urinalysis.  At 65 called by radiologist regarding CT findings very concerning for metastatic cancer with lesions to the liver, bilateral kidneys, multiple enlarged lymph nodes and lesions to the omentum, CT scout film suggested a left hilar mass which raises concern that lung cancer may be primary.  Radiologist reports that 1 of the masses appears to be causing some compression on the right renal pelvis and there are signs of pyelonephritis which may be causing patient's acute pain.  Given patient's leukocytosis this does seem reasonable.  Will check urinalysis and urine culture but go ahead and start patient on Rocephin.  I discussed CT results with patient provided time to answer questions, discussed that she  will be admitted for treatment of her kidney infection and so they can work on initial cancer treatment and diagnosis as well she expresses understanding and agreement.  Case was discussed with internal medicine teaching service who will see and admit the patient.  Final diagnoses:  Metastatic cancer ([place])  Pyelonephritis  Right flank pain          Janet Berlin 06/12/19 1902    Drenda Freeze, MD 06/15/19 [phone]

## 2019-06-12 NOTE — ED Triage Notes (Signed)
Pt here from home with c/o right side flank pain times 2 weeks worse today , no history of kidney stones

## 2019-06-12 NOTE — ED Provider Notes (Signed)
[facility] EMERGENCY DEPARTMENT Provider Note   CSN: [id number] Arrival date & time: 06/12/19  1333     History   Chief Complaint Chief Complaint  Patient presents with  . Flank Pain    HPI Barbara Watkins is a 57 y.o. female w/ a hx of peripheral vascular disease s/p LLE transmetatarsal amputation, anxiety, former tobacco use & GERD who presents to the ED with complaints of intermittent right flank pain for the past 2 weeks.  Patient describes the pain as being to the right flank radiating around to the right upper and lower quadrant of the abdomen, occurs intermittently lasting for hours at a time, no specific alleviating or aggravating factors.  She notes associated nausea as well as urinary symptoms which include dysuria frequency and urgency.  Denies fever, chills, hematuria, emesis, diarrhea, melena, hematochezia, chest pain, or shortness of breath. Denies numbness, weakness, or incontinence.     HPI  Past Medical History:  Diagnosis Date  . 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 GERD (gastroesophageal reflux disease)   . Peripheral vascular disease [facility])     Patient Active Problem List   Diagnosis Date Noted  . PAD (peripheral artery disease) ([place]) 05/06/2019  . 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 Blue toe syndrome ([place]) 04/19/2019  . ANXIETY 02/25/2007  . DEPRESSIVE DISORDER, NOS 02/25/2007  . GASTROESOPHAGEAL REFLUX, NO ESOPHAGITIS 02/25/2007    Past Surgical History:  Procedure Laterality Date  . ABDOMINAL AORTOGRAM W/LOWER EXTREMITY N/A 04/11/2019   Procedure: ABDOMINAL AORTOGRAM W/LOWER EXTREMITY;  Surgeon: Marty Heck, MD;  Location: [facility] CV LAB;  Service: Cardiovascular;  Laterality: N/A;  . CESAREAN SECTION    . PERIPHERAL VASCULAR INTERVENTION Left 04/11/2019   Procedure: PERIPHERAL VASCULAR INTERVENTION;  Surgeon: Marty Heck, MD;  Location: [facility] CV LAB;  Service:  Cardiovascular;  Laterality: Left;  common iliac  . TRANSMETATARSAL AMPUTATION Left 05/06/2019   Procedure: TRANSMETATARSAL AMPUTATION LEFT FOOT;  Surgeon: Elam Dutch, MD;  Location[facility] OR;  Service: Vascular;  Laterality: Left;     OB History   No obstetric history on file.      Home Medications    Prior to Admission medications   Medication Sig Start Date End Date Taking? Authorizing Provider  aspirin EC 81 MG tablet Take 81 mg by mouth daily.    [provider]  atorvastatin (LIPITOR) 20 MG tablet Take 1 tablet (20 mg total) by mouth daily. 04/11/19 04/10/20  Marty Heck, MD  bisacodyl (DULCOLAX) 5 MG EC tablet Take 10 mg by mouth daily as needed for moderate constipation.    [provider]  clopidogrel (PLAVIX) 75 MG tablet Take 1 tablet (75 mg total) by mouth daily. 04/11/19 04/10/20  Marty Heck, MD  famotidine (PEPCID) 10 MG tablet Take 10 mg by mouth as needed for heartburn or indigestion.     [provider]  naproxen sodium (ALEVE) 220 MG tablet Take 220 mg by mouth daily as needed (pain).     [provider]  traMADol (ULTRAM) 50 MG tablet Take 1 tablet (50 mg total) by mouth every 6 (six) hours as needed for moderate pain. 05/09/19   Ulyses Amor, PA-C    Family History Family History  Family history unknown: Yes    Social History Social History   Tobacco Use  . Smoking status: Former Smoker    Packs/day: 0.25  Types: Cigarettes    Quit date: 03/21/2019    Years since quitting: 0.2  . Smokeless tobacco: Never Used  Substance Use Topics  . Alcohol use: Not Currently  . Drug use: Not Currently     Allergies   Percocet [oxycodone-acetaminophen]   Review of Systems Review of Systems  Constitutional: Negative for chills and fever.  Respiratory: Negative for shortness of breath.   Cardiovascular: Negative for chest pain.  Gastrointestinal: Positive for abdominal pain and nausea. Negative for blood  in stool, constipation, diarrhea and vomiting.  Genitourinary: Positive for dysuria, flank pain and urgency.  Neurological: Negative for weakness and numbness.       Negative for incontinence .  All other systems reviewed and are negative.   Physical Exam Updated Vital Signs BP 122/86   Pulse (!) 115   Temp 98.4 F (36.9 C) (Oral)   Resp 17   SpO2 98%   Physical Exam Vitals signs and nursing note reviewed.  Constitutional:      General: She is not in acute distress.    Appearance: She is well-developed. She is not toxic-appearing.  HENT:     Head: Normocephalic and atraumatic.  Eyes:     General:        Right eye: No discharge.        Left eye: No discharge.     Conjunctiva/sclera: Conjunctivae normal.  Neck:     Musculoskeletal: Neck supple.  Cardiovascular:     Rate and Rhythm: Regular rhythm. Tachycardia present.     Pulses:          Radial pulses are 2+ on the right side and 2+ on the left side.  Pulmonary:     Effort: Pulmonary effort is normal. No respiratory distress.     Breath sounds: Normal breath sounds. No wheezing, rhonchi or rales.  Abdominal:     General: There is no distension.     Tenderness: There is abdominal tenderness in the right upper quadrant. There is right CVA tenderness. There is no left CVA tenderness, guarding or rebound.  Musculoskeletal:     Comments: S/p LLE transmetatarsal amputation.   Skin:    General: Skin is warm and dry.     Findings: No rash.  Neurological:     Mental Status: She is alert.     Comments: Clear speech.   Psychiatric:        Behavior: Behavior normal.    ED Treatments / Results  Labs (all labs ordered are listed, but only abnormal results are displayed) Labs Reviewed  CBC WITH DIFFERENTIAL/PLATELET - Abnormal; Notable for the following components:      Result Value   WBC 17.1 (*)    Hemoglobin 11.0 (*)    HCT 35.5 (*)    MCH 25.5 (*)    Neutro Abs 10.9 (*)    Monocytes Absolute 1.1 (*)    Eosinophils  Absolute 3.4 (*)    Abs Immature Granulocytes 0.11 (*)    All other components within normal limits  COMPREHENSIVE METABOLIC PANEL - Abnormal; Notable for the following components:   Glucose, Bld 135 (*)    Albumin 2.9 (*)    Alkaline Phosphatase 183 (*)    All other components within normal limits  URINE CULTURE  LIPASE, BLOOD  URINALYSIS, ROUTINE W REFLEX MICROSCOPIC  PATHOLOGIST SMEAR REVIEW    EKG   Radiology No results found.  Procedures Procedures (including critical care time)  Medications Ordered in ED Medications - No data to  display   Initial Impression / Assessment and Plan / ED Course  I have reviewed the triage vital signs and the nursing notes.  Pertinent labs & imaging results that were available during my care of the patient were reviewed by me and considered in my medical decision making (see chart for details).   Patient presents to the emergency department with complaints of right flank pain radiating to the right side of the abdomen intermittently for the past 2 weeks associated with nausea and urinary symptoms.  Patient is nontoxic-appearing, mildly tachycardic, vitals otherwise within normal limits upon arrival.  On exam she has right-sided CVA tenderness as well as right upper quadrant abdominal tenderness without peritoneal signs.  Plan for analgesics, antiemetics, fluids & assessment with lab/CT abdomen/pelvis. DDX: pyelonephritis, nephrolithiasis, cholecystitis, pancreatitis, appendicitis, perf, obstruction, intra-abdominal mass or abscess.   CBC: Leukocytosis at 17.1 with left shift.  Anemia is similar to prior. CMP: Mild hyperglycemia.  Mildly elevated alk phos.  Albumin slightly low at 2.9.  LFTs within normal limits.  Total bili within normal limits.  Renal function preserved. Lipase: Within normal limits Urinalysis: Pending CT abdomen/pelvis w contrast: Pending.   16:00: Care transition to Benedetto Goad, PA-C at change of shift pending urinalysis ,  CT abdomen/pelvis as well as re-evaluation & appropriate disposition.   Final Clinical Impressions(s) / ED Diagnoses   Final diagnoses:  None    ED Discharge Orders    None       Amaryllis Dyke, PA-C 06/12/19 1637    Pattricia Boss, MD 06/12/19 1642

## 2019-06-12 NOTE — Progress Notes (Signed)
Patient's daughter, Larene Beach, called requesting to speak with the doctor regarding the patient's plan of care.   Daughter states that she will be working tomorrow but that she is allowed to accept calls at her job [phone]. States that she will be at work from 0930-1700.  After 1700 reports that the best phone number to call is [phone].

## 2019-06-12 NOTE — Progress Notes (Signed)
RN notified Internal Medicine Teaching Service of patient's request for pain medication. Nursing will continue to monitor.

## 2019-06-12 NOTE — ED Notes (Signed)
Tray ordered @ 1818; Santiana Glidden V.

## 2019-06-13 ENCOUNTER — Observation Stay (HOSPITAL_COMMUNITY): Payer: Self-pay

## 2019-06-13 ENCOUNTER — Encounter (HOSPITAL_COMMUNITY): Payer: Self-pay | Admitting: Oncology

## 2019-06-13 DIAGNOSIS — D649 Anemia, unspecified: Secondary | ICD-10-CM

## 2019-06-13 DIAGNOSIS — N12 Tubulo-interstitial nephritis, not specified as acute or chronic: Secondary | ICD-10-CM

## 2019-06-13 DIAGNOSIS — Z89431 Acquired absence of right foot: Secondary | ICD-10-CM

## 2019-06-13 DIAGNOSIS — Z89429 Acquired absence of other toe(s), unspecified side: Secondary | ICD-10-CM

## 2019-06-13 LAB — URINE CULTURE: Culture: NO GROWTH

## 2019-06-13 LAB — CBC
HCT: 31.2 % — ABNORMAL LOW (ref 36.0–46.0)
Hemoglobin: 9.8 g/dL — ABNORMAL LOW (ref 12.0–15.0)
MCH: 25.4 pg — ABNORMAL LOW (ref 26.0–34.0)
MCHC: 31.4 g/dL (ref 30.0–36.0)
MCV: 80.8 fL (ref 80.0–100.0)
Platelets: 289 10*3/uL (ref 150–400)
RBC: 3.86 MIL/uL — ABNORMAL LOW (ref 3.87–5.11)
RDW: 14.1 % (ref 11.5–15.5)
WBC: 16.3 10*3/uL — ABNORMAL HIGH (ref 4.0–10.5)
nRBC: 0 % (ref 0.0–0.2)

## 2019-06-13 LAB — BASIC METABOLIC PANEL
Anion gap: 10 (ref 5–15)
BUN: 7 mg/dL (ref 6–20)
CO2: 24 mmol/L (ref 22–32)
Calcium: 9.4 mg/dL (ref 8.9–10.3)
Chloride: 103 mmol/L (ref 98–111)
Creatinine, Ser: 0.68 mg/dL (ref 0.44–1.00)
GFR calc Af Amer: >60 mL/min (ref >60)
GFR calc non Af Amer: >60 mL/min (ref >60)
Glucose, Bld: 105 mg/dL — ABNORMAL HIGH (ref 70–99)
Potassium: 3.4 mmol/L — ABNORMAL LOW (ref 3.5–5.1)
Sodium: 137 mmol/L (ref 135–145)

## 2019-06-13 MED ORDER — IOHEXOL 300 MG/ML  SOLN
75.0000 mL | Freq: Once | INTRAMUSCULAR | Status: AC | PRN
  Administered 2019-06-13: 15:00:00 75 mL via INTRAVENOUS

## 2019-06-13 MED ORDER — TRAMADOL HCL 50 MG PO TABS
50.0000 mg | ORAL_TABLET | Freq: Four times a day (QID) | ORAL | Status: DC | PRN
  Administered 2019-06-13 – 2019-06-14 (×2): 50 mg via ORAL
  Filled 2019-06-13 (×2): qty 1

## 2019-06-13 MED ORDER — ATORVASTATIN CALCIUM 10 MG PO TABS
20.0000 mg | ORAL_TABLET | Freq: Every day | ORAL | Status: DC
  Administered 2019-06-13 – 2019-06-15 (×3): 20 mg via ORAL
  Filled 2019-06-13 (×3): qty 2

## 2019-06-13 MED ORDER — HEPARIN SODIUM (PORCINE) 5000 UNIT/ML IJ SOLN
5000.0000 [IU] | Freq: Three times a day (TID) | INTRAMUSCULAR | Status: DC
  Administered 2019-06-15: 5000 [IU] via SUBCUTANEOUS
  Filled 2019-06-13: qty 1

## 2019-06-13 NOTE — Progress Notes (Signed)
   Subjective: Patient reports that her pain is better controlled, she is feeling a lot better today. She denies any new complaints today. She reported that she was ready to go home.   Objective:  Vital signs in last 24 hours: Vitals:   06/12/19 1700 06/12/19 1715 06/12/19 2012 06/13/19 0605  BP: (!) 149/86 (!) 144/81 128/73 103/81  Pulse: 93 99 92 76  Resp:   18   Temp:   97.6 F (36.4 C) 98 F (36.7 C)  TempSrc:   Oral Oral  SpO2: 99% 98% 98% 99%  Weight:   55.7 kg 55.6 kg  Height:   5\' 4"  (1.626 m)     General: Sitting up in bed in no acute distress Cardiac: Normal rate, regular rhythm Pulmonary: Clear to auscultation bilaterally, normal work of breathing Abdomen: No abdominal tenderness to palpation, soft Extremity: No lower extremity edema, erythema, or warmth noted bilaterally Psychiatry: Normal affect, mood, behavior    Assessment/Plan:  Active Problems:   Metastatic cancer ([place])  Ms. Hogrefe is a 58 y.o f with pad s/p amputation who is admitted for treatment of pyelonephritis and further work-up of multiple abdominal and lung masses concerning for metastatic disease.  Pyelonephritis: - Continue Ceftriaxone 1 g every 24 hours - Urine culture pending - Tylenol 650 mg every 6 hours PRN  Metastatic masses in renal, adrenal, pulmonary, and peritoneal regions: - Oncology consulted--will follow up recs - IR to perform biopsy tomorrow morning, will make NPO at midnight   Peripheral Vascular Disease: - Continue Plavix and aspirin daily  FEN: Regular diet, NPO at midnight VTE ppx: Heparin Code Status: FULL  Dispo: Anticipated discharge in 1-2 days.   Carroll Sage, MD 06/13/2019, 9:41 AM Pager: [phone]

## 2019-06-13 NOTE — Plan of Care (Signed)

## 2019-06-13 NOTE — Progress Notes (Cosign Needed)
Patient ID: TAMURA LASKY, female   DOB: 1961/11/23, 58 y.o.   MRN: [id number]   Pt is on Plavix Riskful deep tissue bx on anticoagulation  Discussed with Dr Anselm Pancoast Consider full Oncology work up --- CT Chest for new site and less riskful on Plavix Or HOLD Plavix 5 days  Dr Alfonse Spruce aware  Will watch for new CT  Per MD-- CT Chest ordered Will review in am

## 2019-06-13 NOTE — Consult Note (Addendum)
Chief Complaint: Patient was seen in consultation today for liver lesion biopsy Chief Complaint  Patient presents with   Flank Pain   at the request of Dr Isaac Laud  Supervising Physician: Markus Daft  Patient Status: [facility] - In-pt  History of Present Illness: Barbara Watkins is a 58 y.o. female   Admitted with Rt flank pain from ED Work up: CT yesterday: IMPRESSION: 1. Scattered soft tissue masses compatible with metastatic malignancy. A dominant tumor mass is in the left hepatic lobe measuring 11.8 cm. There is 5.9 cm mass in the right kidney, a right adrenal mass, prominent retroperitoneal and porta hepatis adenopathy, and tumor nodules in the perirenal spaces, right omentum, and probably in the left kidney upper pole. In addition on the scout image there seems to be fullness of the left hilum suspicious for left hilar mass, and small nodules at the right lung base. Given the constellation of findings I would tend to favor lung cancer as the primary, although other entities such as hepatocellular carcinoma, metastatic renal cell carcinoma, cholangiocarcinoma, or melanoma could have a similar distribution and appearance. CT of the chest to further characterize is recommended. Tissue diagnosis recommended. 2. Adjacent to the right renal mass in the upper pole there is abnormal parenchymal hypodensity. Given the right flank pain, this is probably from pyelonephritis. Also the right renal vein is considerably narrowed and its possible that this low density is related to a venous drainage issue, although pyelonephritis is favored and might be predisposed by the tumor causing some narrowing of the subtending infundibula. There is a 6 mm hypodense focus in the right kidney upper pole, and renal abscess is not readily excluded. 3. Mildly dilated lateral segment left hepatic lobe ducts distal to the tumor, probably from obstruction due to the dominant left hepatic lobe  mass.  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[place])    GERD (gastroesophageal reflux disease)    Peripheral vascular disease ([place])     Past Surgical History:  Procedure Laterality Date   ABDOMINAL AORTOGRAM W/LOWER EXTREMITY N/A 04/11/2019   Procedure: ABDOMINAL AORTOGRAM W/LOWER EXTREMITY;  Surgeon: Marty Heck, MD;  Location: [place] [facility];  Service: Cardiovascular;  Laterality: N/A;   CESAREAN SECTION     PERIPHERAL VASCULAR INTERVENTION Left 04/11/2019   Procedure: PERIPHERAL VASCULAR INTERVENTION;  Surgeon: Marty Heck, MD;  Location: [place] [facility];  Service: Cardiovascular;  Laterality: Left;  common iliac   TRANSMETATARSAL AMPUTATION Left 05/06/2019   Procedure: TRANSMETATARSAL AMPUTATION LEFT FOOT;  Surgeon: Elam Dutch, MD;  Location: [facility];  Service: Vascular;  Laterality: Left;    Allergies: Percocet [oxycodone-acetaminophen]  Medications: Prior to Admission medications   Medication Sig Start Date End Date Taking? Authorizing Provider  aspirin EC 81 MG tablet Take 81 mg by mouth daily.   Yes [provider]  atorvastatin (LIPITOR) 20 MG tablet Take 1 tablet (20 mg total) by mouth daily. 04/11/19 04/10/20 Yes Marty Heck, MD  bisacodyl (DULCOLAX) 5 MG EC tablet Take 10 mg by mouth daily as needed (to move the bowels).    Yes [provider]  clopidogrel (PLAVIX) 75 MG tablet Take 1 tablet (75 mg total) by mouth daily. 04/11/19  04/10/20 Yes Marty Heck, MD  famotidine (PEPCID) 10 MG tablet Take 10 mg by mouth 2 (two) times daily as needed for heartburn or indigestion.    Yes [provider]  naproxen sodium (ALEVE) 220 MG tablet Take 220-440 mg by mouth 2  (two) times daily as needed (for pain).    Yes [provider]  traMADol (ULTRAM) 50 MG tablet Take 1 tablet (50 mg total) by mouth every 6 (six) hours as needed for moderate pain. 05/09/19  Yes Ulyses Amor, PA-C     Family History  Family history unknown: Yes    Social History   Socioeconomic History   Marital status: Single    Spouse name: Not on file   Number of children: Not on file   Years of education: Not on file   Highest education level: Not on file  Occupational History   Not on file  Social Needs   Financial resource strain: Not on file   Food insecurity    Worry: Not on file    Inability: Not on file   Transportation needs    Medical: Not on file    Non-medical: Not on file  Tobacco Use   Smoking status: Former Smoker    Packs/day: 0.25    Types: Cigarettes    Quit date: 03/21/2019    Years since quitting: 0.2   Smokeless tobacco: Never Used  Substance and Sexual Activity   Alcohol use: Not Currently   Drug use: Not Currently   Sexual activity: Not on file  Lifestyle   Physical activity    Days per week: Not on file    Minutes per session: Not on file   Stress: Not on file  Relationships   Social connections    Talks on phone: Not on file    Gets together: Not on file    Attends religious service: Not on file    Active member of club or organization: Not on file    Attends meetings of clubs or organizations: Not on file    Relationship status: Not on file  Other Topics Concern   Not on file  Social History Narrative   Not on file    Review of Systems: A 12 point ROS discussed and pertinent positives are indicated in the HPI above.  All other systems are negative.  Review of Systems  Constitutional: Positive for activity change and appetite change. Negative for fatigue and fever.  Respiratory: Negative for cough and shortness of breath.   Cardiovascular: Negative for chest pain.  Gastrointestinal: Positive for  abdominal pain and nausea.  Genitourinary: Positive for flank pain.  Neurological: Negative for weakness.  Psychiatric/Behavioral: Negative for behavioral problems and confusion.    Vital Signs: BP 103/81 (BP Location: Left Arm)    Pulse 76    Temp 98 F (36.7 C) (Oral)    Resp 18    Ht 5\' 4"  (1.626 m)    Wt 122 lb 9.2 oz (55.6 kg)    SpO2 99%    BMI 21.04 kg/m   Physical Exam Cardiovascular:     Rate and Rhythm: Normal rate and regular rhythm.     Heart sounds: Normal heart sounds.  Pulmonary:     Effort: Pulmonary effort is normal.     Breath sounds: Normal breath sounds.  Abdominal:     General: There is no distension.     Tenderness: There is abdominal tenderness.  Musculoskeletal: Normal range of motion.  Skin:  General: Skin is warm and dry.  Neurological:     Mental Status: She is alert and oriented to person, place, and time.  Psychiatric:        Mood and Affect: Mood normal.        Behavior: Behavior normal.        Thought Content: Thought content normal.        Judgment: Judgment normal.     Imaging: Ct Abdomen Pelvis W Contrast  Result Date: 06/12/2019 CLINICAL DATA:  Right flank pain over the past 2 weeks but worsening today EXAM: CT ABDOMEN AND PELVIS WITH CONTRAST TECHNIQUE: Multidetector CT imaging of the abdomen and pelvis was performed using the standard protocol following bolus administration of intravenous contrast. CONTRAST:  113mL OMNIPAQUE IOHEXOL 300 MG/ML  SOLN COMPARISON:  None. FINDINGS: Lower chest: On the scout image, there is fullness in the left hilum. The possibility of a hilar mass or hilar adenopathy is raised. 3 mm right lower lobe nodule on image 1/4, partially imaged. 1.0 by 0.6 by 0.8 cm (average diameter 0.8 cm, volume = 0.3 cm^3) lobe pulmonary nodule along the inferior pulmonary ligament, image 12/4. There are 2 small right middle lobe nodules in the 3-4 mm range the small subpleural nodule or lymph node along the major fissure.  Hepatobiliary: 11.8 by 10.0 by 9.3 cm rim enhancing mass in the left hepatic lobe, image 23/3. Dilated left hepatic ducts versus less likely thrombosed vessels distal to this mass. Hypoenhancement in the left hepatic lobe possibly from tumor vascularity. Gallbladder unremarkable. Pancreas: Unremarkable Spleen: Unremarkable Adrenals/Urinary Tract: 5.0 by 3.0 by 4.7 cm nonspecific mass of the right adrenal gland somewhat flattens the IVC, and is not easily separable from the adjacent 3.7 by 3.3 by 4.3 cm region of adenopathy which surrounds the right renal artery and part of the IVC. The left adrenal gland appears normal. Hypoenhancing 5.9 by 5.6 by 5.4 cm right renal mass on image 38/3. There is hypoenhancement of the parenchyma of the right kidney upper pole. 0.6 cm focal hypodensity within this hypoenhancing parenchyma on image 33/3. The right renal vein is narrowed by the adenopathy and adrenal mass. Posterior to the right kidney lower pole, a soft tissue density nodule in the perirenal space measures 1.3 cm in diameter on image 49/3. Posterior to the left kidney lower pole, a 0.8 cm soft tissue density nodule is shown on image 34/3. A nonspecific 1.0 cm hypodense lesion in the left kidney upper pole a separate 0.9 by 0.7 cm left mid kidney lesion is not entirely specific but more likely to be a cyst. Stomach/Bowel: Unremarkable Vascular/Lymphatic: A lymph node or tumor deposit adjacent to the caudate lobe the upper portion of the porta hepatis measures 2.7 by 2.1 cm on image 22/3. Other abnormal porta hepatis lymph nodes are present. As noted previously there is a conglomerate lymph node surrounding the right renal artery, right renal vein, and part of the IVC. An aortocaval node measures 1.5 cm in short axis on image 39/3. Reproductive: Unremarkable Other: A soft tissue density right omental nodule measures 1.1 by 0.9 cm on image 53/3. Musculoskeletal: Lumbar spondylosis and degenerative disc disease at L4-5 and  L5-S1. IMPRESSION: 1. Scattered soft tissue masses compatible with metastatic malignancy. A dominant tumor mass is in the left hepatic lobe measuring 11.8 cm. There is 5.9 cm mass in the right kidney, a right adrenal mass, prominent retroperitoneal and porta hepatis adenopathy, and tumor nodules in the perirenal spaces, right omentum, and probably  in the left kidney upper pole. In addition on the scout image there seems to be fullness of the left hilum suspicious for left hilar mass, and small nodules at the right lung base. Given the constellation of findings I would tend to favor lung cancer as the primary, although other entities such as hepatocellular carcinoma, metastatic renal cell carcinoma, cholangiocarcinoma, or melanoma could have a similar distribution and appearance. CT of the chest to further characterize is recommended. Tissue diagnosis recommended. 2. Adjacent to the right renal mass in the upper pole there is abnormal parenchymal hypodensity. Given the right flank pain, this is probably from pyelonephritis. Also the right renal vein is considerably narrowed and its possible that this low density is related to a venous drainage issue, although pyelonephritis is favored and might be predisposed by the tumor causing some narrowing of the subtending infundibula. There is a 6 mm hypodense focus in the right kidney upper pole, and renal abscess is not readily excluded. 3. Mildly dilated lateral segment left hepatic lobe ducts distal to the tumor, probably from obstruction due to the dominant left hepatic lobe mass. These results were called by telephone at the time of interpretation on 06/12/2019 at 4:34 pm to Benedetto Goad, [place], who verbally acknowledged these results. Electronically Signed   By: Van Clines M.D.   On: 06/12/2019 16:36   Vas [place] Burnard Bunting With/wo Tbi  Result Date: 05/31/2019 LOWER EXTREMITY DOPPLER STUDY Indications: Peripheral artery disease. High Risk Factors: Past history of smoking.   Vascular Interventions: Left common iliac artery stent and trans metatarsal                         amputation in the left foot on 04/11/2019. Performing Technologist: Delorise Shiner RVT  Examination Guidelines: A complete evaluation includes at minimum, Doppler waveform signals and systolic blood pressure reading at the level of bilateral brachial, anterior tibial, and posterior tibial arteries, when vessel segments are accessible. Bilateral testing is considered an integral part of a complete examination. Photoelectric Plethysmograph (PPG) waveforms and toe systolic pressure readings are included as required and additional duplex testing as needed. Limited examinations for reoccurring indications may be performed as noted.  ABI Findings: +---------+------------------+-----+---------+--------+  Right     Rt Pressure (mmHg) Index Waveform  Comment   +---------+------------------+-----+---------+--------+  Brachial  125                                          +---------+------------------+-----+---------+--------+  ATA       134                0.87  biphasic            +---------+------------------+-----+---------+--------+  PTA       135                0.88  triphasic           +---------+------------------+-----+---------+--------+  Great Toe 75                 0.49                      +---------+------------------+-----+---------+--------+ +---------+------------------+-----+--------+---------------------------+  Left      Lt Pressure (mmHg) Index Waveform Comment                      +---------+------------------+-----+--------+---------------------------+  Brachial  154                                                            +---------+------------------+-----+--------+---------------------------+  ATA       144                0.94                                        +---------+------------------+-----+--------+---------------------------+  PTA       104                0.68                                         +---------+------------------+-----+--------+---------------------------+  Great Toe                                   trans metatarsal amputation  +---------+------------------+-----+--------+---------------------------+ +-------+-----------+-----------+------------+------------+  ABI/TBI Today's ABI Today's TBI Previous ABI Previous TBI  +-------+-----------+-----------+------------+------------+  Right   0.88        0.49        0.99         0.73          +-------+-----------+-----------+------------+------------+  Left    0.94                    0.74         0.0           +-------+-----------+-----------+------------+------------+ Compared to prior study on 04/05/2019. Left ABIs appear increased compared to prior study on 04/05/2019.  Summary: Right: Resting right ankle-brachial index indicates mild right lower extremity arterial disease. The right toe-brachial index is abnormal. RT great toe pressure = 75 mmHg. Left: Resting left ankle-brachial index indicates mild left lower extremity arterial disease.  *See table(s) above for measurements and observations.  Electronically signed by Monica Martinez MD on 05/31/2019 at 11:39:44 AM.    Final    Vas US Aorta/ivc/iliacs  Result Date: 05/31/2019 ABDOMINAL AORTA STUDY Risk Factors: Past history of smoking. Vascular Interventions: Left common iliac artery stent and trans metatarsal                         amputation 04/11/2019. Limitations: Patient discomfort.  Performing Technologist: Delorise Shiner RVT  Examination Guidelines: A complete evaluation includes B-mode imaging, spectral Doppler, color Doppler, and power Doppler as needed of all accessible portions of each vessel. Bilateral testing is considered an integral part of a complete examination. Limited examinations for reoccurring indications may be performed as noted.  Abdominal Aorta Findings: +-----------+-------+----------+----------+--------+--------+--------+  Location    AP (cm) Trans (cm) PSV  (cm/s) Waveform Thrombus Comments  +-----------+-------+----------+----------+--------+--------+--------+  Mid                            67                                     +-----------+-------+----------+----------+--------+--------+--------+  Distal                         72                                     +-----------+-------+----------+----------+--------+--------+--------+  RT CIA Prox                    140                                    +-----------+-------+----------+----------+--------+--------+--------+  RT CIA Mid                     245                                    +-----------+-------+----------+----------+--------+--------+--------+  Left Stent(s): +----------------------------+--------+--------+--------+--------+  Proximal common iliac artery PSV cm/s Stenosis Waveform Comments  +----------------------------+--------+--------+--------+--------+  Prox to Stent                108                                  +----------------------------+--------+--------+--------+--------+  Proximal Stent               135                                  +----------------------------+--------+--------+--------+--------+  Mid Stent                    113                                  +----------------------------+--------+--------+--------+--------+  Distal Stent                 111                                  +----------------------------+--------+--------+--------+--------+  Distal to Stent              204                                  +----------------------------+--------+--------+--------+--------+ No evidence of restenosis.  Summary: Stenosis: +------------------+-------------+-----------+  Location           Stenosis      Stent        +------------------+-------------+-----------+  Right Common Iliac <50% stenosis              +------------------+-------------+-----------+  Left Common Iliac                no stenosis  +------------------+-------------+-----------+  *See  table(s) above for measurements and observations.  Electronically signed by Monica Martinez MD on 05/31/2019 at 11:40:53 AM.    Final     Labs:  CBC: Recent Labs    05/06/19 1244 05/07/19 0237 06/12/19 1422 06/13/19 0233  WBC 18.9* 14.9* 17.1* 16.3*  HGB  10.9* 9.2* 11.0* 9.8*  HCT 36.3 28.6* 35.5* 31.2*  PLT 379 402* 306 289    COAGS: No results for input(s): INR, APTT in the last 8760 hours.  BMP: Recent Labs    05/06/19 1244 05/07/19 0237 06/12/19 1422 06/13/19 0233  NA 138 138 135 137  K 3.8 3.9 3.7 3.4*  CL 99 102 101 103  CO2 25 25 24 24   GLUCOSE 109* 160* 135* 105*  BUN 14 11 11 7   CALCIUM 10.4* 9.6 9.9 9.4  CREATININE 0.79 0.90 0.75 0.68  GFRNONAA >60 >60 >60 >60  GFRAA >60 >60 >60 >60    LIVER FUNCTION TESTS: Recent Labs    06/12/19 1422  BILITOT 0.4  AST 23  ALT 18  ALKPHOS 183*  PROT 6.5  ALBUMIN 2.9*    TUMOR MARKERS: No results for input(s): AFPTM, CEA, CA199, CHROMGRNA in the last 8760 hours.  Assessment and Plan:  Rt flank pain Liver lesion; renal mass; adrenal mass; LAN Biopsy requested for tissue diagnosis Scheduled for liver lesion biopsy 6/16 Risks and benefits of liver lesion bx was discussed with the patient and/or patient's family including, but not limited to bleeding, infection, damage to adjacent structures or low yield requiring additional tests.  All of the questions were answered and there is agreement to proceed. Consent signed and in chart.  Pt is actively on Plavix-- recent left toes amputation 05/06/19- PVD Will discuss with Dr Anselm Pancoast  Thank you for this interesting consult.  I greatly enjoyed meeting Barbara Watkins and look forward to participating in their care.  A copy of this report was sent to the requesting provider on this date.  Electronically Signed: Lavonia Drafts, PA-C 06/13/2019, 11:37 AM   I spent a total of 40 Minutes    in face to face in clinical consultation, greater than 50% of which was  counseling/coordinating care for liver lesion biopsy

## 2019-06-13 NOTE — Progress Notes (Signed)
IR requested by Dr. Dareen Piano for possible image-guided liver versus lung versus adrenal versus kidney biopsy.  Originally, imaging reviewed by Dr. Anselm Pancoast who approved image-guided liver lesions biopsy. However, patient currently on Plavix and cannot hold. CT chest obtained and reviewed by Dr. Anselm Pancoast who approved image-guided left supraclavicular versus left axillary lymph node biopsy- plan for procedure tomorrow 06/14/2019. New consent obtained, signed and in IR.  Please call IR with questions/concerns.   Bea Graff Wilmoth Rasnic, PA-C 06/13/2019, 5:14 PM

## 2019-06-13 NOTE — Progress Notes (Signed)
Patient ID: Barbara Watkins, female   DOB: [birth year], 58 y.o.   MRN: [id number]  Rt flank pain Admission for same  CT yesterday: IMPRESSION: 1. Scattered soft tissue masses compatible with metastatic malignancy. A dominant tumor mass is in the left hepatic lobe measuring 11.8 cm. There is 5.9 cm mass in the right kidney, a right adrenal mass, prominent retroperitoneal and porta hepatis adenopathy, and tumor nodules in the perirenal spaces, right omentum, and probably in the left kidney upper pole. In addition on the scout image there seems to be fullness of the left hilum suspicious for left hilar mass, and small nodules at the right lung base. Given the constellation of findings I would tend to favor lung cancer as the primary, although other entities such as hepatocellular carcinoma, metastatic renal cell carcinoma, cholangiocarcinoma, or melanoma could have a similar distribution and appearance. CT of the chest to further characterize is recommended. Tissue diagnosis recommended. 2. Adjacent to the right renal mass in the upper pole there is abnormal parenchymal hypodensity. Given the right flank pain, this is probably from pyelonephritis. Also the right renal vein is considerably narrowed and its possible that this low density is related to a venous drainage issue, although pyelonephritis is favored and might be predisposed by the tumor causing some narrowing of the subtending infundibula. There is a 6 mm hypodense focus in the right kidney upper pole, and renal abscess is not readily excluded. 3. Mildly dilated lateral segment left hepatic lobe ducts distal to the tumor, probably from obstruction due to the dominant left hepatic lobe mass.  Request for biopsy of best area conducive to biopsy  Will await Oncology consult And will have Dr Anselm Pancoast review imaging  Call into Resident now

## 2019-06-13 NOTE — TOC Initial Note (Signed)
Transition of Care [facility]) - Initial/Assessment Note    Patient Details  Name: Barbara Watkins MRN: [id number] Date of Birth: [birth year]  Transition of [facility] CM/SW Contact:    Marilu Favre, RN Phone Number: 06/13/2019, 11:20 AM  Clinical Narrative:                  Patient from home with daughter.   Patient applied for Medicaid but unsure if Medicaid is active. Called left message for Development worker, community.   Follow up appointment with [facility] June 23, 2019 at 1:30 pm by telephone .  Will continue to follow for discharge needs.  Expected Discharge Plan: Home/Self Care Barriers to Discharge: Continued Medical Work up   Patient Goals and CMS Choice Patient states their goals for this hospitalization and ongoing recovery are:: to go home CMS Medicare.gov Compare Post Acute Care list provided to:: Patient Choice offered to / list presented to : NA  Expected Discharge Plan and Services Expected Discharge Plan: Home/Self Care In-house Referral: Financial Counselor Discharge Planning Services: CM Consult, [facility]   Living arrangements for the past 2 months: Single Family Home                 DME Arranged: N/A                    Prior Living Arrangements/Services Living arrangements for the past 2 months: Single Family Home Lives with:: Adult Children Patient language and need for interpreter reviewed:: Yes Do you feel safe going back to the place where you live?: Yes      Need for Family Participation in Patient Care: No (Comment) Care giver support system in place?: No (comment)   Criminal Activity/Legal Involvement Pertinent to Current Situation/Hospitalization: No - Comment as needed  Activities of Daily Living      Permission Sought/Granted Permission sought to share information with : Case Manager, PCP Permission granted to share information with : Yes, Verbal Permission Granted     Permission granted to  share info w AGENCY: [facility]        Emotional Assessment Appearance:: Appears stated age Attitude/Demeanor/Rapport: Engaged Affect (typically observed): Accepting Orientation: : Oriented to Self, Oriented to Place, Oriented to  Time, Oriented to Situation Alcohol / Substance Use: Not Applicable Psych Involvement: No (comment)  Admission diagnosis:  Metastatic cancer ([place]) [C79.9] Pyelonephritis [N12] Right flank pain [R10.9] Patient Active Problem List   Diagnosis Date Noted  . Metastatic cancer ([place]) 06/12/2019  . PAD (peripheral artery disease) ([place]) 05/06/2019  . 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 Blue toe syndrome ([place]) 04/19/2019  . ANXIETY 02/25/2007  . DEPRESSIVE DISORDER, NOS 02/25/2007  . GASTROESOPHAGEAL REFLUX, NO ESOPHAGITIS 02/25/2007   PCP:  Patient, No Pcp Per Pharmacy:   [facility] [address], [place] - [place] [address] Phone: [phone] Fax: [phone]     Social Determinants of Health (SDOH) Interventions    Readmission Risk Interventions Readmission Risk Prevention Plan 05/09/2019  Post Dischage Appt Complete  Medication Screening Complete  Transportation Screening Complete  Some recent data might be hidden

## 2019-06-13 NOTE — Consult Note (Addendum)
[place]  [phone] [phone]   MEDICAL ONCOLOGY - INITIAL CONSULTATION  Referral MD: Dr. Aldine Contes  Reason for Referral: liver, adrenal, right kidney, and hilar mass/adenopathy  HPI: Barbara Watkins is a 58 year old female with a past medical history including peripheral artery disease s/p transmetatarsal amputation, blue toe syndrome, major depressive disorder. She presented with a 2-3 weeks history of right flank pain. Pain has been intermittent 8/10 flank pain which last for several hours.  She describes it as an alternating sharp and dull pain which is only alleviated with lying on the left side and is aggravated by lying on her back.  Work up in the ER was significant for leukocytosis, anemia, and a CT of the abdomen and pelvis which showed scattered soft tissue masses in left hepatic lobe, right kidney, and the right adrenal gland.  Fullness of the left hilum suspicious for left hilar mass and small nodules at the right lung base.  Also noted is signs of right pyelonephritis. The patient was admitted for treatment of her pyelonephritis and further work up her masses.   When seen today, the patient reports anorexia and weight loss over the past few weeks. She attributes this to pain. She is unsure how much weight she has lost. States right flank pain is better. Denies fever and chills. Denies headaches and vision changes. Denies chest pain, shortness of breath, nausea, vomiting. She has noticed some constipation that has started recently. Denies diarrhea. She also has dysuria, increased frequency, and urgency. Denies bleeding. Denies alcohol use. Has a history of smoking 1/2 ppd of cigarettes for about 35 years. Quit in March 2020. Medical Oncology was asked to see the patient to make recommendations regarding her liver and other masses noted on the CT scan.     Past Medical History:  Diagnosis Date  . Blue toe syndrome of left lower extremity ([place])    . GERD (gastroesophageal reflux disease)   . Peripheral vascular disease ([place])   :  Past Surgical History:  Procedure Laterality Date  . ABDOMINAL AORTOGRAM W/LOWER EXTREMITY N/A 04/11/2019   Procedure: ABDOMINAL AORTOGRAM W/LOWER EXTREMITY;  Surgeon: Marty Heck, MD;  [facility];  Service: Cardiovascular;  Laterality: N/A;  . CESAREAN SECTION    . PERIPHERAL VASCULAR INTERVENTION Left 04/11/2019   Procedure: PERIPHERAL VASCULAR INTERVENTION;  Surgeon: Marty Heck, MD;  Location: [facility];  Service: Cardiovascular;  Laterality: Left;  common iliac  . TRANSMETATARSAL AMPUTATION Left 05/06/2019   Procedure: TRANSMETATARSAL AMPUTATION LEFT FOOT;  Surgeon: Elam Dutch, MD;  Location: [facility] OR;  Service: Vascular;  Laterality: Left;  :  Current Facility-Administered Medications  Medication Dose Route Frequency Provider Last Rate Last Dose  . acetaminophen (TYLENOL) tablet 650 mg  650 mg Oral Q6H PRN Carroll Sage, MD   650 mg at 06/13/19 1148  . aspirin EC tablet 81 mg  81 mg Oral Daily Chundi, Vahini, MD      . atorvastatin (LIPITOR) tablet 20 mg  20 mg Oral Daily Chundi, Vahini, MD   20 mg at 06/13/19 1004  . cefTRIAXone (ROCEPHIN) 1 g in sodium chloride 0.9 % 100 mL IVPB  1 g Intravenous Q24H Nita Sickle M, MD      . clopidogrel (PLAVIX) tablet 75 mg  75 mg Oral Daily Monia Sabal, PA-C      . [START ON 06/15/2019] heparin injection 5,000 Units  5,000 Units Subcutaneous Q8H Monia Sabal, PA-C      .  promethazine (PHENERGAN) tablet 12.5 mg  12.5 mg Oral Q6H PRN Chundi, Vahini, MD      . senna-docusate (Senokot-S) tablet 1 tablet  1 tablet Oral QHS PRN Lars Mage, MD         Allergies  Allergen Reactions  . Percocet [Oxycodone-Acetaminophen] Other (See Comments)    Chest pressure and back, diaphoretic, lightheaded  :  Family History  Problem Relation Age of Onset  . Cancer Mother   . Cancer Father   :  Social History    Socioeconomic History  . Marital status: Single    Spouse name: Not on file  . Number of children: 2  . Years of education: Not on file  . Highest education level: Not on file  Occupational History  . Not on file  Social Needs  . Financial resource strain: Not on file  . Food insecurity    Worry: Not on file    Inability: Not on file  . Transportation needs    Medical: Not on file    Non-medical: Not on file  Tobacco Use  . Smoking status: Former Smoker    Packs/day: 0.25    Years: 35.00    Pack years: 8.75    Types: Cigarettes    Quit date: 03/21/2019    Years since quitting: 0.2  . Smokeless tobacco: Never Used  Substance and Sexual Activity  . Alcohol use: Not Currently  . Drug use: Not Currently  . Sexual activity: Not on file  Lifestyle  . Physical activity    Days per week: Not on file    Minutes per session: Not on file  . Stress: Not on file  Relationships  . Social Herbalist on phone: Not on file    Gets together: Not on file    Attends religious service: Not on file    Active member of club or organization: Not on file    Attends meetings of clubs or organizations: Not on file    Relationship status: Not on file  . Intimate partner violence    Fear of current or ex partner: Not on file    Emotionally abused: Not on file    Physically abused: Not on file    Forced sexual activity: Not on file  Other Topics Concern  . Not on file  Social History Narrative   Patient lives with daughter.   :  Review of Systems: A comprehensive 14 point ROS was negative except as noted in the HPI.   Exam: Patient Vitals for the past 24 hrs:  BP Temp Temp src Pulse Resp SpO2 Height Weight  06/13/19 0605 103/81 98 F (36.7 C) Oral 76 - 99 % - 122 lb 9.2 oz (55.6 kg)  06/12/19 2012 128/73 97.6 F (36.4 C) Oral 92 18 98 % 5\' 4"  (1.626 m) 122 lb 12.7 oz (55.7 kg)  06/12/19 1715 (!) 144/81 - - 99 - 98 % - -  06/12/19 1700 (!) 149/86 - - 93 - 99 % - -   06/12/19 1645 136/77 - - 83 - 99 % - -  06/12/19 1515 138/84 - - 98 - 96 % - -  06/12/19 1500 (!) 157/76 - - (!) 101 - 97 % - -  06/12/19 1445 131/80 - - (!) 101 - 99 % - -  06/12/19 1430 130/86 - - (!) 103 - 99 % - -    General:  well-nourished in no acute distress.   Eyes:  no  scleral icterus.   ENT:  There were no oropharyngeal lesions.   Neck was without thyromegaly.   Lymphatics:  Negative cervical, supraclavicular or axillary adenopathy.  Pea-sized left scalene node.  Respiratory: lungs were clear bilaterally without wheezing or crackles.  Cardiovascular:  Regular rate and rhythm, S1/S2, without murmur, rub or gallop.  There was no pedal edema.   GI:  abdomen was soft, flat, nontender, nondistended, without organomegaly.   Muscoloskeletal:  no spinal tenderness of palpation of vertebral spine. R transmetatarsal amputation.   Skin exam was without echymosis, petichae.   Neuro exam was nonfocal. Patient was alert and oriented.  Attention was good.   Language was appropriate.  Mood was normal without depression.  Speech was not pressured.  Thought content was not tangential.   Breast: Bilateral breast without mass Vascular: Status post  right forefoot amputation   Lab Results  Component Value Date   WBC 16.3 (H) 06/13/2019   HGB 9.8 (L) 06/13/2019   HCT 31.2 (L) 06/13/2019   PLT 289 06/13/2019   GLUCOSE 105 (H) 06/13/2019   ALT 18 06/12/2019   AST 23 06/12/2019   NA 137 06/13/2019   K 3.4 (L) 06/13/2019   CL 103 06/13/2019   CREATININE 0.68 06/13/2019   BUN 7 06/13/2019   CO2 24 06/13/2019    Ct Abdomen Pelvis W Contrast  Result Date: 06/12/2019 CLINICAL DATA:  Right flank pain over the past 2 weeks but worsening today EXAM: CT ABDOMEN AND PELVIS WITH CONTRAST TECHNIQUE: Multidetector CT imaging of the abdomen and pelvis was performed using the standard protocol following bolus administration of intravenous contrast. CONTRAST:  155mL OMNIPAQUE IOHEXOL 300 MG/ML  SOLN  COMPARISON:  None. FINDINGS: Lower chest: On the scout image, there is fullness in the left hilum. The possibility of a hilar mass or hilar adenopathy is raised. 3 mm right lower lobe nodule on image 1/4, partially imaged. 1.0 by 0.6 by 0.8 cm (average diameter 0.8 cm, volume = 0.3 cm^3) lobe pulmonary nodule along the inferior pulmonary ligament, image 12/4. There are 2 small right middle lobe nodules in the 3-4 mm range the small subpleural nodule or lymph node along the major fissure. Hepatobiliary: 11.8 by 10.0 by 9.3 cm rim enhancing mass in the left hepatic lobe, image 23/3. Dilated left hepatic ducts versus less likely thrombosed vessels distal to this mass. Hypoenhancement in the left hepatic lobe possibly from tumor vascularity. Gallbladder unremarkable. Pancreas: Unremarkable Spleen: Unremarkable Adrenals/Urinary Tract: 5.0 by 3.0 by 4.7 cm nonspecific mass of the right adrenal gland somewhat flattens the IVC, and is not easily separable from the adjacent 3.7 by 3.3 by 4.3 cm region of adenopathy which surrounds the right renal artery and part of the IVC. The left adrenal gland appears normal. Hypoenhancing 5.9 by 5.6 by 5.4 cm right renal mass on image 38/3. There is hypoenhancement of the parenchyma of the right kidney upper pole. 0.6 cm focal hypodensity within this hypoenhancing parenchyma on image 33/3. The right renal vein is narrowed by the adenopathy and adrenal mass. Posterior to the right kidney lower pole, a soft tissue density nodule in the perirenal space measures 1.3 cm in diameter on image 49/3. Posterior to the left kidney lower pole, a 0.8 cm soft tissue density nodule is shown on image 34/3. A nonspecific 1.0 cm hypodense lesion in the left kidney upper pole a separate 0.9 by 0.7 cm left mid kidney lesion is not entirely specific but more likely to be a cyst. Stomach/Bowel:  Unremarkable Vascular/Lymphatic: A lymph node or tumor deposit adjacent to the caudate lobe the upper portion of the  porta hepatis measures 2.7 by 2.1 cm on image 22/3. Other abnormal porta hepatis lymph nodes are present. As noted previously there is a conglomerate lymph node surrounding the right renal artery, right renal vein, and part of the IVC. An aortocaval node measures 1.5 cm in short axis on image 39/3. Reproductive: Unremarkable Other: A soft tissue density right omental nodule measures 1.1 by 0.9 cm on image 53/3. Musculoskeletal: Lumbar spondylosis and degenerative disc disease at L4-5 and L5-S1. IMPRESSION: 1. Scattered soft tissue masses compatible with metastatic malignancy. A dominant tumor mass is in the left hepatic lobe measuring 11.8 cm. There is 5.9 cm mass in the right kidney, a right adrenal mass, prominent retroperitoneal and porta hepatis adenopathy, and tumor nodules in the perirenal spaces, right omentum, and probably in the left kidney upper pole. In addition on the scout image there seems to be fullness of the left hilum suspicious for left hilar mass, and small nodules at the right lung base. Given the constellation of findings I would tend to favor lung cancer as the primary, although other entities such as hepatocellular carcinoma, metastatic renal cell carcinoma, cholangiocarcinoma, or melanoma could have a similar distribution and appearance. CT of the chest to further characterize is recommended. Tissue diagnosis recommended. 2. Adjacent to the right renal mass in the upper pole there is abnormal parenchymal hypodensity. Given the right flank pain, this is probably from pyelonephritis. Also the right renal vein is considerably narrowed and its possible that this low density is related to a venous drainage issue, although pyelonephritis is favored and might be predisposed by the tumor causing some narrowing of the subtending infundibula. There is a 6 mm hypodense focus in the right kidney upper pole, and renal abscess is not readily excluded. 3. Mildly dilated lateral segment left hepatic lobe  ducts distal to the tumor, probably from obstruction due to the dominant left hepatic lobe mass. These results were called by telephone at the time of interpretation on 06/12/2019 at 4:34 pm to Benedetto Goad, [place], who verbally acknowledged these results. Electronically Signed   By: Van Clines M.D.   On: 06/12/2019 16:36   Vas [place] Burnard Bunting With/wo Tbi  Result Date: 05/31/2019 LOWER EXTREMITY DOPPLER STUDY Indications: Peripheral artery disease. High Risk Factors: Past history of smoking.  Vascular Interventions: Left common iliac artery stent and trans metatarsal                         amputation in the left foot on 04/11/2019. Performing Technologist: Delorise Shiner RVT  Examination Guidelines: A complete evaluation includes at minimum, Doppler waveform signals and systolic blood pressure reading at the level of bilateral brachial, anterior tibial, and posterior tibial arteries, when vessel segments are accessible. Bilateral testing is considered an integral part of a complete examination. Photoelectric Plethysmograph (PPG) waveforms and toe systolic pressure readings are included as required and additional duplex testing as needed. Limited examinations for reoccurring indications may be performed as noted.  ABI Findings: +---------+------------------+-----+---------+--------+ Right    Rt Pressure (mmHg)IndexWaveform Comment  +---------+------------------+-----+---------+--------+ Brachial 125                                      +---------+------------------+-----+---------+--------+ ATA      134  0.87 biphasic          +---------+------------------+-----+---------+--------+ PTA      135               0.88 triphasic         +---------+------------------+-----+---------+--------+ Great Toe75                0.49                   +---------+------------------+-----+---------+--------+ +---------+------------------+-----+--------+---------------------------+ Left     Lt  Pressure (mmHg)IndexWaveformComment                     +---------+------------------+-----+--------+---------------------------+ Brachial 154                                                        +---------+------------------+-----+--------+---------------------------+ ATA      144               0.94                                     +---------+------------------+-----+--------+---------------------------+ PTA      104               0.68                                     +---------+------------------+-----+--------+---------------------------+ Great Toe                               trans metatarsal amputation +---------+------------------+-----+--------+---------------------------+ +-------+-----------+-----------+------------+------------+ ABI/TBIToday's ABIToday's TBIPrevious ABIPrevious TBI +-------+-----------+-----------+------------+------------+ Right  0.88       0.49       0.99        0.73         +-------+-----------+-----------+------------+------------+ Left   0.94                  0.74        0.0          +-------+-----------+-----------+------------+------------+ Compared to prior study on 04/05/2019. Left ABIs appear increased compared to prior study on 04/05/2019.  Summary: Right: Resting right ankle-brachial index indicates mild right lower extremity arterial disease. The right toe-brachial index is abnormal. RT great toe pressure = 75 mmHg. Left: Resting left ankle-brachial index indicates mild left lower extremity arterial disease.  *See table(s) above for measurements and observations.  Electronically signed by Monica Martinez MD on 05/31/2019 at 11:39:44 AM.    Final    Vas US Aorta/ivc/iliacs  Result Date: 05/31/2019 ABDOMINAL AORTA STUDY Risk Factors: Past history of smoking. Vascular Interventions: Left common iliac artery stent and trans metatarsal                         amputation 04/11/2019. Limitations: Patient discomfort.   Performing Technologist: Delorise Shiner RVT  Examination Guidelines: A complete evaluation includes B-mode imaging, spectral Doppler, color Doppler, and power Doppler as needed of all accessible portions of each vessel. Bilateral testing is considered an integral part of a complete examination. Limited examinations for reoccurring indications may be performed as noted.  Abdominal Aorta Findings: +-----------+-------+----------+----------+--------+--------+--------+  Location   AP (cm)Trans (cm)PSV (cm/s)WaveformThrombusComments +-----------+-------+----------+----------+--------+--------+--------+ Mid                         67                                 +-----------+-------+----------+----------+--------+--------+--------+ Distal                      72                                 +-----------+-------+----------+----------+--------+--------+--------+ RT CIA Prox                 140                                +-----------+-------+----------+----------+--------+--------+--------+ RT CIA Mid                  245                                +-----------+-------+----------+----------+--------+--------+--------+  Left Stent(s): +----------------------------+--------+--------+--------+--------+ Proximal common iliac arteryPSV cm/sStenosisWaveformComments +----------------------------+--------+--------+--------+--------+ Prox to Stent               108                              +----------------------------+--------+--------+--------+--------+ Proximal Stent              135                              +----------------------------+--------+--------+--------+--------+ Mid Stent                   113                              +----------------------------+--------+--------+--------+--------+ Distal Stent                111                              +----------------------------+--------+--------+--------+--------+ Distal to Stent              204                              +----------------------------+--------+--------+--------+--------+ No evidence of restenosis.  Summary: Stenosis: +------------------+-------------+-----------+ Location          Stenosis     Stent       +------------------+-------------+-----------+ Right Common Iliac<50% stenosis            +------------------+-------------+-----------+ Left Common Iliac              no stenosis +------------------+-------------+-----------+  *See table(s) above for measurements and observations.  Electronically signed by Monica Martinez MD on 05/31/2019 at 11:40:53 AM.    Final     Ct Abdomen Pelvis W Contrast  Result Date: 06/12/2019 CLINICAL DATA:  Right flank pain over the past 2 weeks but worsening today EXAM: CT ABDOMEN AND PELVIS  WITH CONTRAST TECHNIQUE: Multidetector CT imaging of the abdomen and pelvis was performed using the standard protocol following bolus administration of intravenous contrast. CONTRAST:  181mL OMNIPAQUE IOHEXOL 300 MG/ML  SOLN COMPARISON:  None. FINDINGS: Lower chest: On the scout image, there is fullness in the left hilum. The possibility of a hilar mass or hilar adenopathy is raised. 3 mm right lower lobe nodule on image 1/4, partially imaged. 1.0 by 0.6 by 0.8 cm (average diameter 0.8 cm, volume = 0.3 cm^3) lobe pulmonary nodule along the inferior pulmonary ligament, image 12/4. There are 2 small right middle lobe nodules in the 3-4 mm range the small subpleural nodule or lymph node along the major fissure. Hepatobiliary: 11.8 by 10.0 by 9.3 cm rim enhancing mass in the left hepatic lobe, image 23/3. Dilated left hepatic ducts versus less likely thrombosed vessels distal to this mass. Hypoenhancement in the left hepatic lobe possibly from tumor vascularity. Gallbladder unremarkable. Pancreas: Unremarkable Spleen: Unremarkable Adrenals/Urinary Tract: 5.0 by 3.0 by 4.7 cm nonspecific mass of the right adrenal gland somewhat flattens  the IVC, and is not easily separable from the adjacent 3.7 by 3.3 by 4.3 cm region of adenopathy which surrounds the right renal artery and part of the IVC. The left adrenal gland appears normal. Hypoenhancing 5.9 by 5.6 by 5.4 cm right renal mass on image 38/3. There is hypoenhancement of the parenchyma of the right kidney upper pole. 0.6 cm focal hypodensity within this hypoenhancing parenchyma on image 33/3. The right renal vein is narrowed by the adenopathy and adrenal mass. Posterior to the right kidney lower pole, a soft tissue density nodule in the perirenal space measures 1.3 cm in diameter on image 49/3. Posterior to the left kidney lower pole, a 0.8 cm soft tissue density nodule is shown on image 34/3. A nonspecific 1.0 cm hypodense lesion in the left kidney upper pole a separate 0.9 by 0.7 cm left mid kidney lesion is not entirely specific but more likely to be a cyst. Stomach/Bowel: Unremarkable Vascular/Lymphatic: A lymph node or tumor deposit adjacent to the caudate lobe the upper portion of the porta hepatis measures 2.7 by 2.1 cm on image 22/3. Other abnormal porta hepatis lymph nodes are present. As noted previously there is a conglomerate lymph node surrounding the right renal artery, right renal vein, and part of the IVC. An aortocaval node measures 1.5 cm in short axis on image 39/3. Reproductive: Unremarkable Other: A soft tissue density right omental nodule measures 1.1 by 0.9 cm on image 53/3. Musculoskeletal: Lumbar spondylosis and degenerative disc disease at L4-5 and L5-S1. IMPRESSION: 1. Scattered soft tissue masses compatible with metastatic malignancy. A dominant tumor mass is in the left hepatic lobe measuring 11.8 cm. There is 5.9 cm mass in the right kidney, a right adrenal mass, prominent retroperitoneal and porta hepatis adenopathy, and tumor nodules in the perirenal spaces, right omentum, and probably in the left kidney upper pole. In addition on the scout image there seems to be  fullness of the left hilum suspicious for left hilar mass, and small nodules at the right lung base. Given the constellation of findings I would tend to favor lung cancer as the primary, although other entities such as hepatocellular carcinoma, metastatic renal cell carcinoma, cholangiocarcinoma, or melanoma could have a similar distribution and appearance. CT of the chest to further characterize is recommended. Tissue diagnosis recommended. 2. Adjacent to the right renal mass in the upper pole there is abnormal parenchymal hypodensity. Given the right flank pain,  this is probably from pyelonephritis. Also the right renal vein is considerably narrowed and its possible that this low density is related to a venous drainage issue, although pyelonephritis is favored and might be predisposed by the tumor causing some narrowing of the subtending infundibula. There is a 6 mm hypodense focus in the right kidney upper pole, and renal abscess is not readily excluded. 3. Mildly dilated lateral segment left hepatic lobe ducts distal to the tumor, probably from obstruction due to the dominant left hepatic lobe mass. These results were called by telephone at the time of interpretation on 06/12/2019 at 4:34 pm to Benedetto Goad, [place], who verbally acknowledged these results. Electronically Signed   By: Van Clines M.D.   On: 06/12/2019 16:36   Vas [place] Burnard Bunting With/wo Tbi  Result Date: 05/31/2019 LOWER EXTREMITY DOPPLER STUDY Indications: Peripheral artery disease. High Risk Factors: Past history of smoking.  Vascular Interventions: Left common iliac artery stent and trans metatarsal                         amputation in the left foot on 04/11/2019. Performing Technologist: Delorise Shiner RVT  Examination Guidelines: A complete evaluation includes at minimum, Doppler waveform signals and systolic blood pressure reading at the level of bilateral brachial, anterior tibial, and posterior tibial arteries, when vessel segments are  accessible. Bilateral testing is considered an integral part of a complete examination. Photoelectric Plethysmograph (PPG) waveforms and toe systolic pressure readings are included as required and additional duplex testing as needed. Limited examinations for reoccurring indications may be performed as noted.  ABI Findings: +---------+------------------+-----+---------+--------+ Right    Rt Pressure (mmHg)IndexWaveform Comment  +---------+------------------+-----+---------+--------+ Brachial 125                                      +---------+------------------+-----+---------+--------+ ATA      134               0.87 biphasic          +---------+------------------+-----+---------+--------+ PTA      135               0.88 triphasic         +---------+------------------+-----+---------+--------+ Great Toe75                0.49                   +---------+------------------+-----+---------+--------+ +---------+------------------+-----+--------+---------------------------+ Left     Lt Pressure (mmHg)IndexWaveformComment                     +---------+------------------+-----+--------+---------------------------+ Brachial 154                                                        +---------+------------------+-----+--------+---------------------------+ ATA      144               0.94                                     +---------+------------------+-----+--------+---------------------------+ PTA      104  0.68                                     +---------+------------------+-----+--------+---------------------------+ Great Toe                               trans metatarsal amputation +---------+------------------+-----+--------+---------------------------+ +-------+-----------+-----------+------------+------------+ ABI/TBIToday's ABIToday's TBIPrevious ABIPrevious TBI +-------+-----------+-----------+------------+------------+ Right  0.88        0.49       0.99        0.73         +-------+-----------+-----------+------------+------------+ Left   0.94                  0.74        0.0          +-------+-----------+-----------+------------+------------+ Compared to prior study on 04/05/2019. Left ABIs appear increased compared to prior study on 04/05/2019.  Summary: Right: Resting right ankle-brachial index indicates mild right lower extremity arterial disease. The right toe-brachial index is abnormal. RT great toe pressure = 75 mmHg. Left: Resting left ankle-brachial index indicates mild left lower extremity arterial disease.  *See table(s) above for measurements and observations.  Electronically signed by Monica Martinez MD on 05/31/2019 at 11:39:44 AM.    Final    Vas US Aorta/ivc/iliacs  Result Date: 05/31/2019 ABDOMINAL AORTA STUDY Risk Factors: Past history of smoking. Vascular Interventions: Left common iliac artery stent and trans metatarsal                         amputation 04/11/2019. Limitations: Patient discomfort.  Performing Technologist: Delorise Shiner RVT  Examination Guidelines: A complete evaluation includes B-mode imaging, spectral Doppler, color Doppler, and power Doppler as needed of all accessible portions of each vessel. Bilateral testing is considered an integral part of a complete examination. Limited examinations for reoccurring indications may be performed as noted.  Abdominal Aorta Findings: +-----------+-------+----------+----------+--------+--------+--------+ Location   AP (cm)Trans (cm)PSV (cm/s)WaveformThrombusComments +-----------+-------+----------+----------+--------+--------+--------+ Mid                         67                                 +-----------+-------+----------+----------+--------+--------+--------+ Distal                      72                                 +-----------+-------+----------+----------+--------+--------+--------+ RT CIA Prox                  140                                +-----------+-------+----------+----------+--------+--------+--------+ RT CIA Mid                  245                                +-----------+-------+----------+----------+--------+--------+--------+  Left Stent(s): +----------------------------+--------+--------+--------+--------+ Proximal common iliac arteryPSV cm/sStenosisWaveformComments +----------------------------+--------+--------+--------+--------+ Prox to Stent  108                              +----------------------------+--------+--------+--------+--------+ Proximal Stent              135                              +----------------------------+--------+--------+--------+--------+ Mid Stent                   113                              +----------------------------+--------+--------+--------+--------+ Distal Stent                111                              +----------------------------+--------+--------+--------+--------+ Distal to Stent             204                              +----------------------------+--------+--------+--------+--------+ No evidence of restenosis.  Summary: Stenosis: +------------------+-------------+-----------+ Location          Stenosis     Stent       +------------------+-------------+-----------+ Right Common Iliac<50% stenosis            +------------------+-------------+-----------+ Left Common Iliac              no stenosis +------------------+-------------+-----------+  *See table(s) above for measurements and observations.  Electronically signed by Monica Martinez MD on 05/31/2019 at 11:40:53 AM.    Final     Assessment and Plan:   1. Abnormal CT showing scattered soft tissue masses in left hepatic lobe, right kidney, and the right adrenal gland.  Fullness of the left hilum suspicious for left hilar mass and small nodules at the right lung base.  Also noted is signs of right  pyelonephritis. Findings suspicious for a primary lung cancer. Renal cell also a differential.  2. Anemia 3.  Urinary tract infection 4. PAD, status post right forefoot amputation 05/06/2019   -Agree with biopsy of liver mass by IR.,  Will need to be off of Plavix for a liver or renal biopsy -Proceed with CT scan of the chest with contrast to complete her staging work-up. -Await biopsy results for further treatment recommendations.  -Monitor hemoglobin. Transfuse for Hemoglobin <7.0 or active bleeding.  -Antibiotics per primary team. -Narcotic analgesic for pain  Thank you for this referral.   Mikey Bussing, DNP, AGPCNP-BC, AOCNP Barbara Watkins was interviewed and examined.  I reviewed the CT images.  She presents with right-sided abdomen/flank pain secondary to liver and renal/adrenal masses.  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  The differential diagnosis includes renal cell carcinoma, lung cancer, and other primary tumor sites.  We need to obtain a tissue diagnosis in order to develop a treatment plan.  Outpatient follow-up will be scheduled at the Cancer center.

## 2019-06-14 ENCOUNTER — Encounter: Payer: Self-pay | Admitting: Nurse Practitioner

## 2019-06-14 ENCOUNTER — Telehealth: Payer: Self-pay | Admitting: Nurse Practitioner

## 2019-06-14 DIAGNOSIS — K5900 Constipation, unspecified: Secondary | ICD-10-CM

## 2019-06-14 LAB — CBC WITH DIFFERENTIAL/PLATELET
Abs Immature Granulocytes: 0.09 10*3/uL — ABNORMAL HIGH (ref 0.00–0.07)
Basophils Absolute: 0.1 10*3/uL (ref 0.0–0.1)
Basophils Relative: 1 %
Eosinophils Absolute: 3.4 10*3/uL — ABNORMAL HIGH (ref 0.0–0.5)
Eosinophils Relative: 22 %
HCT: 31.7 % — ABNORMAL LOW (ref 36.0–46.0)
Hemoglobin: 10 g/dL — ABNORMAL LOW (ref 12.0–15.0)
Immature Granulocytes: 1 %
Lymphocytes Relative: 9 %
Lymphs Abs: 1.4 10*3/uL (ref 0.7–4.0)
MCH: 25.4 pg — ABNORMAL LOW (ref 26.0–34.0)
MCHC: 31.5 g/dL (ref 30.0–36.0)
MCV: 80.7 fL (ref 80.0–100.0)
Monocytes Absolute: 0.8 10*3/uL (ref 0.1–1.0)
Monocytes Relative: 5 %
Neutro Abs: 9.8 10*3/uL — ABNORMAL HIGH (ref 1.7–7.7)
Neutrophils Relative %: 62 %
Platelets: 318 10*3/uL (ref 150–400)
RBC: 3.93 MIL/uL (ref 3.87–5.11)
RDW: 13.9 % (ref 11.5–15.5)
WBC: 15.6 10*3/uL — ABNORMAL HIGH (ref 4.0–10.5)
nRBC: 0 % (ref 0.0–0.2)

## 2019-06-14 LAB — BASIC METABOLIC PANEL
Anion gap: 9 (ref 5–15)
BUN: 9 mg/dL (ref 6–20)
CO2: 24 mmol/L (ref 22–32)
Calcium: 9.3 mg/dL (ref 8.9–10.3)
Chloride: 101 mmol/L (ref 98–111)
Creatinine, Ser: 0.68 mg/dL (ref 0.44–1.00)
GFR calc Af Amer: >60 mL/min (ref >60)
GFR calc non Af Amer: >60 mL/min (ref >60)
Glucose, Bld: 91 mg/dL (ref 70–99)
Potassium: 3.6 mmol/L (ref 3.5–5.1)
Sodium: 134 mmol/L — ABNORMAL LOW (ref 135–145)

## 2019-06-14 MED ORDER — KETOROLAC TROMETHAMINE 30 MG/ML IJ SOLN
15.0000 mg | Freq: Three times a day (TID) | INTRAMUSCULAR | Status: DC | PRN
  Administered 2019-06-15 (×3): 30 mg via INTRAVENOUS
  Filled 2019-06-14 (×3): qty 1

## 2019-06-14 MED ORDER — BISACODYL 10 MG RE SUPP
10.0000 mg | Freq: Once | RECTAL | Status: AC
  Administered 2019-06-14: 10 mg via RECTAL
  Filled 2019-06-14: qty 1

## 2019-06-14 MED ORDER — SODIUM CHLORIDE 0.9 % IV SOLN
8.0000 mg | Freq: Three times a day (TID) | INTRAVENOUS | Status: DC | PRN
  Filled 2019-06-14: qty 4

## 2019-06-14 NOTE — Plan of Care (Signed)
  Problem: Education: Goal: Knowledge of General Education information will improve Description Including pain rating scale, medication(s)/side effects and non-pharmacologic comfort measures Outcome: Progressing   

## 2019-06-14 NOTE — Progress Notes (Signed)
Per IR : will re-schedule lymph node biopsy tomorrow, will keep patient NPO post-midnight. MD was made aware

## 2019-06-14 NOTE — Progress Notes (Addendum)
HEMATOLOGY-ONCOLOGY PROGRESS NOTE  SUBJECTIVE: Having nausea today. Has vomited X1. Had Phenergan earlier today. No BM in 4-5 days. Pain better. Has no other complaints today.   REVIEW OF SYSTEMS:   Constitutional: Denies fevers, chills  Eyes: Denies blurriness of vision Ears, nose, mouth, throat, and face: Denies mucositis or sore throat Respiratory: Denies cough, dyspnea or wheezes Cardiovascular: Denies palpitation, chest discomfort Gastrointestinal:  Has nausea. Vomited X1. Has constipation.  Skin: Denies abnormal skin rashes Lymphatics: Denies new lymphadenopathy or easy bruising Neurological:Denies numbness, tingling or new weaknesses Behavioral/Psych: Mood is stable, no new changes  Extremities: No lower extremity edema All other systems were reviewed with the patient and are negative.  I have reviewed the past medical history, past surgical history, social history and family history with the patient and they are unchanged from previous note.   PHYSICAL EXAMINATION:  Vitals:   06/13/19 2122 06/14/19 0509  BP: (!) 126/91 122/62  Pulse: 80 82  Resp: 18 18  Temp: 98.4 F (36.9 C) 98.1 F (36.7 C)  SpO2: 99% 95%   Filed Weights   06/12/19 2012 06/13/19 0605 06/14/19 0500  Weight: 122 lb 12.7 oz (55.7 kg) 122 lb 9.2 oz (55.6 kg) 122 lb 9.2 oz (55.6 kg)    Intake/Output from previous day: 06/15 0701 - 06/16 0700 In: 237 [P.O.:237] Out: -   GENERAL:alert, no distress and comfortable SKIN: skin color, texture, turgor are normal, no rashes or significant lesions EYES: normal, Conjunctiva are pink and non-injected, sclera clear OROPHARYNX: No thrush LUNGS: clear to auscultation and percussion with normal breathing effort HEART: regular rate & rhythm and no murmurs and no lower extremity edema ABDOMEN:abdomen soft, non-tender and normal bowel sounds Musculoskeletal:no cyanosis of digits and no clubbing  NEURO: alert & oriented x 3 with fluent speech, no focal  motor/sensory deficits  LABORATORY DATA:  I have reviewed the data as listed CMP Latest Ref Rng & Units 06/14/2019 06/13/2019 06/12/2019  Glucose 70 - 99 mg/dL 91 105(H) 135(H)  BUN 6 - 20 mg/dL 9 7 11   Creatinine 0.44 - 1.00 mg/dL 0.68 0.68 0.75  Sodium 135 - 145 mmol/L 134(L) 137 135  Potassium 3.5 - 5.1 mmol/L 3.6 3.4(L) 3.7  Chloride 98 - 111 mmol/L 101 103 101  CO2 22 - 32 mmol/L 24 24 24   Calcium 8.9 - 10.3 mg/dL 9.3 9.4 9.9  Total Protein 6.5 - 8.1 g/dL - - 6.5  Total Bilirubin 0.3 - 1.2 mg/dL - - 0.4  Alkaline Phos 38 - 126 U/L - - 183(H)  AST 15 - 41 U/L - - 23  ALT 0 - 44 U/L - - 18    Lab Results  Component Value Date   WBC 15.6 (H) 06/14/2019   HGB 10.0 (L) 06/14/2019   HCT 31.7 (L) 06/14/2019   MCV 80.7 06/14/2019   PLT 318 06/14/2019   NEUTROABS 9.8 (H) 06/14/2019    Ct Chest W Contrast  Result Date: 06/13/2019 CLINICAL DATA:  Follow-up abdominal cancer, metastatic disease EXAM: CT CHEST WITH CONTRAST TECHNIQUE: Multidetector CT imaging of the chest was performed during intravenous contrast administration. CONTRAST:  60mL OMNIPAQUE IOHEXOL 300 MG/ML  SOLN COMPARISON:  CT abdomen pelvis, 06/12/2019 FINDINGS: Cardiovascular: Left coronary artery calcifications. Aortic atherosclerosis. Normal heart size. No pericardial effusion. Mediastinum/Nodes: There is a large, encasing soft tissue mass of the left hilum measuring at least 5.8 x 4.5 x 3.8 cm (series 3, image 73), which narrows the left mainstem bronchus and left pulmonary artery. There  are no discretely enlarged lymph nodes appreciated in the mediastinum. Thyroid gland, trachea, and esophagus demonstrate no significant findings. Lungs/Pleura: There is near-total obstructive atelectasis of the left upper lobe with a portion of the lingula remaining aerated. There are multiple bilateral pulmonary nodules, for example a 9 mm nodule of the right lower lobe (series 4, image 114). No pleural effusion or pneumothorax. Upper  Abdomen: Very large, centrally hypodense mass of the left lobe of the liver. Large right adrenal mass. Musculoskeletal: No chest wall mass or suspicious bone lesions identified. IMPRESSION: 1. There is a large, encasing soft tissue mass of the left hilum measuring at least 5.8 x 4.5 x 3.8 cm (series 3, image 73), which narrows the left mainstem bronchus and left pulmonary artery. There is near-total obstructive atelectasis of the left upper lobe with a portion of the lingula remaining aerated. There are multiple bilateral pulmonary nodules, for example a 9 mm nodule of the right lower lobe (series 4, image 114). Findings are consistent with primary left hilar lung malignancy and metastatic disease. 2. Hepatic and adrenal metastatic disease included in the upper abdomen, better evaluated by prior dedicated CT of the abdomen. 3.  Coronary artery disease. Electronically Signed   By: Eddie Candle M.D.   On: 06/13/2019 15:47   Ct Abdomen Pelvis W Contrast  Result Date: 06/12/2019 CLINICAL DATA:  Right flank pain over the past 2 weeks but worsening today EXAM: CT ABDOMEN AND PELVIS WITH CONTRAST TECHNIQUE: Multidetector CT imaging of the abdomen and pelvis was performed using the standard protocol following bolus administration of intravenous contrast. CONTRAST:  193mL OMNIPAQUE IOHEXOL 300 MG/ML  SOLN COMPARISON:  None. FINDINGS: Lower chest: On the scout image, there is fullness in the left hilum. The possibility of a hilar mass or hilar adenopathy is raised. 3 mm right lower lobe nodule on image 1/4, partially imaged. 1.0 by 0.6 by 0.8 cm (average diameter 0.8 cm, volume = 0.3 cm^3) lobe pulmonary nodule along the inferior pulmonary ligament, image 12/4. There are 2 small right middle lobe nodules in the 3-4 mm range the small subpleural nodule or lymph node along the major fissure. Hepatobiliary: 11.8 by 10.0 by 9.3 cm rim enhancing mass in the left hepatic lobe, image 23/3. Dilated left hepatic ducts versus less  likely thrombosed vessels distal to this mass. Hypoenhancement in the left hepatic lobe possibly from tumor vascularity. Gallbladder unremarkable. Pancreas: Unremarkable Spleen: Unremarkable Adrenals/Urinary Tract: 5.0 by 3.0 by 4.7 cm nonspecific mass of the right adrenal gland somewhat flattens the IVC, and is not easily separable from the adjacent 3.7 by 3.3 by 4.3 cm region of adenopathy which surrounds the right renal artery and part of the IVC. The left adrenal gland appears normal. Hypoenhancing 5.9 by 5.6 by 5.4 cm right renal mass on image 38/3. There is hypoenhancement of the parenchyma of the right kidney upper pole. 0.6 cm focal hypodensity within this hypoenhancing parenchyma on image 33/3. The right renal vein is narrowed by the adenopathy and adrenal mass. Posterior to the right kidney lower pole, a soft tissue density nodule in the perirenal space measures 1.3 cm in diameter on image 49/3. Posterior to the left kidney lower pole, a 0.8 cm soft tissue density nodule is shown on image 34/3. A nonspecific 1.0 cm hypodense lesion in the left kidney upper pole a separate 0.9 by 0.7 cm left mid kidney lesion is not entirely specific but more likely to be a cyst. Stomach/Bowel: Unremarkable Vascular/Lymphatic: A lymph node or  tumor deposit adjacent to the caudate lobe the upper portion of the porta hepatis measures 2.7 by 2.1 cm on image 22/3. Other abnormal porta hepatis lymph nodes are present. As noted previously there is a conglomerate lymph node surrounding the right renal artery, right renal vein, and part of the IVC. An aortocaval node measures 1.5 cm in short axis on image 39/3. Reproductive: Unremarkable Other: A soft tissue density right omental nodule measures 1.1 by 0.9 cm on image 53/3. Musculoskeletal: Lumbar spondylosis and degenerative disc disease at L4-5 and L5-S1. IMPRESSION: 1. Scattered soft tissue masses compatible with metastatic malignancy. A dominant tumor mass is in the left  hepatic lobe measuring 11.8 cm. There is 5.9 cm mass in the right kidney, a right adrenal mass, prominent retroperitoneal and porta hepatis adenopathy, and tumor nodules in the perirenal spaces, right omentum, and probably in the left kidney upper pole. In addition on the scout image there seems to be fullness of the left hilum suspicious for left hilar mass, and small nodules at the right lung base. Given the constellation of findings I would tend to favor lung cancer as the primary, although other entities such as hepatocellular carcinoma, metastatic renal cell carcinoma, cholangiocarcinoma, or melanoma could have a similar distribution and appearance. CT of the chest to further characterize is recommended. Tissue diagnosis recommended. 2. Adjacent to the right renal mass in the upper pole there is abnormal parenchymal hypodensity. Given the right flank pain, this is probably from pyelonephritis. Also the right renal vein is considerably narrowed and its possible that this low density is related to a venous drainage issue, although pyelonephritis is favored and might be predisposed by the tumor causing some narrowing of the subtending infundibula. There is a 6 mm hypodense focus in the right kidney upper pole, and renal abscess is not readily excluded. 3. Mildly dilated lateral segment left hepatic lobe ducts distal to the tumor, probably from obstruction due to the dominant left hepatic lobe mass. These results were called by telephone at the time of interpretation on 06/12/2019 at 4:34 pm to Benedetto Goad, [place], who verbally acknowledged these results. Electronically Signed   By: Van Clines M.D.   On: 06/12/2019 16:36   Vas [place] Burnard Bunting With/wo Tbi  Result Date: 05/31/2019 LOWER EXTREMITY DOPPLER STUDY Indications: Peripheral artery disease. High Risk Factors: Past history of smoking.  Vascular Interventions: Left common iliac artery stent and trans metatarsal                         amputation in the left foot  on 04/11/2019. Performing Technologist: Delorise Shiner RVT  Examination Guidelines: A complete evaluation includes at minimum, Doppler waveform signals and systolic blood pressure reading at the level of bilateral brachial, anterior tibial, and posterior tibial arteries, when vessel segments are accessible. Bilateral testing is considered an integral part of a complete examination. Photoelectric Plethysmograph (PPG) waveforms and toe systolic pressure readings are included as required and additional duplex testing as needed. Limited examinations for reoccurring indications may be performed as noted.  ABI Findings: +---------+------------------+-----+---------+--------+ Right    Rt Pressure (mmHg)IndexWaveform Comment  +---------+------------------+-----+---------+--------+ Brachial 125                                      +---------+------------------+-----+---------+--------+ ATA      134  0.87 biphasic          +---------+------------------+-----+---------+--------+ PTA      135               0.88 triphasic         +---------+------------------+-----+---------+--------+ Great Toe75                0.49                   +---------+------------------+-----+---------+--------+ +---------+------------------+-----+--------+---------------------------+ Left     Lt Pressure (mmHg)IndexWaveformComment                     +---------+------------------+-----+--------+---------------------------+ Brachial 154                                                        +---------+------------------+-----+--------+---------------------------+ ATA      144               0.94                                     +---------+------------------+-----+--------+---------------------------+ PTA      104               0.68                                     +---------+------------------+-----+--------+---------------------------+ Great Toe                                trans metatarsal amputation +---------+------------------+-----+--------+---------------------------+ +-------+-----------+-----------+------------+------------+ ABI/TBIToday's ABIToday's TBIPrevious ABIPrevious TBI +-------+-----------+-----------+------------+------------+ Right  0.88       0.49       0.99        0.73         +-------+-----------+-----------+------------+------------+ Left   0.94                  0.74        0.0          +-------+-----------+-----------+------------+------------+ Compared to prior study on 04/05/2019. Left ABIs appear increased compared to prior study on 04/05/2019.  Summary: Right: Resting right ankle-brachial index indicates mild right lower extremity arterial disease. The right toe-brachial index is abnormal. RT great toe pressure = 75 mmHg. Left: Resting left ankle-brachial index indicates mild left lower extremity arterial disease.  *See table(s) above for measurements and observations.  Electronically signed by Monica Martinez MD on 05/31/2019 at 11:39:44 AM.    Final    Vas US Aorta/ivc/iliacs  Result Date: 05/31/2019 ABDOMINAL AORTA STUDY Risk Factors: Past history of smoking. Vascular Interventions: Left common iliac artery stent and trans metatarsal                         amputation 04/11/2019. Limitations: Patient discomfort.  Performing Technologist: Delorise Shiner RVT  Examination Guidelines: A complete evaluation includes B-mode imaging, spectral Doppler, color Doppler, and power Doppler as needed of all accessible portions of each vessel. Bilateral testing is considered an integral part of a complete examination. Limited examinations for reoccurring indications may be performed as noted.  Abdominal Aorta Findings: +-----------+-------+----------+----------+--------+--------+--------+ Location  AP (cm)Trans (cm)PSV (cm/s)WaveformThrombusComments +-----------+-------+----------+----------+--------+--------+--------+ Mid                          67                                 +-----------+-------+----------+----------+--------+--------+--------+ Distal                      72                                 +-----------+-------+----------+----------+--------+--------+--------+ RT CIA Prox                 140                                +-----------+-------+----------+----------+--------+--------+--------+ RT CIA Mid                  245                                +-----------+-------+----------+----------+--------+--------+--------+  Left Stent(s): +----------------------------+--------+--------+--------+--------+ Proximal common iliac arteryPSV cm/sStenosisWaveformComments +----------------------------+--------+--------+--------+--------+ Prox to Stent               108                              +----------------------------+--------+--------+--------+--------+ Proximal Stent              135                              +----------------------------+--------+--------+--------+--------+ Mid Stent                   113                              +----------------------------+--------+--------+--------+--------+ Distal Stent                111                              +----------------------------+--------+--------+--------+--------+ Distal to Stent             204                              +----------------------------+--------+--------+--------+--------+ No evidence of restenosis.  Summary: Stenosis: +------------------+-------------+-----------+ Location          Stenosis     Stent       +------------------+-------------+-----------+ Right Common Iliac<50% stenosis            +------------------+-------------+-----------+ Left Common Iliac              no stenosis +------------------+-------------+-----------+  *See table(s) above for measurements and observations.  Electronically signed by Monica Martinez MD on 05/31/2019 at 11:40:53 AM.     Final     ASSESSMENT AND PLAN: 1. Abnormal CT showing scattered soft tissue masses in left hepatic lobe, right kidney, and the right adrenal gland. Fullness of the left hilum suspicious for left  hilar mass and small nodules at the right lung base. Also noted is signs of right pyelonephritis. Findings suspicious for a primary lung cancer. Renal cell also a differential.  2. Anemia 3.  Urinary tract infection 4. 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 Nausea 6. Constipation   -Await biopsy by IR. -Will consider CT or MRI of brain as an outpatient pending results of biopsy.  -Monitor hemoglobin. Transfuse for Hemoglobin <7.0 or active bleeding.  -Antibiotics per primary team. -Narcotic analgesic for pain -I have added Zofran for nausea. -Dulcolax suppository X1 for constipation.  -Outpatient follow-up at the [facility] has been arranged for 06/20/2019 at 215 pm.    LOS: 0 days   Mikey Bussing, DNP, AGPCNP-BC, AOCNP 06/14/19 The chest CT reveals a dominant left hilar mass.  I suspect lung cancer as a primary tumor site, but renal cell carcinoma remains a possibility.  I discontinued Plavix yesterday.  Interventional radiology is evaluating her for a biopsy.  I discussed the CT findings and differential diagnosis with Ms. Sotto.  We will see her as an outpatient and recommend treatment based on the tissue diagnosis.  Please call oncology as needed

## 2019-06-14 NOTE — Progress Notes (Signed)
Called for pt to come to radiology for LN biopsy. Transporter called when she was picking pt up and states that pt does not want to come down at this time because she does not feel well. Transporter states that she will check with pt nurse and call me back.  Transporter called this RN back and states that pt is refusing to come down today. I advised that we will try to bring pt down tomorrow as schedule allows, pt is appreciative of this information.

## 2019-06-14 NOTE — Progress Notes (Signed)
Patient refused to go down to radiology for lymph node biopsy.MD notified

## 2019-06-14 NOTE — Telephone Encounter (Signed)
Per staff msg a hospital fu appt has been scheduled for the pt to see Ned Card on 6/22 mat 215pm. Letter mailed.

## 2019-06-14 NOTE — Progress Notes (Addendum)
   Subjective: Patient stated that she has some naausea and vomited once. It then improved with antiemetic. She is aware that she should follow up with oncologist outpatient. She believes that she has been told today that her lymph node Bx will be canceled because she is nauseated. She otherwise feels ok and denies new pain or complains.   Objective:  Vital signs in last 24 hours: Vitals:   06/13/19 1414 06/13/19 2122 06/14/19 0500 06/14/19 0509  BP: 120/68 (!) 126/91  122/62  Pulse: 85 80  82  Resp: 16 18  18   Temp: 97.7 F (36.5 C) 98.4 F (36.9 C)  98.1 F (36.7 C)  TempSrc: Oral Oral  Oral  SpO2: 98% 99%  95%  Weight:   55.6 kg   Height:       Ph/E: General: No acute distress but appears mildly uncomfortable due to nausea CV: RRR, Nl S1S2, no murmur Pulm and chest:  Mild rhonchi, no wheezing or crackle Abdomen: Soft and nontender to palpation. Mild right CVA tenderness  Assessment/Plan:  Active Problems:   Metastatic cancer ([place])   Ms. Caporaso is a 58 y.o f with PAD s/p amputation who is admitted for treatment of pyelonephritis and further work-up of multiple abdominal and lung masses concerning for metastatic disease.    1.  Acute pyelonephritis: Has been on Ceftriaxone. UC negative so far. How ever unclear if it was obtain before or after starting AB. May switch to tmp-smxtomorrow for total duration 7 days. Urine culture without any grwoth likely due to it being sterile and be collected after abx were given. Afebrile and leukocytosis mildly improved to 15 today.  He had nausea and 1 episode of vomiting today. How ever does not have fever, or other sign or symptoms suggestive of worsening of pyelonephritis.  and can be 2/2 Tramadol.  -Continue with Ceftriaxone today and may switch to Bactrim tomorrow  -Stop Tramadol -PRN IV Toradol for pain -BMP daily  2.  Multiple Metastatic lesions (liver, kidney, adrenal and omentum as well as pulm. hillar mass) suspicious  for a primary lung cancer.  -Image guided left supraclavicular versus left axillary lymph node biopsy tomorrow by IR. Appreciate IR follow up for that. (the procedure was not performed today due to patient did not feel well and she then lost her schedule when felt better). Riskful deep tissue bx on anticoagulation.  I called patient's daughter for update. No response.  -CBC daily and monitor Hb. Transfuse for Hemoglobin <7.0 or active bleeding.  -NPO after midnight -Medical oncology is following.  Appreciate recommendations  -Consider CT or MRI of brain as an outpatient pending results of biopsy per Oncology recommendation - Ct Zofran PRN for nausea and Ct with Pain control -Outpatient follow-up at the [facility] has been arranged for 06/20/2019 at 215 pm.   Peripheral Vascular Disease: -Continue aspirin daily -Hold Plavix  FEN: Regular diet, NPO at midnight VTE ppx: Heparin Code Status: FULL  Dispo: Anticipated discharge in approximately 1-2 days  Dewayne Hatch, MD 06/14/2019, 5:29 AM Pager: [phone]

## 2019-06-15 ENCOUNTER — Inpatient Hospital Stay (HOSPITAL_COMMUNITY): Payer: Self-pay

## 2019-06-15 ENCOUNTER — Other Ambulatory Visit: Payer: Self-pay

## 2019-06-15 DIAGNOSIS — R160 Hepatomegaly, not elsewhere classified: Secondary | ICD-10-CM

## 2019-06-15 DIAGNOSIS — N2889 Other specified disorders of kidney and ureter: Secondary | ICD-10-CM

## 2019-06-15 DIAGNOSIS — N898 Other specified noninflammatory disorders of vagina: Secondary | ICD-10-CM

## 2019-06-15 LAB — CBC
HCT: 33.4 % — ABNORMAL LOW (ref 36.0–46.0)
Hemoglobin: 10.5 g/dL — ABNORMAL LOW (ref 12.0–15.0)
MCH: 25.2 pg — ABNORMAL LOW (ref 26.0–34.0)
MCHC: 31.4 g/dL (ref 30.0–36.0)
MCV: 80.1 fL (ref 80.0–100.0)
Platelets: 333 10*3/uL (ref 150–400)
RBC: 4.17 MIL/uL (ref 3.87–5.11)
RDW: 14.1 % (ref 11.5–15.5)
WBC: 19.6 10*3/uL — ABNORMAL HIGH (ref 4.0–10.5)
nRBC: 0 % (ref 0.0–0.2)

## 2019-06-15 MED ORDER — CIPROFLOXACIN HCL 500 MG PO TABS: 1000.0000 mg | ORAL_TABLET | Freq: Every day | ORAL | Status: DC

## 2019-06-15 MED ORDER — LIDOCAINE HCL (PF) 1 % IJ SOLN
INTRAMUSCULAR | Status: AC
  Filled 2019-06-15: qty 30

## 2019-06-15 MED ORDER — FENTANYL CITRATE (PF) 100 MCG/2ML IJ SOLN
INTRAMUSCULAR | Status: AC
  Filled 2019-06-15: qty 2

## 2019-06-15 MED ORDER — CLOPIDOGREL BISULFATE 75 MG PO TABS: 75.0000 mg | ORAL_TABLET | Freq: Every day | ORAL | Status: DC

## 2019-06-15 MED ORDER — HYDROMORPHONE HCL 2 MG PO TABS: 2.0000 mg | ORAL_TABLET | Freq: Three times a day (TID) | ORAL | 0 refills | Status: DC | PRN

## 2019-06-15 MED ORDER — FLUCONAZOLE 150 MG PO TABS
150.0000 mg | ORAL_TABLET | Freq: Once | ORAL | Status: AC
  Administered 2019-06-15: 15:00:00 150 mg via ORAL
  Filled 2019-06-15 (×2): qty 1

## 2019-06-15 MED ORDER — MIDAZOLAM HCL 2 MG/2ML IJ SOLN
INTRAMUSCULAR | Status: AC | PRN
  Administered 2019-06-15: 0.5 mg via INTRAVENOUS
  Administered 2019-06-15: 1 mg via INTRAVENOUS

## 2019-06-15 MED ORDER — CIPROFLOXACIN HCL 500 MG PO TABS
500.0000 mg | ORAL_TABLET | Freq: Two times a day (BID) | ORAL | Status: DC
  Administered 2019-06-15: 500 mg via ORAL
  Filled 2019-06-15: qty 1

## 2019-06-15 MED ORDER — ONDANSETRON HCL 4 MG PO TABS: 4.0000 mg | ORAL_TABLET | Freq: Three times a day (TID) | ORAL | 0 refills | Status: DC | PRN

## 2019-06-15 MED ORDER — FENTANYL CITRATE (PF) 100 MCG/2ML IJ SOLN
INTRAMUSCULAR | Status: AC | PRN
  Administered 2019-06-15 (×3): 25 ug via INTRAVENOUS

## 2019-06-15 MED ORDER — SULFAMETHOXAZOLE-TRIMETHOPRIM 800-160 MG PO TABS: 1.0000 | ORAL_TABLET | Freq: Two times a day (BID) | ORAL | 0 refills | Status: DC

## 2019-06-15 MED ORDER — IPRATROPIUM-ALBUTEROL 0.5-2.5 (3) MG/3ML IN SOLN: 3.0000 mL | Freq: Two times a day (BID) | RESPIRATORY_TRACT | Status: DC | PRN

## 2019-06-15 MED ORDER — MIDAZOLAM HCL 2 MG/2ML IJ SOLN
INTRAMUSCULAR | Status: AC
  Filled 2019-06-15: qty 2

## 2019-06-15 MED ORDER — SODIUM CHLORIDE 0.9 % IV SOLN
INTRAVENOUS | Status: AC | PRN
  Administered 2019-06-15: 10 mL/h via INTRAVENOUS

## 2019-06-15 MED ORDER — CIPROFLOXACIN HCL 500 MG PO TABS: 500.0000 mg | ORAL_TABLET | Freq: Two times a day (BID) | ORAL | 0 refills | Status: AC

## 2019-06-15 MED FILL — HYDROmorphone HCL 2 MG TABS: 2 | 5 days supply | Qty: 15 | Fill #0

## 2019-06-15 MED FILL — CIPROFLOXACIN HCL 500 MG TA: 500 | 10 days supply | Qty: 20 | Fill #0

## 2019-06-15 MED FILL — ONDANSETRON HCL 4 MG TABLET: 4 | 7 days supply | Qty: 20 | Fill #0

## 2019-06-15 NOTE — Procedures (Signed)
Interventional Radiology Procedure Note  Procedure: US Guided Biopsy of left supraclavicular lymph node  Complications: None  Estimated Blood Loss: < 10 mL  Findings: 50 G core biopsy of left supraclavicular lymph node performed under US guidance.  Three core samples obtained and sent to Pathology.  Barbara Watkins. Kathlene Cote, M.D Pager:  [phone]

## 2019-06-15 NOTE — Discharge Summary (Addendum)
Name: Barbara Watkins MRN: [id number] DOB: 1961/09/22 58 y.o. PCP: Patient, No Pcp Per  Date of Admission: 06/12/2019  1:56 PM Date of Discharge: 06/15/2019 Attending Physician: Dr. Dareen Piano Discharge Diagnosis: 1. Active Problems:   Metastatic cancer [facility])   Discharge Medications: Allergies as of 06/15/2019      Reactions   Percocet [oxycodone-acetaminophen] Other (See Comments)   Chest pressure and back, diaphoretic, lightheaded      Medication List    STOP taking these medications   traMADol 50 MG tablet Commonly known as: ULTRAM     TAKE these medications   aspirin EC 81 MG tablet Take 81 mg by mouth daily.   atorvastatin 20 MG tablet Commonly known as: Lipitor Take 1 tablet (20 mg total) by mouth daily.   bisacodyl 5 MG EC tablet Commonly known as: DULCOLAX Take 10 mg by mouth daily as needed (to move the bowels).   ciprofloxacin 500 MG tablet Commonly known as: CIPRO Take 1 tablet (500 mg total) by mouth 2 (two) times daily for 10 days.   clopidogrel 75 MG tablet Commonly known as: Plavix Take 1 tablet (75 mg total) by mouth daily.   famotidine 10 MG tablet Commonly known as: PEPCID Take 10 mg by mouth 2 (two) times daily as needed for heartburn or indigestion.   HYDROmorphone 2 MG tablet Commonly known as: Dilaudid Take 1 tablet (2 mg total) by mouth every 8 (eight) hours as needed for up to 5 days for severe pain.   naproxen sodium 220 MG tablet Commonly known as: ALEVE Take 220-440 mg by mouth 2 (two) times daily as needed (for pain).   ondansetron 4 MG tablet Commonly known as: Zofran Take 1 tablet (4 mg total) by mouth every 8 (eight) hours as needed for nausea or vomiting.       Disposition and follow-up:   Barbara Watkins was discharged from [facility] instable condition.  At the hospital follow up visit please address:  1. Consider CT or MRI of brain as an outpatient pending results of biopsyper Oncology  recommendation  2. Ensure she follows-up at the [facility] that has been arranged for 06/20/2019 at 215 pm.  3. She was initially on Tramadol for pain control that then stopped due to sever nausea by Tramadol. She is discharged with PO Dilaudid for 5 days until being seen by oncologist. Please reassessand manage pain.  4. Make sure she finishes AB (Ciprofloxacin) course (end date 6/27). Reassess for pyelonephritis and urinary symptoms.   Labs / imaging needed at time of follow-up: CBC, CMP, UA   Pending labs/ test needing follow-up: left supraclavicular lymph node   Follow-up Appointments: Follow-up [place] Follow up.   Why: June 23, 2019 at 1:30 PM BY TELEPHONE  Contact information: [place] [phone] [place] [facility] Course by problem list:  Pyelonephritis, complicated:  [name] is a 58 y.o female with peripheral artery disease s/p transmetatarsal amputation, blue toe syndrome, major depressive disorder who presented with a 3-week history of flank pain, dysuria, frequency, and urgency associated with nausea and decreased appetite. Urinalysis with hematuria proteinuria, rare bacteria. She had leukocytosis of 17.1 and found to have pyelonephritis (and kidney lesion as mentions below) on CT scan, and initially admited to the hospital with acute pyelonephritis and started with IV ceftriaxone. BC and UC was negaitive (How  ever unclear if UC obtained before or after starting AB.) She started to improve with antibiotic. transitioned to oral Ciprofloxacin at discharge to complete a 14-day course. (given complicated pyelonephritis due to probable obstruction 2/2 kidney lesion). She has no fevers and her nausea and flank pain resolved. Her CVA tenderness improved at discharge.  Multiple Metastatic lesions suspicious for a primary lung cancer: unfortunately, abdominal CT scan on  arrival also showed new multiple lesions in liver, right kidney, right adrenal, .. and chest CT scan showed pulmonary hillar mass and multiple pulm. nodules) which was concerning for metastasis. , suspicious for a primary lung cancer. Medical oncology consulted. Patient underwent biopsy of left supraclavicular lymph node. Result pending at DC. Patient scheduled for outpatient visit by Dr. Learta Codding , oncologist and biopsy result will be followed up. She is discharge with PRN pain medication, and antiemetic.   Discharge Vitals:   BP 127/80 (BP Location: Right Arm)   Pulse 100   Temp 98.2 F (36.8 C) (Oral)   Resp 20   Ht 5\' 4"  (1.626 m)   Wt 53.1 kg   SpO2 98%   BMI 20.09 kg/m    Pertinent Labs, Studies, and Procedures:   Component     Latest Ref Rng & Units 06/12/2019  Color, Urine     YELLOW YELLOW  Appearance     CLEAR CLEAR  Specific Gravity, Urine     1.005 - 1.030 >1.046 (H)  pH     5.0 - 8.0 6.0  Glucose, UA     NEGATIVE mg/dL NEGATIVE  Hgb urine dipstick     NEGATIVE SMALL (A)  Bilirubin Urine     NEGATIVE NEGATIVE  Ketones, ur     NEGATIVE mg/dL NEGATIVE  Protein     NEGATIVE mg/dL 100 (A)  Nitrite     NEGATIVE NEGATIVE  Leukocytes,Ua     NEGATIVE NEGATIVE  RBC / HPF     0 - 5 RBC/hpf 11-20  WBC, UA     0 - 5 WBC/hpf 6-10  Bacteria, UA     NONE SEEN RARE (A)  Squamous Epithelial / LPF     0 - 5 0-5  Mucus      PRESENT   Component     Latest Ref Rng & Units 06/15/2019  WBC     4.0 - 10.5 K/uL 19.6 (H)  RBC     3.87 - 5.11 MIL/uL 4.17  Hemoglobin     12.0 - 15.0 g/dL 10.5 (L)  HCT     36.0 - 46.0 % 33.4 (L)  MCV     80.0 - 100.0 fL 80.1  MCH     26.0 - 34.0 pg 25.2 (L)  MCHC     30.0 - 36.0 g/dL 31.4  RDW     11.5 - 15.5 % 14.1  Platelets     150 - 400 K/uL 333  nRBC     0.0 - 0.2 % 0.0  Neutrophils     %   NEUT#     1.7 - 7.7 K/uL   Lymphocytes     %   Lymphocyte #     0.7 - 4.0 K/uL   Monocytes Relative     %   Monocyte #      0.1 - 1.0 K/uL   Eosinophil     %   Eosinophils Absolute     0.0 - 0.5 K/uL   Basophil     %   Basophils Absolute  0.0 - 0.1 K/uL   Immature Granulocytes     %   Abs Immature Granulocytes     0.00 - 0.07 K/uL   Sodium     135 - 145 mmol/L   Potassium     3.5 - 5.1 mmol/L   Glucose     70 - 99 mg/dL    Component     Latest Ref Rng & Units 06/12/2019  Specimen Description      URINE, CLEAN CATCH  Special Requests      NONE  Culture      NO GROWTH . . .  Report Status      06/13/2019 FINAL    Ref Range & Units 7d ago  Lipase 11 - 51 U/L 20     Ct abdomen and pelvis: 06/12/2019 IMPRESSION: 1. Scattered soft tissue masses compatible with metastatic malignancy. A dominant tumor mass is in the left hepatic lobe measuring 11.8 cm. There is 5.9 cm mass in the right kidney, a right adrenal mass, prominent retroperitoneal and porta hepatis adenopathy, and tumor nodules in the perirenal spaces, right omentum, and probably in the left kidney upper pole. In addition on the scout image there seems to be fullness of the left hilum suspicious for left hilar mass, and small nodules at the right lung base. Given the constellation of findings I would tend to favor lung cancer as the primary, although other entities such as hepatocellular carcinoma, metastatic renal cell carcinoma, cholangiocarcinoma, or melanoma could have a similar distribution and appearance. CT of the chest to further characterize is recommended. Tissue diagnosis recommended. 2. Adjacent to the right renal mass in the upper pole there is abnormal parenchymal hypodensity. Given the right flank pain, this is probably from pyelonephritis. Also the right renal vein is considerably narrowed and its possible that this low density is related to a venous drainage issue, although pyelonephritis is favored and might be predisposed by the tumor causing some narrowing of the subtending infundibula. There is a 6 mm  hypodense focus in the right kidney upper pole, and renal abscess is not readily excluded. 3. Mildly dilated lateral segment left hepatic lobe ducts distal to the tumor, probably from obstruction due to the dominant left hepatic lobe mass.  These results were called by telephone at the time of interpretation on 06/12/2019 at 4:34 pm to Benedetto Goad, [place], who verbally acknowledged these results.   CT chest w contrast 06/13/2019 1. There is a large, encasing soft tissue mass of the left hilum measuring at least 5.8 x 4.5 x 3.8 cm (series 3, image 73), which narrows the left mainstem bronchus and left pulmonary artery. There is near-total obstructive atelectasis of the left upper lobe with a portion of the lingula remaining aerated. There are multiple bilateral pulmonary nodules, for example a 9 mm nodule of the right lower lobe (series 4, image 114). Findings are consistent with primary left hilar lung malignancy and metastatic disease.  2. Hepatic and adrenal metastatic disease included in the upper abdomen, better evaluated by prior dedicated CT of the abdomen.  3.  Coronary artery disease.   Result History  CT CHEST W CONTRAST (Order #[id number]) on 06/13/2019 - Order Result History Report    Interventional Radiology Procedure Note  Procedure: US Guided Biopsy of left supraclavicular lymph node  Complications: None Estimated Blood Loss: < 10 mL  Findings: 74 G core biopsy of left supraclavicular lymph node performed under US guidance.  Three core samples obtained and sent to  Pathology.  Discharge Instructions: Discharge Instructions    Call MD for:  persistant nausea and vomiting   Complete by: As directed    Call MD for:  severe uncontrolled pain   Complete by: As directed    Call MD for:  temperature >100.4   Complete by: As directed    Diet - low sodium heart healthy   Complete by: As directed    Diet - low sodium heart healthy   Complete by: As directed     Discharge instructions   Complete by: As directed    Thank you for allowing [place] taking care of you at [facility]. Please take Ciprofloxacin as instructed for your kidney infection. I prescribe you pain medicine and zofran for nausea to be taken as needed. Please make sure to follow up with oncologist as scheduled for you on 6/22. Also follow up with your PCP within 2 weeks. You can call us at [phone] if you have any questions or concern. Take care and good luck.   Increase activity slowly   Complete by: As directed    Increase activity slowly   Complete by: As directed       Signed: Dewayne Hatch, MD 06/19/2019, 11:15 AM   Pager: [phone]

## 2019-06-15 NOTE — Discharge Planning (Signed)
Patient discharged home in stable condition. Verbalizes understanding of all discharge instructions, including home medications and follow up appointments. 

## 2019-06-15 NOTE — Progress Notes (Signed)
   Subjective:   Ms. Sizer stated that she does not have any nausea and vomiting. She mentioned that she has been having some vaginal itching, but no discharge. She does not have any burning sensation when urinating.   Objective:  Vital signs in last 24 hours: Vitals:   06/14/19 0509 06/14/19 1552 06/14/19 2021 06/15/19 0447  BP: 122/62 (!) 152/74 (!) 148/71 137/77  Pulse: 82 93 99 99  Resp: 18 18 16 16   Temp: 98.1 F (36.7 C) 98.1 F (36.7 C) 98.9 F (37.2 C) 98 F (36.7 C)  TempSrc: Oral Oral Oral Oral  SpO2: 95% 95% 96% 93%  Weight:    53.1 kg  Height:       Physical Exam  Constitutional: Appears well-developed and well-nourished. No distress.  HENT:  Head: Normocephalic and atraumatic.  Eyes: Conjunctivae are normal.  Cardiovascular: Normal rate, regular rhythm and normal heart sounds.  Respiratory: Effort normal and breath sounds normal. No respiratory distress. No wheezes.  GI: Soft. Bowel sounds are normal. No distension. Mild ruq abdominal tenderness.  Musculoskeletal: No edema. Mild right flank pain Neurological: Is alert.  Skin: Not diaphoretic. No erythema.  Psychiatric: Normal mood and affect. Behavior is normal. Judgment and thought content normal.   Assessment/Plan:  Acute pyelonephritis Patient has complicated acute painless nephritis due to her renal mass which might be causing some obstruction.  Therefore, will treat for a longer duration.    Patient continues to have some mild tenderness in right flank and in abdomen.  He is currently being treated with ceftriaxone which we will convert to oral antibiotic.  She has a increase in WBC count to 19.6 from prior 15.6 which is likely due to combination of stress reaction from nausea and feeling of being unwell and also from underlying malignancy.   -Has had gotten 3 days of ceftriaxone -Converted to ciprofloxacin 1000mg  qd, 11 more days   Multiple Metastatic lesions (liver, kidney, adrenal and omentum as  well aspulm. hillar mass) Thought to be primary pulmonary malignancy vs rcc. IR did left supraclavicular lymph node biopsy this morning 6/17 which will be sent to pathology.   -patient to follow with Dr. Benay Spice (hem-onc) outpatient   Peripheral Vascular Disease: -Continue aspirin daily -resume plavix 75mg  today 6/17  Vaginal itching Likely secondary to candidal infection given she has been on antibiotics.   -Fluconazole 150mg  po once given   Dispo: Anticipated discharge today.  Lars Mage, MD 06/15/2019, 8:59 AM Pager: [phone]

## 2019-06-16 LAB — PATHOLOGIST SMEAR REVIEW: Path Review: 6152020

## 2019-06-20 ENCOUNTER — Telehealth: Payer: Self-pay

## 2019-06-20 ENCOUNTER — Other Ambulatory Visit: Payer: Self-pay | Admitting: Nurse Practitioner

## 2019-06-20 ENCOUNTER — Other Ambulatory Visit: Payer: Self-pay

## 2019-06-20 ENCOUNTER — Encounter: Payer: Self-pay | Admitting: Nurse Practitioner

## 2019-06-20 ENCOUNTER — Inpatient Hospital Stay: Payer: Self-pay | Attending: Nurse Practitioner | Admitting: Nurse Practitioner

## 2019-06-20 VITALS — BP 122/95 | HR 120 | Temp 97.8°F | Resp 18 | Ht 64.0 in | Wt 118.0 lb

## 2019-06-20 DIAGNOSIS — R918 Other nonspecific abnormal finding of lung field: Secondary | ICD-10-CM | POA: Insufficient documentation

## 2019-06-20 DIAGNOSIS — C3492 Malignant neoplasm of unspecified part of left bronchus or lung: Secondary | ICD-10-CM

## 2019-06-20 DIAGNOSIS — D649 Anemia, unspecified: Secondary | ICD-10-CM | POA: Insufficient documentation

## 2019-06-20 DIAGNOSIS — C3412 Malignant neoplasm of upper lobe, left bronchus or lung: Secondary | ICD-10-CM | POA: Insufficient documentation

## 2019-06-20 DIAGNOSIS — C3490 Malignant neoplasm of unspecified part of unspecified bronchus or lung: Secondary | ICD-10-CM | POA: Insufficient documentation

## 2019-06-20 DIAGNOSIS — C799 Secondary malignant neoplasm of unspecified site: Secondary | ICD-10-CM

## 2019-06-20 DIAGNOSIS — N390 Urinary tract infection, site not specified: Secondary | ICD-10-CM | POA: Insufficient documentation

## 2019-06-20 DIAGNOSIS — C787 Secondary malignant neoplasm of liver and intrahepatic bile duct: Secondary | ICD-10-CM | POA: Insufficient documentation

## 2019-06-20 DIAGNOSIS — C7971 Secondary malignant neoplasm of right adrenal gland: Secondary | ICD-10-CM | POA: Insufficient documentation

## 2019-06-20 DIAGNOSIS — Z79899 Other long term (current) drug therapy: Secondary | ICD-10-CM | POA: Insufficient documentation

## 2019-06-20 DIAGNOSIS — Z7189 Other specified counseling: Secondary | ICD-10-CM | POA: Insufficient documentation

## 2019-06-20 MED ORDER — CYANOCOBALAMIN 1000 MCG/ML IJ SOLN
1000.0000 ug | Freq: Once | INTRAMUSCULAR | Status: AC
  Administered 2019-06-20: 16:00:00 1000 ug via SUBCUTANEOUS

## 2019-06-20 MED ORDER — HYDROMORPHONE HCL 2 MG PO TABS: 2.0000 mg | ORAL_TABLET | Freq: Three times a day (TID) | ORAL | 0 refills | Status: DC | PRN

## 2019-06-20 MED ORDER — CYANOCOBALAMIN 1000 MCG/ML IJ SOLN
INTRAMUSCULAR | Status: AC
  Filled 2019-06-20: qty 1

## 2019-06-20 MED ORDER — FOLIC ACID 1 MG PO TABS: 1.0000 mg | ORAL_TABLET | Freq: Every day | ORAL | 6 refills | Status: DC

## 2019-06-20 NOTE — Telephone Encounter (Signed)
-----   Message from Owens Shark, NP sent at 06/20/2019  2:46 PM EDT ----- Please contact pathology-in addition to foundation 1 testing please add PD1

## 2019-06-20 NOTE — Progress Notes (Signed)
[place] OFFICE PROGRESS NOTE   Diagnosis: Non-small cell lung cancer  INTERVAL HISTORY:   Barbara Watkins returns for first outpatient follow-up appointment since discharge from the hospital.  Overall she feels "good".  She describes her appetite as "so-so".  She continues to have right-sided abdominal pain.  She takes hydromorphone as needed.  Bowels moving regularly.  No nausea or vomiting.  No shortness of breath.  She has a slight cough.  No fever.  Objective:  Vital signs in last 24 hours:  Blood pressure (!) 122/95, pulse (!) 120, temperature 97.8 F (36.6 C), temperature source Temporal, resp. rate 18, height 5\' 4"  (1.626 m), weight 118 lb (53.5 kg), SpO2 99 %.    HEENT: No thrush or ulcers. Resp: Lungs clear bilaterally. Cardio: Regular rate and rhythm. GI: Abdomen soft.  Tender right lateral abdomen.  No hepatomegaly. Vascular: No leg edema.  Calves soft and nontender. Neuro: Alert and oriented.   Lab Results:  Lab Results  Component Value Date   WBC 19.6 (H) 06/15/2019   HGB 10.5 (L) 06/15/2019   HCT 33.4 (L) 06/15/2019   MCV 80.1 06/15/2019   PLT 333 06/15/2019   NEUTROABS 9.8 (H) 06/14/2019    Imaging:  No results found.  Medications: I have reviewed the patient's current medications.  Assessment/Plan: 1.  Metastatic non-small cell lung cancer   CT abdomen/pelvis 06/12/2019-scattered soft tissue masses in left hepatic lobe, right kidney, and the right adrenal gland. Fullness of the left hilum suspicious for left hilar mass and small nodules at the right lung base. Also noted signs of right pyelonephritis.   CT chest 06/13/2019- large encasing soft tissue mass left hilum measuring at least 5.8 x 4.5 x 3.8 cm with narrowing of the left mainstem bronchus and left pulmonary artery.  Near-total obstructive atelectasis of the left upper lobe with a portion of the lingula remaining aerated.  Multiple bilateral pulmonary nodules.  Biopsy left  supraclavicular lymph node 06/15/2019- metastatic lung adenocarcinoma; immunohistochemistry positive for cytokeratin 7, TTF-1, PAX 8 (weak).  Napsin-A and CDX-2 negative. 2. Anemia 3.Urinary tract infection 4. 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 Nausea 6. Constipation 7.  Right abdominal pain secondary to #1  Disposition: Barbara Watkins has been diagnosed with metastatic non-small cell lung cancer.  Dr. Benay Spice reviewed the diagnosis, prognosis and treatment options with her at today's visit.  Her daughter was on the phone.  They understand that no therapy will be curative.  Dr. Benay Spice recommends systemic therapy with a combination of carboplatin/Alimta/pembrolizumab.  We reviewed potential toxicities associated with chemotherapy including bone marrow toxicity, hair loss, nausea, allergic reaction, mouth sores, rash.  She understands the rationale for L97 and folic acid.  We reviewed potential toxicities associated with immunotherapy including allergic reaction, rash, diarrhea/colitis, endocrinopathies, hepatitis, arthritis, pneumonitis.  She agrees to proceed.  She will attend a chemotherapy education class.  First B12 injection was administered today.  She will begin folic acid 1 mg daily.  We are referring her for a staging brain CT.  We are requesting foundation 1/PD1 testing.  She will return for the first cycle of treatment on 06/29/2019.  We will see her prior to cycle 2 on 07/20/2019.  Patient seen with Dr. Benay Spice.  25 minutes were spent face-to-face at today's visit with the majority of that time involved in counseling/coordination of care.    Ned Card ANP/GNP-BC   06/20/2019  4:07 PM This was a shared visit with Ned Card.  The  left supraclavicular lymph node biopsy confirms a diagnosis of metastatic adenocarcinoma, consistent with a lung primary.  The clinical presentation is consistent with metastatic non-small cell lung cancer.  We discussed the prognosis and  treatment options with Barbara Watkins.  Her daughter was present for the discussion by telephone.  I recommend initiating systemic therapy with Alimta/carboplatin/pembrolizumab.  We have submitted the lymph node pathology for PDL 1 and Foundation 1 testing.  We reviewed potential toxicities associated with the above systemic therapy regimen.  She agrees to proceed.  She will attend a chemotherapy teaching class.  Barbara Watkins will be referred for a staging brain CT.  [name], MD

## 2019-06-20 NOTE — Progress Notes (Signed)
START OFF PATHWAY REGIMEN - Non-Small Cell Lung   [id number]:Pembrolizumab 200 mg  IV D1 + Pemetrexed 500 mg/m2 IV D1 + Carboplatin AUC=5 IV D1 q21 Days:   A cycle is every 21 days:     Pembrolizumab      Pemetrexed      Carboplatin   **Always confirm dose/schedule in your pharmacy ordering system**  Patient Characteristics: Stage IV Metastatic, Nonsquamous, Initial Chemotherapy/Immunotherapy, PS = 0, 1, ALK or EGFR or ROS1 or NTRK Genomic Alterations - Awaiting Test Results AJCC T Category: Staged < 8th Ed. Current Disease Status: Distant Metastases AJCC N Category: Staged < 8th Ed. AJCC M Category: Staged < 8th Ed. AJCC 8 Stage Grouping: Staged < 8th Ed. Histology: Nonsquamous Cell ROS1 Rearrangement Status: Awaiting Test Results T790M Mutation Status: Not Applicable - EGFR Mutation Negative/Unknown Other Mutations/Biomarkers: No Other Actionable Mutations NTRK Gene Fusion Status: Awaiting Test Results PD-L1 Expression Status: Awaiting Test Results Chemotherapy/Immunotherapy LOT: Initial Chemotherapy/Immunotherapy Molecular Targeted Therapy: Not Appropriate ALK Rearrangement Status: Awaiting Test Results EGFR Mutation Status: Awaiting Test Results BRAF V600E Mutation Status: Awaiting Test Results ECOG Performance Status: 1 Intent of Therapy: Non-Curative / Palliative Intent, Discussed with Patient

## 2019-06-20 NOTE — Telephone Encounter (Signed)
Spoke with pathology PD1 will be added to the foundation 1 testing.

## 2019-06-21 ENCOUNTER — Encounter: Payer: Self-pay | Admitting: *Deleted

## 2019-06-21 ENCOUNTER — Telehealth: Payer: Self-pay | Admitting: Nurse Practitioner

## 2019-06-21 NOTE — Progress Notes (Signed)
Oncology Nurse Navigator Documentation  Oncology Nurse Navigator Flowsheets 06/21/2019  Navigator Location [facility]  Navigator Encounter Type Other/I requested Foundation one and PDL 1 on [name] per Ned Card  Treatment Phase Pre-Tx/Tx Discussion  Barriers/Navigation Needs Coordination of Care  Interventions Coordination of Care  Coordination of Care Other  Acuity Level 2  Time Spent with Patient 30

## 2019-06-21 NOTE — Telephone Encounter (Signed)
Scheduled appts per los. Called and spoke with patient. Confirmed dates and times

## 2019-06-24 ENCOUNTER — Inpatient Hospital Stay: Payer: Medicaid Other

## 2019-06-24 ENCOUNTER — Telehealth: Payer: Self-pay | Admitting: Nurse Practitioner

## 2019-06-24 NOTE — Telephone Encounter (Signed)
Called pt per 6/26 sch message - unable to reach pt  - left message for pt to call back to reschedule.

## 2019-06-25 ENCOUNTER — Other Ambulatory Visit: Payer: Self-pay | Admitting: Oncology

## 2019-06-27 ENCOUNTER — Telehealth: Payer: Self-pay | Admitting: *Deleted

## 2019-06-27 ENCOUNTER — Inpatient Hospital Stay: Payer: Medicaid Other

## 2019-06-28 ENCOUNTER — Ambulatory Visit (HOSPITAL_COMMUNITY)
Admission: RE | Admit: 2019-06-28 | Discharge: 2019-06-28 | Disposition: A | Discharge status: Home / Self Care | Payer: Self-pay | Source: Ambulatory Visit | Attending: Nurse Practitioner | Admitting: Nurse Practitioner

## 2019-06-28 ENCOUNTER — Encounter (HOSPITAL_COMMUNITY): Payer: Self-pay

## 2019-06-28 ENCOUNTER — Other Ambulatory Visit: Payer: Self-pay

## 2019-06-28 DIAGNOSIS — C799 Secondary malignant neoplasm of unspecified site: Secondary | ICD-10-CM

## 2019-06-28 MED ORDER — SODIUM CHLORIDE (PF) 0.9 % IJ SOLN
INTRAMUSCULAR | Status: AC
  Filled 2019-06-28: qty 50

## 2019-06-28 MED ORDER — IOHEXOL 300 MG/ML  SOLN: 75.0000 mL | Freq: Once | INTRAMUSCULAR | Status: DC | PRN

## 2019-06-29 ENCOUNTER — Encounter (HOSPITAL_COMMUNITY): Payer: Self-pay

## 2019-06-29 ENCOUNTER — Inpatient Hospital Stay: Payer: Self-pay | Attending: Nurse Practitioner

## 2019-06-29 ENCOUNTER — Ambulatory Visit (HOSPITAL_COMMUNITY)
Admission: RE | Admit: 2019-06-29 | Discharge: 2019-06-29 | Disposition: A | Discharge status: Home / Self Care | Payer: Self-pay | Source: Ambulatory Visit | Attending: Nurse Practitioner | Admitting: Nurse Practitioner

## 2019-06-29 ENCOUNTER — Telehealth: Payer: Self-pay | Admitting: Nurse Practitioner

## 2019-06-29 ENCOUNTER — Other Ambulatory Visit: Payer: Self-pay

## 2019-06-29 ENCOUNTER — Other Ambulatory Visit: Payer: Self-pay | Admitting: *Deleted

## 2019-06-29 ENCOUNTER — Inpatient Hospital Stay: Payer: Self-pay

## 2019-06-29 ENCOUNTER — Encounter (HOSPITAL_COMMUNITY): Payer: Self-pay | Admitting: Oncology

## 2019-06-29 ENCOUNTER — Encounter: Payer: Self-pay | Admitting: *Deleted

## 2019-06-29 VITALS — BP 125/79 | HR 101 | Temp 99.1°F | Resp 18

## 2019-06-29 DIAGNOSIS — C3492 Malignant neoplasm of unspecified part of left bronchus or lung: Secondary | ICD-10-CM

## 2019-06-29 DIAGNOSIS — R112 Nausea with vomiting, unspecified: Secondary | ICD-10-CM | POA: Insufficient documentation

## 2019-06-29 DIAGNOSIS — K5900 Constipation, unspecified: Secondary | ICD-10-CM | POA: Insufficient documentation

## 2019-06-29 DIAGNOSIS — R21 Rash and other nonspecific skin eruption: Secondary | ICD-10-CM | POA: Insufficient documentation

## 2019-06-29 DIAGNOSIS — C799 Secondary malignant neoplasm of unspecified site: Secondary | ICD-10-CM | POA: Insufficient documentation

## 2019-06-29 DIAGNOSIS — D649 Anemia, unspecified: Secondary | ICD-10-CM | POA: Insufficient documentation

## 2019-06-29 DIAGNOSIS — Z5111 Encounter for antineoplastic chemotherapy: Secondary | ICD-10-CM | POA: Insufficient documentation

## 2019-06-29 DIAGNOSIS — Z5112 Encounter for antineoplastic immunotherapy: Secondary | ICD-10-CM | POA: Insufficient documentation

## 2019-06-29 DIAGNOSIS — C3412 Malignant neoplasm of upper lobe, left bronchus or lung: Secondary | ICD-10-CM | POA: Insufficient documentation

## 2019-06-29 HISTORY — DX: Malignant (primary) neoplasm, unspecified: C80.1

## 2019-06-29 LAB — CMP (CANCER CENTER ONLY)
ALT: 26 U/L (ref 0–44)
AST: 43 U/L — ABNORMAL HIGH (ref 15–41)
Albumin: 2.8 g/dL — ABNORMAL LOW (ref 3.5–5.0)
Alkaline Phosphatase: 223 U/L — ABNORMAL HIGH (ref 38–126)
Anion gap: 12 (ref 5–15)
BUN: 10 mg/dL (ref 6–20)
CO2: 24 mmol/L (ref 22–32)
Calcium: 9.7 mg/dL (ref 8.9–10.3)
Chloride: 98 mmol/L (ref 98–111)
Creatinine: 0.86 mg/dL (ref 0.44–1.00)
GFR, Est AFR Am: >60 mL/min (ref >60)
GFR, Estimated: >60 mL/min (ref >60)
Glucose, Bld: 97 mg/dL (ref 70–99)
Potassium: 4.3 mmol/L (ref 3.5–5.1)
Sodium: 134 mmol/L — ABNORMAL LOW (ref 135–145)
Total Bilirubin: 0.6 mg/dL (ref 0.3–1.2)
Total Protein: 6.9 g/dL (ref 6.5–8.1)

## 2019-06-29 LAB — CBC WITH DIFFERENTIAL (CANCER CENTER ONLY)
Abs Immature Granulocytes: 0.18 10*3/uL — ABNORMAL HIGH (ref 0.00–0.07)
Basophils Absolute: 0.2 10*3/uL — ABNORMAL HIGH (ref 0.0–0.1)
Basophils Relative: 1 %
Eosinophils Absolute: 5.4 10*3/uL — ABNORMAL HIGH (ref 0.0–0.5)
Eosinophils Relative: 20 %
HCT: 35.9 % — ABNORMAL LOW (ref 36.0–46.0)
Hemoglobin: 11.3 g/dL — ABNORMAL LOW (ref 12.0–15.0)
Immature Granulocytes: 1 %
Lymphocytes Relative: 8 %
Lymphs Abs: 2.1 10*3/uL (ref 0.7–4.0)
MCH: 24.9 pg — ABNORMAL LOW (ref 26.0–34.0)
MCHC: 31.5 g/dL (ref 30.0–36.0)
MCV: 79.1 fL — ABNORMAL LOW (ref 80.0–100.0)
Monocytes Absolute: 1.5 10*3/uL — ABNORMAL HIGH (ref 0.1–1.0)
Monocytes Relative: 6 %
Neutro Abs: 17.1 10*3/uL — ABNORMAL HIGH (ref 1.7–7.7)
Neutrophils Relative %: 64 %
Platelet Count: 466 10*3/uL — ABNORMAL HIGH (ref 150–400)
RBC: 4.54 MIL/uL (ref 3.87–5.11)
RDW: 14.7 % (ref 11.5–15.5)
WBC Count: 26.4 10*3/uL — ABNORMAL HIGH (ref 4.0–10.5)
nRBC: 0 % (ref 0.0–0.2)

## 2019-06-29 MED ORDER — IOHEXOL 300 MG/ML  SOLN
75.0000 mL | Freq: Once | INTRAMUSCULAR | Status: AC | PRN
  Administered 2019-06-29: 14:00:00 75 mL via INTRAVENOUS

## 2019-06-29 MED ORDER — SODIUM CHLORIDE 0.9 % IV SOLN
510.0000 mg/m2 | Freq: Once | INTRAVENOUS | Status: AC
  Administered 2019-06-29: 800 mg via INTRAVENOUS
  Filled 2019-06-29: qty 20

## 2019-06-29 MED ORDER — SODIUM CHLORIDE 0.9 % IV SOLN
Freq: Once | INTRAVENOUS | Status: AC
  Administered 2019-06-29: 15:00:00 via INTRAVENOUS
  Filled 2019-06-29: qty 5

## 2019-06-29 MED ORDER — SODIUM CHLORIDE 0.9 % IV SOLN
422.5000 mg | Freq: Once | INTRAVENOUS | Status: AC
  Administered 2019-06-29: 420 mg via INTRAVENOUS
  Filled 2019-06-29: qty 42

## 2019-06-29 MED ORDER — HYDROMORPHONE HCL 4 MG/ML IJ SOLN
2.0000 mg | Freq: Once | INTRAMUSCULAR | Status: AC
  Administered 2019-06-29: 1 mg via INTRAVENOUS
  Filled 2019-06-29: qty 0.5

## 2019-06-29 MED ORDER — HYDROMORPHONE HCL 1 MG/ML IJ SOLN
INTRAMUSCULAR | Status: AC
  Filled 2019-06-29: qty 1

## 2019-06-29 MED ORDER — SODIUM CHLORIDE (PF) 0.9 % IJ SOLN
INTRAMUSCULAR | Status: AC
  Filled 2019-06-29: qty 50

## 2019-06-29 MED ORDER — SODIUM CHLORIDE 0.9 % IV SOLN
Freq: Once | INTRAVENOUS | Status: AC
  Administered 2019-06-29: 14:00:00 via INTRAVENOUS
  Filled 2019-06-29: qty 250

## 2019-06-29 MED ORDER — PALONOSETRON HCL INJECTION 0.25 MG/5ML
0.2500 mg | Freq: Once | INTRAVENOUS | Status: AC
  Administered 2019-06-29: 14:00:00 0.25 mg via INTRAVENOUS

## 2019-06-29 MED ORDER — SODIUM CHLORIDE 0.9 % IV SOLN
200.0000 mg | Freq: Once | INTRAVENOUS | Status: AC
  Administered 2019-06-29: 200 mg via INTRAVENOUS
  Filled 2019-06-29: qty 8

## 2019-06-29 MED ORDER — PALONOSETRON HCL INJECTION 0.25 MG/5ML
INTRAVENOUS | Status: AC
  Filled 2019-06-29: qty 5

## 2019-06-29 NOTE — Patient Instructions (Signed)
[facility] Discharge Instructions for Patients Receiving Chemotherapy  Today you received the following chemotherapy agents Keytruda, Alimta, and Carboplatin  To help prevent nausea and vomiting after your treatment, we encourage you to take your nausea medication as prescribed by MD. **DO NOT TAKE ZOFRAN FOR 3 DAYS AFTER CHEMOTHERAPY**   If you develop nausea and vomiting that is not controlled by your nausea medication, call the clinic.   BELOW ARE SYMPTOMS THAT SHOULD BE REPORTED IMMEDIATELY:  *FEVER GREATER THAN 100.5 F  *CHILLS WITH OR WITHOUT FEVER  NAUSEA AND VOMITING THAT IS NOT CONTROLLED WITH YOUR NAUSEA MEDICATION  *UNUSUAL SHORTNESS OF BREATH  *UNUSUAL BRUISING OR BLEEDING  TENDERNESS IN MOUTH AND THROAT WITH OR WITHOUT PRESENCE OF ULCERS  *URINARY PROBLEMS  *BOWEL PROBLEMS  UNUSUAL RASH Items with * indicate a potential emergency and should be followed up as soon as possible.  Feel free to call the clinic should you have any questions or concerns. The clinic phone number is [phone].  Please show the [facility] at check-in to the Emergency Department and triage nurse.  Carboplatin injection What is this medicine? CARBOPLATIN (KAR boe pla tin) is a chemotherapy drug. It targets fast dividing cells, like cancer cells, and causes these cells to die. This medicine is used to treat ovarian cancer and many other cancers. This medicine may be used for other purposes; ask your health care provider or pharmacist if you have questions. COMMON BRAND NAME(S): Paraplatin What should I tell my health care provider before I take this medicine? They need to know if you have any of these conditions:  blood disorders  hearing problems  kidney disease  recent or ongoing radiation therapy  an unusual or allergic reaction to carboplatin, cisplatin, other chemotherapy, other medicines, foods, dyes, or preservatives  pregnant or trying to get  pregnant  breast-feeding How should I use this medicine? This drug is usually given as an infusion into a vein. It is administered in a hospital or clinic by a specially trained health care professional. Talk to your pediatrician regarding the use of this medicine in children. Special care may be needed. Overdosage: If you think you have taken too much of this medicine contact a poison control center or emergency room at once. NOTE: This medicine is only for you. Do not share this medicine with others. What if I miss a dose? It is important not to miss a dose. Call your doctor or health care professional if you are unable to keep an appointment. What may interact with this medicine?  medicines for seizures  medicines to increase blood counts like filgrastim, pegfilgrastim, sargramostim  some antibiotics like amikacin, gentamicin, neomycin, streptomycin, tobramycin  vaccines Talk to your doctor or health care professional before taking any of these medicines:  acetaminophen  aspirin  ibuprofen  ketoprofen  naproxen This list may not describe all possible interactions. Give your health care provider a list of all the medicines, herbs, non-prescription drugs, or dietary supplements you use. Also tell them if you smoke, drink alcohol, or use illegal drugs. Some items may interact with your medicine. What should I watch for while using this medicine? Your condition will be monitored carefully while you are receiving this medicine. You will need important blood work done while you are taking this medicine. This drug may make you feel generally unwell. This is not uncommon, as chemotherapy can affect healthy cells as well as cancer cells. Report any side effects. Continue your course  of treatment even though you feel ill unless your doctor tells you to stop. In some cases, you may be given additional medicines to help with side effects. Follow all directions for their use. Call your  doctor or health care professional for advice if you get a fever, chills or sore throat, or other symptoms of a cold or flu. Do not treat yourself. This drug decreases your body's ability to fight infections. Try to avoid being around people who are sick. This medicine may increase your risk to bruise or bleed. Call your doctor or health care professional if you notice any unusual bleeding. Be careful brushing and flossing your teeth or using a toothpick because you may get an infection or bleed more easily. If you have any dental work done, tell your dentist you are receiving this medicine. Avoid taking products that contain aspirin, acetaminophen, ibuprofen, naproxen, or ketoprofen unless instructed by your doctor. These medicines may hide a fever. Do not become pregnant while taking this medicine. Women should inform their doctor if they wish to become pregnant or think they might be pregnant. There is a potential for serious side effects to an unborn child. Talk to your health care professional or pharmacist for more information. Do not breast-feed an infant while taking this medicine. What side effects may I notice from receiving this medicine? Side effects that you should report to your doctor or health care professional as soon as possible:  allergic reactions like skin rash, itching or hives, swelling of the face, lips, or tongue  signs of infection - fever or chills, cough, sore throat, pain or difficulty passing urine  signs of decreased platelets or bleeding - bruising, pinpoint red spots on the skin, black, tarry stools, nosebleeds  signs of decreased red blood cells - unusually weak or tired, fainting spells, lightheadedness  breathing problems  changes in hearing  changes in vision  chest pain  high blood pressure  low blood counts - This drug may decrease the number of white blood cells, red blood cells and platelets. You may be at increased risk for infections and  bleeding.  nausea and vomiting  pain, swelling, redness or irritation at the injection site  pain, tingling, numbness in the hands or feet  problems with balance, talking, walking  trouble passing urine or change in the amount of urine Side effects that usually do not require medical attention (report to your doctor or health care professional if they continue or are bothersome):  hair loss  loss of appetite  metallic taste in the mouth or changes in taste This list may not describe all possible side effects. Call your doctor for medical advice about side effects. You may report side effects to FDA at 1-800-FDA-1088. Where should I keep my medicine? This drug is given in a hospital or clinic and will not be stored at home. NOTE: This sheet is a summary. It may not cover all possible information. If you have questions about this medicine, talk to your doctor, pharmacist, or health care provider.  2020 Elsevier/Gold Standard (2008-03-21 14:38:05)   Pemetrexed injection What is this medicine? PEMETREXED (PEM e TREX ed) is a chemotherapy drug used to treat lung cancers like non-small cell lung cancer and mesothelioma. It may also be used to treat other cancers. This medicine may be used for other purposes; ask your health care provider or pharmacist if you have questions. COMMON BRAND NAME(S): Alimta What should I tell my health care provider before I take this  medicine? They need to know if you have any of these conditions:  infection (especially a virus infection such as chickenpox, cold sores, or herpes)  kidney disease  low blood counts, like low white cell, platelet, or red cell counts  lung or breathing disease, like asthma  radiation therapy  an unusual or allergic reaction to pemetrexed, other medicines, foods, dyes, or preservative  pregnant or trying to get pregnant  breast-feeding How should I use this medicine? This drug is given as an infusion into a vein.  It is administered in a hospital or clinic by a specially trained health care professional. Talk to your pediatrician regarding the use of this medicine in children. Special care may be needed. Overdosage: If you think you have taken too much of this medicine contact a poison control center or emergency room at once. NOTE: This medicine is only for you. Do not share this medicine with others. What if I miss a dose? It is important not to miss your dose. Call your doctor or health care professional if you are unable to keep an appointment. What may interact with this medicine? This medicine may interact with the following medications:  Ibuprofen This list may not describe all possible interactions. Give your health care provider a list of all the medicines, herbs, non-prescription drugs, or dietary supplements you use. Also tell them if you smoke, drink alcohol, or use illegal drugs. Some items may interact with your medicine. What should I watch for while using this medicine? Visit your doctor for checks on your progress. This drug may make you feel generally unwell. This is not uncommon, as chemotherapy can affect healthy cells as well as cancer cells. Report any side effects. Continue your course of treatment even though you feel ill unless your doctor tells you to stop. In some cases, you may be given additional medicines to help with side effects. Follow all directions for their use. Call your doctor or health care professional for advice if you get a fever, chills or sore throat, or other symptoms of a cold or flu. Do not treat yourself. This drug decreases your body's ability to fight infections. Try to avoid being around people who are sick. This medicine may increase your risk to bruise or bleed. Call your doctor or health care professional if you notice any unusual bleeding. Be careful brushing and flossing your teeth or using a toothpick because you may get an infection or bleed more  easily. If you have any dental work done, tell your dentist you are receiving this medicine. Avoid taking products that contain aspirin, acetaminophen, ibuprofen, naproxen, or ketoprofen unless instructed by your doctor. These medicines may hide a fever. Call your doctor or health care professional if you get diarrhea or mouth sores. Do not treat yourself. To protect your kidneys, drink water or other fluids as directed while you are taking this medicine. Do not become pregnant while taking this medicine or for 6 months after stopping it. Women should inform their doctor if they wish to become pregnant or think they might be pregnant. Men should not father a child while taking this medicine and for 3 months after stopping it. This may interfere with the ability to father a child. You should talk to your doctor or health care professional if you are concerned about your fertility. There is a potential for serious side effects to an unborn child. Talk to your health care professional or pharmacist for more information. Do not breast-feed an infant  while taking this medicine or for 1 week after stopping it. What side effects may I notice from receiving this medicine? Side effects that you should report to your doctor or health care professional as soon as possible:  allergic reactions like skin rash, itching or hives, swelling of the face, lips, or tongue  breathing problems  redness, blistering, peeling or loosening of the skin, including inside the mouth  signs and symptoms of bleeding such as bloody or black, tarry stools; red or dark-brown urine; spitting up blood or brown material that looks like coffee grounds; red spots on the skin; unusual bruising or bleeding from the eye, gums, or nose  signs and symptoms of infection like fever or chills; cough; sore throat; pain or trouble passing urine  signs and symptoms of kidney injury like trouble passing urine or change in the amount of  urine  signs and symptoms of liver injury like dark yellow or brown urine; general ill feeling or flu-like symptoms; light-colored stools; loss of appetite; nausea; right upper belly pain; unusually weak or tired; yellowing of the eyes or skin Side effects that usually do not require medical attention (report to your doctor or health care professional if they continue or are bothersome):  constipation  mouth sores  nausea, vomiting  unusually weak or tired This list may not describe all possible side effects. Call your doctor for medical advice about side effects. You may report side effects to FDA at 1-800-FDA-1088. Where should I keep my medicine? This drug is given in a hospital or clinic and will not be stored at home. NOTE: This sheet is a summary. It may not cover all possible information. If you have questions about this medicine, talk to your doctor, pharmacist, or health care provider.  2020 Elsevier/Gold Standard (2018-02-03 16:11:33)   Pembrolizumab injection What is this medicine? PEMBROLIZUMAB (pem broe liz ue mab) is a monoclonal antibody. It is used to treat bladder cancer, cervical cancer, endometrial cancer, esophageal cancer, head and neck cancer, hepatocellular cancer, Hodgkin lymphoma, kidney cancer, lymphoma, melanoma, Merkel cell carcinoma, lung cancer, stomach cancer, urothelial cancer, and cancers that have a certain genetic condition. This medicine may be used for other purposes; ask your health care provider or pharmacist if you have questions. COMMON BRAND NAME(S): Keytruda What should I tell my health care provider before I take this medicine? They need to know if you have any of these conditions:  diabetes  immune system problems  inflammatory bowel disease  liver disease  lung or breathing disease  lupus  received or scheduled to receive an organ transplant or a stem-cell transplant that uses donor stem cells  an unusual or allergic reaction to  pembrolizumab, other medicines, foods, dyes, or preservatives  pregnant or trying to get pregnant  breast-feeding How should I use this medicine? This medicine is for infusion into a vein. It is given by a health care professional in a hospital or clinic setting. A special MedGuide will be given to you before each treatment. Be sure to read this information carefully each time. Talk to your pediatrician regarding the use of this medicine in children. While this drug may be prescribed for selected conditions, precautions do apply. Overdosage: If you think you have taken too much of this medicine contact a poison control center or emergency room at once. NOTE: This medicine is only for you. Do not share this medicine with others. What if I miss a dose? It is important not to miss your dose.  Call your doctor or health care professional if you are unable to keep an appointment. What may interact with this medicine? Interactions have not been studied. Give your health care provider a list of all the medicines, herbs, non-prescription drugs, or dietary supplements you use. Also tell them if you smoke, drink alcohol, or use illegal drugs. Some items may interact with your medicine. This list may not describe all possible interactions. Give your health care provider a list of all the medicines, herbs, non-prescription drugs, or dietary supplements you use. Also tell them if you smoke, drink alcohol, or use illegal drugs. Some items may interact with your medicine. What should I watch for while using this medicine? Your condition will be monitored carefully while you are receiving this medicine. You may need blood work done while you are taking this medicine. Do not become pregnant while taking this medicine or for 4 months after stopping it. Women should inform their doctor if they wish to become pregnant or think they might be pregnant. There is a potential for serious side effects to an unborn child.  Talk to your health care professional or pharmacist for more information. Do not breast-feed an infant while taking this medicine or for 4 months after the last dose. What side effects may I notice from receiving this medicine? Side effects that you should report to your doctor or health care professional as soon as possible:  allergic reactions like skin rash, itching or hives, swelling of the face, lips, or tongue  bloody or black, tarry  breathing problems  changes in vision  chest pain  chills  confusion  constipation  cough  diarrhea  dizziness or feeling faint or lightheaded  fast or irregular heartbeat  fever  flushing  hair loss  joint pain  low blood counts - this medicine may decrease the number of white blood cells, red blood cells and platelets. You may be at increased risk for infections and bleeding.  muscle pain  muscle weakness  persistent headache  redness, blistering, peeling or loosening of the skin, including inside the mouth  signs and symptoms of high blood sugar such as dizziness; dry mouth; dry skin; fruity breath; nausea; stomach pain; increased hunger or thirst; increased urination  signs and symptoms of kidney injury like trouble passing urine or change in the amount of urine  signs and symptoms of liver injury like dark urine, light-colored stools, loss of appetite, nausea, right upper belly pain, yellowing of the eyes or skin  sweating  swollen lymph nodes  weight loss Side effects that usually do not require medical attention (report to your doctor or health care professional if they continue or are bothersome):  decreased appetite  muscle pain  tiredness This list may not describe all possible side effects. Call your doctor for medical advice about side effects. You may report side effects to FDA at 1-800-FDA-1088. Where should I keep my medicine? This drug is given in a hospital or clinic and will not be stored at  home. NOTE: This sheet is a summary. It may not cover all possible information. If you have questions about this medicine, talk to your doctor, pharmacist, or health care provider.  2020 Elsevier/Gold Standard (2019-01-11 13:46:58)

## 2019-06-29 NOTE — Progress Notes (Signed)
Patient was hard stick today. To receive TSH with next cycle.  Larene Beach, PharmD

## 2019-06-29 NOTE — Telephone Encounter (Signed)
Blood transfusion called to cancel lab appointment due to patient getting blood drawn earlier during CT scan.

## 2019-06-29 NOTE — Progress Notes (Signed)
Notified by Barbara Watkins in CT scan that patient was crying out in pain and not able to recline in machine for her scan. Took last hydromorphone at 0900 and zofran 4 mg po at 1100. Asking for IV pain med for procedure.  Per Dr. Benay Spice: Give hydromorphone 2 mg IV x 1. Order placed under sign and hold and Sherri notified.

## 2019-06-30 ENCOUNTER — Telehealth: Payer: Self-pay | Admitting: *Deleted

## 2019-06-30 ENCOUNTER — Other Ambulatory Visit: Payer: Self-pay | Admitting: *Deleted

## 2019-06-30 MED ORDER — PROCHLORPERAZINE MALEATE 5 MG PO TABS: 5.0000 mg | ORAL_TABLET | Freq: Four times a day (QID) | ORAL | 1 refills | Status: DC | PRN

## 2019-06-30 NOTE — Telephone Encounter (Addendum)
Patient called answering service last night to confirm she is not able to take her zofran for 3 days and if it is OK to take her pain pills? Called back and left VM requesting she return call to discuss above and her scan results. Returned call: discussed reason for not taking Zofran for 72 hours. Will call in low dose of compazine for prn use and explained she can take this at any time. Provided CT results of brain-negative. She denies any other questions or adverse effect from her chemo tx yesterday.

## 2019-07-05 ENCOUNTER — Encounter (HOSPITAL_COMMUNITY): Payer: Self-pay | Admitting: Oncology

## 2019-07-13 ENCOUNTER — Encounter: Payer: Self-pay | Admitting: *Deleted

## 2019-07-13 NOTE — Progress Notes (Signed)
[facility] Clinical Social Work  [facility] representative spoke with patient and complete/submitted servant center referral.  [place] representative will reach out to patient and begin SSD application process.  Johnnye Lana, MSW, LCSW, OSW-C Clinical Social Worker [facility] [phone]

## 2019-07-13 NOTE — Progress Notes (Signed)
[facility] Work  Clinical Social Work received call from patients niece requesting support for patient and assistance applying for Medicaid and disability.  CSW contacted inpatient financial advocate to determine if patient applied for Medicaid while inpatient.  Awaiting return call.  CSW provided education on the servant center and process for applying for disability.  CSW will need to speak with patient before referral to servant center can be completed.  Patients niece stated she would share information with patient and have patient contact CSW.  CSW will also follow up with patient if no return call.    Johnnye Lana, MSW, LCSW, OSW-C Clinical Social Worker [facility] [phone]

## 2019-07-16 ENCOUNTER — Other Ambulatory Visit: Payer: Self-pay | Admitting: Oncology

## 2019-07-20 ENCOUNTER — Encounter: Payer: Self-pay | Admitting: Nurse Practitioner

## 2019-07-20 ENCOUNTER — Other Ambulatory Visit: Payer: Self-pay

## 2019-07-20 ENCOUNTER — Inpatient Hospital Stay: Payer: Self-pay

## 2019-07-20 ENCOUNTER — Inpatient Hospital Stay (HOSPITAL_BASED_OUTPATIENT_CLINIC_OR_DEPARTMENT_OTHER): Payer: Self-pay | Admitting: Nurse Practitioner

## 2019-07-20 VITALS — HR 94

## 2019-07-20 VITALS — BP 134/79 | HR 102 | Temp 98.5°F | Resp 18 | Ht 64.0 in | Wt 111.7 lb

## 2019-07-20 DIAGNOSIS — C3492 Malignant neoplasm of unspecified part of left bronchus or lung: Secondary | ICD-10-CM

## 2019-07-20 DIAGNOSIS — C3412 Malignant neoplasm of upper lobe, left bronchus or lung: Secondary | ICD-10-CM

## 2019-07-20 DIAGNOSIS — R21 Rash and other nonspecific skin eruption: Secondary | ICD-10-CM

## 2019-07-20 DIAGNOSIS — Z112 Encounter for screening for other bacterial diseases: Secondary | ICD-10-CM

## 2019-07-20 DIAGNOSIS — C799 Secondary malignant neoplasm of unspecified site: Secondary | ICD-10-CM

## 2019-07-20 DIAGNOSIS — K5900 Constipation, unspecified: Secondary | ICD-10-CM

## 2019-07-20 DIAGNOSIS — D649 Anemia, unspecified: Secondary | ICD-10-CM

## 2019-07-20 LAB — CMP (CANCER CENTER ONLY)
ALT: 14 U/L (ref 0–44)
AST: 14 U/L — ABNORMAL LOW (ref 15–41)
Albumin: 2.9 g/dL — ABNORMAL LOW (ref 3.5–5.0)
Alkaline Phosphatase: 204 U/L — ABNORMAL HIGH (ref 38–126)
Anion gap: 10 (ref 5–15)
BUN: 11 mg/dL (ref 6–20)
CO2: 24 mmol/L (ref 22–32)
Calcium: 10 mg/dL (ref 8.9–10.3)
Chloride: 102 mmol/L (ref 98–111)
Creatinine: 0.85 mg/dL (ref 0.44–1.00)
GFR, Est AFR Am: >60 mL/min (ref >60)
GFR, Estimated: >60 mL/min (ref >60)
Glucose, Bld: 132 mg/dL — ABNORMAL HIGH (ref 70–99)
Potassium: 4.2 mmol/L (ref 3.5–5.1)
Sodium: 136 mmol/L (ref 135–145)
Total Bilirubin: 0.3 mg/dL (ref 0.3–1.2)
Total Protein: 6.9 g/dL (ref 6.5–8.1)

## 2019-07-20 LAB — CBC WITH DIFFERENTIAL (CANCER CENTER ONLY)
Abs Immature Granulocytes: 0.03 10*3/uL (ref 0.00–0.07)
Basophils Absolute: 0.1 10*3/uL (ref 0.0–0.1)
Basophils Relative: 1 %
Eosinophils Absolute: 0.4 10*3/uL (ref 0.0–0.5)
Eosinophils Relative: 7 %
HCT: 31.5 % — ABNORMAL LOW (ref 36.0–46.0)
Hemoglobin: 9.6 g/dL — ABNORMAL LOW (ref 12.0–15.0)
Immature Granulocytes: 1 %
Lymphocytes Relative: 32 %
Lymphs Abs: 1.7 10*3/uL (ref 0.7–4.0)
MCH: 24.4 pg — ABNORMAL LOW (ref 26.0–34.0)
MCHC: 30.5 g/dL (ref 30.0–36.0)
MCV: 80.2 fL (ref 80.0–100.0)
Monocytes Absolute: 0.7 10*3/uL (ref 0.1–1.0)
Monocytes Relative: 13 %
Neutro Abs: 2.6 10*3/uL (ref 1.7–7.7)
Neutrophils Relative %: 46 %
Platelet Count: 541 10*3/uL — ABNORMAL HIGH (ref 150–400)
RBC: 3.93 MIL/uL (ref 3.87–5.11)
RDW: 17.9 % — ABNORMAL HIGH (ref 11.5–15.5)
WBC Count: 5.5 10*3/uL (ref 4.0–10.5)
nRBC: 0 % (ref 0.0–0.2)

## 2019-07-20 LAB — TSH: TSH: 4.225 u[IU]/mL — ABNORMAL HIGH (ref 0.308–3.960)

## 2019-07-20 MED ORDER — SODIUM CHLORIDE 0.9 % IV SOLN
Freq: Once | INTRAVENOUS | Status: AC
  Administered 2019-07-20: 15:00:00 via INTRAVENOUS
  Filled 2019-07-20: qty 5

## 2019-07-20 MED ORDER — SODIUM CHLORIDE 0.9 % IV SOLN
800.0000 mg | Freq: Once | INTRAVENOUS | Status: AC
  Administered 2019-07-20: 800 mg via INTRAVENOUS
  Filled 2019-07-20: qty 20

## 2019-07-20 MED ORDER — FOLIC ACID 1 MG PO TABS: 1.0000 mg | ORAL_TABLET | Freq: Every day | ORAL | 6 refills | Status: DC

## 2019-07-20 MED ORDER — TRIAMCINOLONE ACETONIDE 0.1 % EX OINT: 1.0000 "application " | TOPICAL_OINTMENT | Freq: Two times a day (BID) | CUTANEOUS | 0 refills | Status: DC

## 2019-07-20 MED ORDER — SODIUM CHLORIDE 0.9 % IV SOLN
430.0000 mg | Freq: Once | INTRAVENOUS | Status: AC
  Administered 2019-07-20: 16:00:00 430 mg via INTRAVENOUS
  Filled 2019-07-20: qty 43

## 2019-07-20 MED ORDER — PALONOSETRON HCL INJECTION 0.25 MG/5ML
INTRAVENOUS | Status: AC
  Filled 2019-07-20: qty 5

## 2019-07-20 MED ORDER — PALONOSETRON HCL INJECTION 0.25 MG/5ML
0.2500 mg | Freq: Once | INTRAVENOUS | Status: AC
  Administered 2019-07-20: 0.25 mg via INTRAVENOUS

## 2019-07-20 MED ORDER — SODIUM CHLORIDE 0.9 % IV SOLN
Freq: Once | INTRAVENOUS | Status: AC
  Administered 2019-07-20: 14:00:00 via INTRAVENOUS
  Filled 2019-07-20: qty 250

## 2019-07-20 MED ORDER — ONDANSETRON HCL 4 MG PO TABS: 4.0000 mg | ORAL_TABLET | Freq: Three times a day (TID) | ORAL | 1 refills | Status: DC | PRN

## 2019-07-20 MED ORDER — SODIUM CHLORIDE 0.9 % IV SOLN
200.0000 mg | Freq: Once | INTRAVENOUS | Status: AC
  Administered 2019-07-20: 200 mg via INTRAVENOUS
  Filled 2019-07-20: qty 8

## 2019-07-20 MED FILL — ONDANSETRON HCL 4 MG TABLET: 4 | 6 days supply | Qty: 20 | Fill #0

## 2019-07-20 MED FILL — TRIAMCINOLONE 0.1% OINTMENT: 0.1 | 10 days supply | Qty: 30 | Fill #0

## 2019-07-20 MED FILL — FOLIC ACID 1 MG TABS: 1 | 30 days supply | Qty: 30 | Fill #0

## 2019-07-20 NOTE — Patient Instructions (Signed)
[facility] Discharge Instructions for Patients Receiving Chemotherapy  Today you received the following chemotherapy agents: Pembrolizumab (Keytruda), Pemetrexed (Alimta), and Carboplatin (Paraplatin)  To help prevent nausea and vomiting after your treatment, we encourage you to take your nausea medication as directed.   If you develop nausea and vomiting that is not controlled by your nausea medication, call the clinic.   BELOW ARE SYMPTOMS THAT SHOULD BE REPORTED IMMEDIATELY:  *FEVER GREATER THAN 100.5 F  *CHILLS WITH OR WITHOUT FEVER  NAUSEA AND VOMITING THAT IS NOT CONTROLLED WITH YOUR NAUSEA MEDICATION  *UNUSUAL SHORTNESS OF BREATH  *UNUSUAL BRUISING OR BLEEDING  TENDERNESS IN MOUTH AND THROAT WITH OR WITHOUT PRESENCE OF ULCERS  *URINARY PROBLEMS  *BOWEL PROBLEMS  UNUSUAL RASH Items with * indicate a potential emergency and should be followed up as soon as possible.  Feel free to call the clinic should you have any questions or concerns. The clinic phone number is [phone].  Please show the [facility] at check-in to the Emergency Department and triage nurse.  Coronavirus (COVID-19) Are you at risk?  Are you at risk for the Coronavirus (COVID-19)?  To be considered HIGH RISK for Coronavirus (COVID-19), you have to meet the following criteria:  . Traveled to [place], [place], [place], [place] or [place]; or in the [place] to [place], [place], [place], or [place]; and have fever, cough, and shortness of breath within the last 2 weeks of travel OR . Been in close contact with a person diagnosed with COVID-19 within the last 2 weeks and have fever, cough, and shortness of breath . IF YOU DO NOT MEET THESE CRITERIA, YOU ARE CONSIDERED LOW RISK FOR COVID-19.  What to do if you are HIGH RISK for COVID-19?  Marland Kitchen If you are having a medical emergency, call 911. . Seek medical care right away. Before you go to a doctor's office, urgent care  or emergency department, call ahead and tell them about your recent travel, contact with someone diagnosed with COVID-19, and your symptoms. You should receive instructions from your physician's office regarding next steps of care.  . When you arrive at healthcare provider, tell the healthcare staff immediately you have returned from visiting [place], [place], [place], [place] or [place]; or traveled in the [place] to [place], [place], [place], or [place]; in the last two weeks or you have been in close contact with a person diagnosed with COVID-19 in the last 2 weeks.   . Tell the health care staff about your symptoms: fever, cough and shortness of breath. . After you have been seen by a medical provider, you will be either: o Tested for (COVID-19) and discharged home on quarantine except to seek medical care if symptoms worsen, and asked to  - Stay home and avoid contact with others until you get your results (4-5 days)  - Avoid travel on public transportation if possible (such as bus, train, or airplane) or o Sent to the Emergency Department by EMS for evaluation, COVID-19 testing, and possible admission depending on your condition and test results.  What to do if you are LOW RISK for COVID-19?  Reduce your risk of any infection by using the same precautions used for avoiding the common cold or flu:  Marland Kitchen Wash your hands often with soap and warm water for at least 20 seconds.  If soap and water are not readily available, use an alcohol-based hand sanitizer with at least 60% alcohol.  Marland Kitchen  If coughing or sneezing, cover your mouth and nose by coughing or sneezing into the elbow areas of your shirt or coat, into a tissue or into your sleeve (not your hands). . Avoid shaking hands with others and consider head nods or verbal greetings only. . Avoid touching your eyes, nose, or mouth with unwashed hands.  . Avoid close contact with people who are sick. . Avoid places or events with large  numbers of people in one location, like concerts or sporting events. . Carefully consider travel plans you have or are making. . If you are planning any travel outside or inside the [place], visit the CDC's Travelers' Health webpage for the latest health notices. . If you have some symptoms but not all symptoms, continue to monitor at home and seek medical attention if your symptoms worsen. . If you are having a medical emergency, call 911.   [place] / e-Visit: [email or web address]         [facility]: [facility]: [phone]                   [facility]: [phone]

## 2019-07-20 NOTE — Progress Notes (Signed)
Met with patient at registration to introduce myself as Financial Resource Specialist and to offer available resources. ° °Discussed the one-time $700 [facility] grant and qualifications to assist with personal expenses while going through treatment. ° °Based on need and verbal income, approved patient for grant to get medications. It is active today.   ° °Gave her a copy of the approval letter and expense sheet along with the Outpatient pharmacy information. She verbalized understanding.  ° °She has my card for any additional financial questions or concerns. °

## 2019-07-20 NOTE — Progress Notes (Addendum)
[facility] OFFICE PROGRESS NOTE   Diagnosis: Non-small cell lung cancer  INTERVAL HISTORY:   Barbara Watkins returns as scheduled.  She completed cycle 1 carboplatin/Alimta/pembrolizumab 06/29/2019.  She had very mild nausea and vomited on one occasion.  She thinks the vomiting was related to something she ate rather than the chemotherapy.  No mouth sores.  No diarrhea.  About a week ago she noted a rash over her forearms.  The rash was initially pruritic.  The pruritus improved with topical cortisone.  Right side pain is better.  She denies shortness of breath.  She is more active.  She denies bleeding.  Objective:  Vital signs in last 24 hours:  Blood pressure 134/79, pulse (!) 102, temperature 98.5 F (36.9 C), temperature source Oral, resp. rate 18, height 5' 4"  (1.626 m), weight 111 lb 11.2 oz (50.7 kg), SpO2 98 %.    HEENT: No thrush or ulcers.  Resolving small lesion at the right upper lip. GI: Abdomen soft and nontender.  No hepatomegaly. Vascular: No leg edema.  Calves soft and nontender. Neuro: Alert and oriented. Skin: Erythematous maculopapular rash mainly over the forearms.  Skin in general has a dry appearance.   Lab Results:  Lab Results  Component Value Date   WBC 5.5 07/20/2019   HGB 9.6 (L) 07/20/2019   HCT 31.5 (L) 07/20/2019   MCV 80.2 07/20/2019   PLT 541 (H) 07/20/2019   NEUTROABS 2.6 07/20/2019    Imaging:  No results found.  Medications: I have reviewed the patient's current medications.  Assessment/Plan: 1.  Metastatic non-small cell lung cancer   CT abdomen/pelvis 06/12/2019-scattered soft tissue masses in left hepatic lobe, right kidney, and the right adrenal gland. Fullness of the left hilum suspicious for left hilar mass and small nodules at the right lung base. Also noted signs of right pyelonephritis.   CT chest 06/13/2019- large encasing soft tissue mass left hilum measuring at least 5.8 x 4.5 x 3.8 cm with narrowing of the  left mainstem bronchus and left pulmonary artery.  Near-total obstructive atelectasis of the left upper lobe with a portion of the lingula remaining aerated.  Multiple bilateral pulmonary nodules.  Biopsy left supraclavicular lymph node 06/15/2019- metastatic lung adenocarcinoma; immunohistochemistry positive for cytokeratin 7, TTF-1, PAX 8 (weak).  Napsin-A and CDX-2 negative.  Foundation 1- microsatellite stable, tumor mutational burden 15, ATM alteration, KRAS G13D;  tumor proportion score 100%  Brain CT 06/29/2019- negative for metastatic disease  Cycle 1 carboplatin/Alimta/pembrolizumab 06/29/2019  Cycle 2 carboplatin/Alimta/pembrolizumab 07/20/2019 2. Anemia 3.Urinary tract infection 4. 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 Nausea 6. Constipation 7.  Right abdominal pain secondary to #1 8.  Skin rash bilateral forearms, grade 2, likely related to pembrolizumab  Disposition: Barbara Watkins appears stable.  She has completed 1 cycle of carboplatin/Alimta/pembrolizumab.  Plan to proceed with cycle 2 today as scheduled.  She has a grade 2 skin rash mainly affecting the forearms, symptomatic with pruritus.  A prescription was sent to her pharmacy for Kenalog ointment 0.1 with instructions to apply twice daily to the affected areas.  She will return for a follow-up appointment in about 10 days to reevaluate the rash.  Patient seen with Dr. Benay Spice.  25 minutes were spent face-to-face at today's visit with the majority of that time involved in counseling/coordination of care.  Ned Card ANP/GNP-BC   07/20/2019  1:09 PM   This was a shared visit with Ned Card.  Barbara Watkins was interviewed and  examined.  Her performance status has improved following cycle 1 of systemic therapy.  She has developed a rash, chiefly over the forearms.  She will use a topical steroid.  The plan is to proceed with cycle 2 carboplatin/Alimta/pembrolizumab today.  [name], MD

## 2019-07-21 ENCOUNTER — Telehealth: Payer: Self-pay | Admitting: Nurse Practitioner

## 2019-07-21 NOTE — Telephone Encounter (Signed)
Called and spoke with ptient. Confirmed date and time

## 2019-07-28 ENCOUNTER — Telehealth: Payer: Self-pay

## 2019-07-28 ENCOUNTER — Inpatient Hospital Stay: Payer: Self-pay | Admitting: Nurse Practitioner

## 2019-07-28 NOTE — Telephone Encounter (Signed)
Called pt d/t missed appt today with Ned Card, NP. LVM instructing pt to call back.

## 2019-07-29 ENCOUNTER — Telehealth: Payer: Self-pay | Admitting: *Deleted

## 2019-07-29 NOTE — Telephone Encounter (Signed)
Called requesting to reschedule her missed appointment yesterday--had no transportation. Still has rash. [facility] scheduling message sent.

## 2019-08-03 ENCOUNTER — Encounter: Payer: Self-pay | Admitting: Nurse Practitioner

## 2019-08-03 ENCOUNTER — Other Ambulatory Visit: Payer: Self-pay

## 2019-08-03 ENCOUNTER — Inpatient Hospital Stay: Payer: Self-pay | Attending: Nurse Practitioner | Admitting: Nurse Practitioner

## 2019-08-03 VITALS — BP 123/83 | HR 111 | Temp 99.1°F | Resp 14 | Ht 64.0 in | Wt 109.0 lb

## 2019-08-03 DIAGNOSIS — C799 Secondary malignant neoplasm of unspecified site: Secondary | ICD-10-CM

## 2019-08-03 DIAGNOSIS — R21 Rash and other nonspecific skin eruption: Secondary | ICD-10-CM | POA: Insufficient documentation

## 2019-08-03 DIAGNOSIS — D649 Anemia, unspecified: Secondary | ICD-10-CM | POA: Insufficient documentation

## 2019-08-03 DIAGNOSIS — C3412 Malignant neoplasm of upper lobe, left bronchus or lung: Secondary | ICD-10-CM | POA: Insufficient documentation

## 2019-08-03 DIAGNOSIS — Z5111 Encounter for antineoplastic chemotherapy: Secondary | ICD-10-CM | POA: Insufficient documentation

## 2019-08-03 DIAGNOSIS — R110 Nausea: Secondary | ICD-10-CM | POA: Insufficient documentation

## 2019-08-03 DIAGNOSIS — N390 Urinary tract infection, site not specified: Secondary | ICD-10-CM | POA: Insufficient documentation

## 2019-08-03 DIAGNOSIS — C770 Secondary and unspecified malignant neoplasm of lymph nodes of head, face and neck: Secondary | ICD-10-CM | POA: Insufficient documentation

## 2019-08-03 DIAGNOSIS — K5900 Constipation, unspecified: Secondary | ICD-10-CM | POA: Insufficient documentation

## 2019-08-03 DIAGNOSIS — Z79899 Other long term (current) drug therapy: Secondary | ICD-10-CM | POA: Insufficient documentation

## 2019-08-03 MED ORDER — HYDROMORPHONE HCL 2 MG PO TABS: 2.0000 mg | ORAL_TABLET | Freq: Three times a day (TID) | ORAL | 0 refills | Status: DC | PRN

## 2019-08-03 MED ORDER — PREDNISONE 20 MG PO TABS: 30.0000 mg | ORAL_TABLET | Freq: Every day | ORAL | 0 refills | Status: DC

## 2019-08-03 MED FILL — HYDROmorphone HCL 2 MG TABS: 2 | 13 days supply | Qty: 40 | Fill #0

## 2019-08-03 MED FILL — predniSONE 20 MG TABS: 20 | 20 days supply | Qty: 30 | Fill #0

## 2019-08-03 NOTE — Progress Notes (Addendum)
[facility] OFFICE PROGRESS NOTE   Diagnosis: Non-small cell lung cancer  INTERVAL HISTORY:   Barbara Watkins returns for follow-up.  She completed cycle 2 carboplatin/Alimta/pembrolizumab 07/20/2019.  She was scheduled to return for a follow-up appointment about 10 days after cycle 2 to reevaluate a skin rash.  She is unable to keep that appointment due to transportation issues.  She is seen today to reevaluate the rash.  She feels the rash has increased.  The rash remains very pruritic.  She noted no improvement with the triamcinolone cream twice a day.  She otherwise feels well.  No nausea or vomiting.  No mouth sores.  No diarrhea.  She has mild dyspnea on exertion when she climbs stairs.  She has intermittent back pain.  Objective:  Vital signs in last 24 hours:  Blood pressure 123/83, pulse (!) 111, temperature 99.1 F (37.3 C), temperature source Temporal, resp. rate 14, height _0  (1.626 m), weight 109 lb (49.4 kg), SpO2 97 %.    HEENT: No thrush.  No ulcers.  Inner lips mildly erythematous. GI: Abdomen soft and nontender. Vascular: No leg edema. Neuro: Alert and oriented. Skin: Erythematous pruritic rash scattered over the upper extremities and trunk.  The rash over the forearms is raised in areas.  The rash over the trunk is flat.  Palms without erythema.   Lab Results:  Lab Results  Component Value Date   WBC 5.5 07/20/2019   HGB 9.6 (L) 07/20/2019   HCT 31.5 (L) 07/20/2019   MCV 80.2 07/20/2019   PLT 541 (H) 07/20/2019   NEUTROABS 2.6 07/20/2019    Imaging:  No results found.  Medications: I have reviewed the patient's current medications.  Assessment/Plan: 1.Metastatic non-small cell lung cancer   CTabdomen/pelvis 06/12/2019-scattered soft tissue masses in left hepatic lobe, right kidney, and the right adrenal gland. Fullness of the left hilum suspicious for left hilar mass and small nodules at the right lung base. Also noted signs of  right pyelonephritis.  CT chest 06/13/2019-large encasing soft tissue mass left hilum measuring at least 5.8 x 4.5 x 3.8 cm with narrowing of the left mainstem bronchus and left pulmonary artery. Near-total obstructive atelectasis of the left upper lobe with a portion of the lingula remaining aerated. Multiple bilateral pulmonary nodules.  Biopsy left supraclavicular lymph node 06/15/2019-metastatic lung adenocarcinoma; immunohistochemistry positive for cytokeratin 7, TTF-1, PAX 8 (weak). Napsin-A and CDX-2negative.  Foundation 1- microsatellite stable, tumor mutational burden 15, ATM alteration, KRAS G13D;  tumor proportion score 100%  Brain CT 06/29/2019- negative for metastatic disease  Cycle 1 carboplatin/Alimta/pembrolizumab 06/29/2019  Cycle 2 carboplatin/Alimta/pembrolizumab 07/20/2019 2. Anemia 3.Urinary tract infection 4. 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 Nausea 6. Constipation 7.Right abdominal pain secondary to #1 8.  Skin rash bilateral forearms, grade 2, likely related to pembrolizumab 07/20/2019; grade 3 08/03/2019, prednisone initiated  Disposition: Barbara Watkins completed cycle 2 carboplatin/Alimta/pembrolizumab 07/20/2019.  The skin rash has increased and remains extremely pruritic, now grade 3.  She understands the rash is likely related to pembrolizumab.  She will begin prednisone 30 mg daily.  She can continue the topical steroid as well.  We will reevaluate the rash when she returns in 1 week for cycle 3.  She will contact the office prior to that appointment if the rash worsens.  Patient seen with Dr. Benay Spice.  25 minutes were spent face-to-face at today's visit with the majority of that time involved in counseling/coordination of care.  Ned Card ANP/GNP-BC  08/03/2019  2:14 PM  This was a shared visit with Ned Card.  Barbara Watkins was interviewed and examined.  She has a progressive rash, likely related to pembrolizumab.  She will complete a  course of prednisone.  We will likely proceed with Alimta/carboplatin and no pembrolizumab when she returns for the next cycle of chemotherapy in 1 week.  [name], MD

## 2019-08-06 ENCOUNTER — Other Ambulatory Visit: Payer: Self-pay | Admitting: Oncology

## 2019-08-10 ENCOUNTER — Encounter: Payer: Self-pay | Admitting: Nurse Practitioner

## 2019-08-10 ENCOUNTER — Other Ambulatory Visit: Payer: Self-pay

## 2019-08-10 ENCOUNTER — Inpatient Hospital Stay: Payer: Self-pay

## 2019-08-10 ENCOUNTER — Inpatient Hospital Stay (HOSPITAL_BASED_OUTPATIENT_CLINIC_OR_DEPARTMENT_OTHER): Payer: Self-pay | Admitting: Nurse Practitioner

## 2019-08-10 VITALS — BP 129/83 | HR 88 | Temp 98.7°F | Resp 16 | Ht 64.0 in | Wt 108.5 lb

## 2019-08-10 DIAGNOSIS — C799 Secondary malignant neoplasm of unspecified site: Secondary | ICD-10-CM

## 2019-08-10 DIAGNOSIS — C3492 Malignant neoplasm of unspecified part of left bronchus or lung: Secondary | ICD-10-CM

## 2019-08-10 LAB — CBC WITH DIFFERENTIAL (CANCER CENTER ONLY)
Abs Immature Granulocytes: 0.08 10*3/uL — ABNORMAL HIGH (ref 0.00–0.07)
Basophils Absolute: 0.1 10*3/uL (ref 0.0–0.1)
Basophils Relative: 1 %
Eosinophils Absolute: 0.1 10*3/uL (ref 0.0–0.5)
Eosinophils Relative: 2 %
HCT: 31.2 % — ABNORMAL LOW (ref 36.0–46.0)
Hemoglobin: 9.8 g/dL — ABNORMAL LOW (ref 12.0–15.0)
Immature Granulocytes: 1 %
Lymphocytes Relative: 33 %
Lymphs Abs: 2 10*3/uL (ref 0.7–4.0)
MCH: 25.5 pg — ABNORMAL LOW (ref 26.0–34.0)
MCHC: 31.4 g/dL (ref 30.0–36.0)
MCV: 81 fL (ref 80.0–100.0)
Monocytes Absolute: 0.8 10*3/uL (ref 0.1–1.0)
Monocytes Relative: 13 %
Neutro Abs: 3.1 10*3/uL (ref 1.7–7.7)
Neutrophils Relative %: 50 %
Platelet Count: 426 10*3/uL — ABNORMAL HIGH (ref 150–400)
RBC: 3.85 MIL/uL — ABNORMAL LOW (ref 3.87–5.11)
RDW: 23.9 % — ABNORMAL HIGH (ref 11.5–15.5)
WBC Count: 6.1 10*3/uL (ref 4.0–10.5)
nRBC: 0 % (ref 0.0–0.2)

## 2019-08-10 LAB — CMP (CANCER CENTER ONLY)
ALT: 23 U/L (ref 0–44)
AST: 15 U/L (ref 15–41)
Albumin: 3.4 g/dL — ABNORMAL LOW (ref 3.5–5.0)
Alkaline Phosphatase: 144 U/L — ABNORMAL HIGH (ref 38–126)
Anion gap: 11 (ref 5–15)
BUN: 12 mg/dL (ref 6–20)
CO2: 23 mmol/L (ref 22–32)
Calcium: 10.4 mg/dL — ABNORMAL HIGH (ref 8.9–10.3)
Chloride: 102 mmol/L (ref 98–111)
Creatinine: 0.91 mg/dL (ref 0.44–1.00)
GFR, Est AFR Am: >60 mL/min (ref >60)
GFR, Estimated: >60 mL/min (ref >60)
Glucose, Bld: 161 mg/dL — ABNORMAL HIGH (ref 70–99)
Potassium: 4.1 mmol/L (ref 3.5–5.1)
Sodium: 136 mmol/L (ref 135–145)
Total Bilirubin: 0.2 mg/dL — ABNORMAL LOW (ref 0.3–1.2)
Total Protein: 7 g/dL (ref 6.5–8.1)

## 2019-08-10 LAB — TSH: TSH: 3.334 u[IU]/mL (ref 0.308–3.960)

## 2019-08-10 MED ORDER — SODIUM CHLORIDE 0.9 % IV SOLN
430.0000 mg | Freq: Once | INTRAVENOUS | Status: AC
  Administered 2019-08-10: 15:00:00 430 mg via INTRAVENOUS
  Filled 2019-08-10: qty 43

## 2019-08-10 MED ORDER — CYANOCOBALAMIN 1000 MCG/ML IJ SOLN
INTRAMUSCULAR | Status: AC
  Filled 2019-08-10: qty 1

## 2019-08-10 MED ORDER — SODIUM CHLORIDE 0.9 % IV SOLN
Freq: Once | INTRAVENOUS | Status: AC
  Administered 2019-08-10: 14:00:00 via INTRAVENOUS
  Filled 2019-08-10: qty 5

## 2019-08-10 MED ORDER — SODIUM CHLORIDE 0.9 % IV SOLN
Freq: Once | INTRAVENOUS | Status: AC
  Administered 2019-08-10: 14:00:00 via INTRAVENOUS
  Filled 2019-08-10: qty 250

## 2019-08-10 MED ORDER — CYANOCOBALAMIN 1000 MCG/ML IJ SOLN
1000.0000 ug | Freq: Once | INTRAMUSCULAR | Status: AC
  Administered 2019-08-10: 1000 ug via INTRAMUSCULAR

## 2019-08-10 MED ORDER — PALONOSETRON HCL INJECTION 0.25 MG/5ML
0.2500 mg | Freq: Once | INTRAVENOUS | Status: AC
  Administered 2019-08-10: 14:00:00 0.25 mg via INTRAVENOUS

## 2019-08-10 MED ORDER — PALONOSETRON HCL INJECTION 0.25 MG/5ML
INTRAVENOUS | Status: AC
  Filled 2019-08-10: qty 5

## 2019-08-10 MED ORDER — SODIUM CHLORIDE 0.9 % IV SOLN
510.0000 mg/m2 | Freq: Once | INTRAVENOUS | Status: AC
  Administered 2019-08-10: 15:00:00 800 mg via INTRAVENOUS
  Filled 2019-08-10: qty 20

## 2019-08-10 NOTE — Patient Instructions (Signed)
Decrease prednisone to 10 mg daily for 7 days, then STOP

## 2019-08-10 NOTE — Patient Instructions (Signed)
[facility] Discharge Instructions for Patients Receiving Chemotherapy  Today you received the following chemotherapy agents: Pemetrexed (Alimta), and Carboplatin (Paraplatin)  To help prevent nausea and vomiting after your treatment, we encourage you to take your nausea medication as directed.   If you develop nausea and vomiting that is not controlled by your nausea medication, call the clinic.   BELOW ARE SYMPTOMS THAT SHOULD BE REPORTED IMMEDIATELY:  *FEVER GREATER THAN 100.5 F  *CHILLS WITH OR WITHOUT FEVER  NAUSEA AND VOMITING THAT IS NOT CONTROLLED WITH YOUR NAUSEA MEDICATION  *UNUSUAL SHORTNESS OF BREATH  *UNUSUAL BRUISING OR BLEEDING  TENDERNESS IN MOUTH AND THROAT WITH OR WITHOUT PRESENCE OF ULCERS  *URINARY PROBLEMS  *BOWEL PROBLEMS  UNUSUAL RASH Items with * indicate a potential emergency and should be followed up as soon as possible.  Feel free to call the clinic should you have any questions or concerns. The clinic phone number is [phone].  Please show the [facility] at check-in to the Emergency Department and triage nurse.  Coronavirus (COVID-19) Are you at risk?  Are you at risk for the Coronavirus (COVID-19)?  To be considered HIGH RISK for Coronavirus (COVID-19), you have to meet the following criteria:  . Traveled to [place], [place], [place], [place] or [place]; or in the [place] to [place], [place], [place], or [place]; and have fever, cough, and shortness of breath within the last 2 weeks of travel OR . Been in close contact with a person diagnosed with COVID-19 within the last 2 weeks and have fever, cough, and shortness of breath . IF YOU DO NOT MEET THESE CRITERIA, YOU ARE CONSIDERED LOW RISK FOR COVID-19.  What to do if you are HIGH RISK for COVID-19?  Marland Kitchen If you are having a medical emergency, call 911. . Seek medical care right away. Before you go to a doctor's office, urgent care or emergency department,  call ahead and tell them about your recent travel, contact with someone diagnosed with COVID-19, and your symptoms. You should receive instructions from your physician's office regarding next steps of care.  . When you arrive at healthcare provider, tell the healthcare staff immediately you have returned from visiting [place], [place], [place], [place] or [place]; or traveled in the [place] to [place], [place], [place], or [place]; in the last two weeks or you have been in close contact with a person diagnosed with COVID-19 in the last 2 weeks.   . Tell the health care staff about your symptoms: fever, cough and shortness of breath. . After you have been seen by a medical provider, you will be either: o Tested for (COVID-19) and discharged home on quarantine except to seek medical care if symptoms worsen, and asked to  - Stay home and avoid contact with others until you get your results (4-5 days)  - Avoid travel on public transportation if possible (such as bus, train, or airplane) or o Sent to the Emergency Department by EMS for evaluation, COVID-19 testing, and possible admission depending on your condition and test results.  What to do if you are LOW RISK for COVID-19?  Reduce your risk of any infection by using the same precautions used for avoiding the common cold or flu:  Marland Kitchen Wash your hands often with soap and warm water for at least 20 seconds.  If soap and water are not readily available, use an alcohol-based hand sanitizer with at least 60% alcohol.  Marland Kitchen  If coughing or sneezing, cover your mouth and nose by coughing or sneezing into the elbow areas of your shirt or coat, into a tissue or into your sleeve (not your hands). . Avoid shaking hands with others and consider head nods or verbal greetings only. . Avoid touching your eyes, nose, or mouth with unwashed hands.  . Avoid close contact with people who are sick. . Avoid places or events with large numbers of people in one  location, like concerts or sporting events. . Carefully consider travel plans you have or are making. . If you are planning any travel outside or inside the [place], visit the CDC's Travelers' Health webpage for the latest health notices. . If you have some symptoms but not all symptoms, continue to monitor at home and seek medical attention if your symptoms worsen. . If you are having a medical emergency, call 911.   [place] / e-Visit: [email or web address]         [facility]: [facility]: [phone]                   [facility]: [phone]

## 2019-08-10 NOTE — Progress Notes (Signed)
MD ok with continuing 430 mg dosing of Carboplatin.  Larene Beach, PharmD

## 2019-08-10 NOTE — Progress Notes (Addendum)
[facility] OFFICE PROGRESS NOTE   Diagnosis: Non-small cell lung cancer  INTERVAL HISTORY:   Barbara Watkins returns as scheduled.  She completed cycle 2 carboplatin/Alimta/pembrolizumab on 07/20/2019.  She was seen for follow-up of a skin rash on 08/03/2019.  The rash has increased, felt to likely be related to pembrolizumab.  She was started on a course of oral steroids.  The skin rash is better.  She is no longer having pruritus.  We have prescribed prednisone 30 mg daily.  She has been taking 20 mg daily.  She denies significant nausea.  No mouth sores.  No diarrhea.  She has stable dyspnea on exertion.  No fever or cough.  Objective:  Vital signs in last 24 hours:  Blood pressure 129/83, pulse 88, temperature 98.7 F (37.1 C), temperature source Temporal, resp. rate 16, height '5\' 4"'$  (1.626 m), weight 108 lb 8 oz (49.2 kg), SpO2 99 %.    HEENT: No thrush or ulcers. GI: Abdomen soft and nontender.  No hepatomegaly. Vascular: No leg edema. Neuro: Alert and oriented. Skin: Dry appearing, faintly erythematous, skin rash over the trunk.  Erythematous rash over the forearms, no longer raised.   Lab Results:  Lab Results  Component Value Date   WBC 6.1 08/10/2019   HGB 9.8 (L) 08/10/2019   HCT 31.2 (L) 08/10/2019   MCV 81.0 08/10/2019   PLT 426 (H) 08/10/2019   NEUTROABS 3.1 08/10/2019    Imaging:  No results found.  Medications: I have reviewed the patient's current medications.  Assessment/Plan: 1.Metastatic non-small cell lung cancer   CTabdomen/pelvis 06/12/2019-scattered soft tissue masses in left hepatic lobe, right kidney, and the right adrenal gland. Fullness of the left hilum suspicious for left hilar mass and small nodules at the right lung base. Also noted signs of right pyelonephritis.  CT chest 06/13/2019-large encasing soft tissue mass left hilum measuring at least 5.8 x 4.5 x 3.8 cm with narrowing of the left mainstem bronchus and left  pulmonary artery. Near-total obstructive atelectasis of the left upper lobe with a portion of the lingula remaining aerated. Multiple bilateral pulmonary nodules.  Biopsy left supraclavicular lymph node 06/15/2019-metastatic lung adenocarcinoma; immunohistochemistry positive for cytokeratin 7, TTF-1, PAX 8 (weak). Napsin-A and CDX-2negative.Foundation 1-microsatellite stable, tumor mutational burden 15, ATM alteration, KRAS G13D;tumor proportion score 100%  Brain CT 06/29/2019-negative for metastatic disease  Cycle 1 carboplatin/Alimta/pembrolizumab 06/29/2019  Cycle 2 carboplatin/Alimta/pembrolizumab 07/20/2019  Cycle 3 carboplatin/Alimta 08/10/2019, pembrolizumab held due to rash 2. Anemia 3.Urinary tract infection 4. 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 Nausea 6. Constipation 7.Right abdominal pain secondary to #1 8.Skin rashbilateral forearms, grade 2, likely related to pembrolizumab 07/20/2019; grade 3 08/03/2019, prednisone initiated; rash improved 08/10/2019, prednisone decreased to 10 mg daily x7 days then discontinue  Disposition: [name] appears stable.  She has completed 2 cycles of carboplatin/Alimta/pembrolizumab.  She developed a skin rash likely related to pembrolizumab.  She is on steroids at present.  The rash is better.  She will receive carboplatin and Alimta today.  Pembrolizumab will be held.  She will decrease prednisone to 10 mg a day for 1 week and then discontinue.  She will undergo restaging CTs after she has completed 4 cycles of systemic therapy.  We reviewed the CBC from today.  Counts adequate to proceed with treatment.  She will return for lab, follow-up, carboplatin/Alimta, possible pembrolizumab in 3 weeks.  She will contact the office in the interim with any problems.  Patient seen with Dr.  Sherrill.    Ned Card ANP/GNP-BC   08/10/2019  12:52 PM  This was a shared visit with Ned Card.  [name] was interviewed and  examined.  The rash has improved.  She will complete a cycle of Alimta/carboplatin today.  The prednisone will be tapered to off.  We will consider resuming pembrolizumab when she returns in 3 weeks.  [name], MD

## 2019-08-11 ENCOUNTER — Telehealth: Payer: Self-pay | Admitting: Nurse Practitioner

## 2019-08-11 NOTE — Telephone Encounter (Signed)
Called and left msg. Mailed printout  °

## 2019-08-27 ENCOUNTER — Other Ambulatory Visit: Payer: Self-pay | Admitting: Oncology

## 2019-08-30 MED FILL — FOLIC ACID 1 MG TABS: 1 | 30 days supply | Qty: 30 | Fill #1

## 2019-08-31 ENCOUNTER — Other Ambulatory Visit: Payer: Self-pay

## 2019-08-31 ENCOUNTER — Ambulatory Visit: Payer: Self-pay

## 2019-08-31 ENCOUNTER — Inpatient Hospital Stay: Payer: Self-pay

## 2019-08-31 ENCOUNTER — Inpatient Hospital Stay (HOSPITAL_BASED_OUTPATIENT_CLINIC_OR_DEPARTMENT_OTHER): Payer: Self-pay | Admitting: Oncology

## 2019-08-31 ENCOUNTER — Inpatient Hospital Stay: Payer: Self-pay | Attending: Nurse Practitioner

## 2019-08-31 ENCOUNTER — Ambulatory Visit: Payer: Self-pay | Admitting: Oncology

## 2019-08-31 VITALS — BP 124/77 | HR 106 | Temp 98.7°F | Resp 18 | Ht 64.0 in | Wt 112.3 lb

## 2019-08-31 VITALS — HR 95

## 2019-08-31 DIAGNOSIS — C799 Secondary malignant neoplasm of unspecified site: Secondary | ICD-10-CM

## 2019-08-31 DIAGNOSIS — Z79899 Other long term (current) drug therapy: Secondary | ICD-10-CM | POA: Insufficient documentation

## 2019-08-31 DIAGNOSIS — N390 Urinary tract infection, site not specified: Secondary | ICD-10-CM | POA: Insufficient documentation

## 2019-08-31 DIAGNOSIS — C770 Secondary and unspecified malignant neoplasm of lymph nodes of head, face and neck: Secondary | ICD-10-CM | POA: Insufficient documentation

## 2019-08-31 DIAGNOSIS — C3412 Malignant neoplasm of upper lobe, left bronchus or lung: Secondary | ICD-10-CM | POA: Insufficient documentation

## 2019-08-31 DIAGNOSIS — R110 Nausea: Secondary | ICD-10-CM | POA: Insufficient documentation

## 2019-08-31 DIAGNOSIS — C3492 Malignant neoplasm of unspecified part of left bronchus or lung: Secondary | ICD-10-CM

## 2019-08-31 DIAGNOSIS — Z5111 Encounter for antineoplastic chemotherapy: Secondary | ICD-10-CM | POA: Insufficient documentation

## 2019-08-31 DIAGNOSIS — R21 Rash and other nonspecific skin eruption: Secondary | ICD-10-CM | POA: Insufficient documentation

## 2019-08-31 DIAGNOSIS — D649 Anemia, unspecified: Secondary | ICD-10-CM | POA: Insufficient documentation

## 2019-08-31 DIAGNOSIS — K5900 Constipation, unspecified: Secondary | ICD-10-CM | POA: Insufficient documentation

## 2019-08-31 LAB — CBC WITH DIFFERENTIAL (CANCER CENTER ONLY)
Abs Immature Granulocytes: 0.01 10*3/uL (ref 0.00–0.07)
Basophils Absolute: 0 10*3/uL (ref 0.0–0.1)
Basophils Relative: 1 %
Eosinophils Absolute: 0.1 10*3/uL (ref 0.0–0.5)
Eosinophils Relative: 3 %
HCT: 26.5 % — ABNORMAL LOW (ref 36.0–46.0)
Hemoglobin: 8.4 g/dL — ABNORMAL LOW (ref 12.0–15.0)
Immature Granulocytes: 0 %
Lymphocytes Relative: 40 %
Lymphs Abs: 1.3 10*3/uL (ref 0.7–4.0)
MCH: 27.3 pg (ref 26.0–34.0)
MCHC: 31.7 g/dL (ref 30.0–36.0)
MCV: 86 fL (ref 80.0–100.0)
Monocytes Absolute: 0.4 10*3/uL (ref 0.1–1.0)
Monocytes Relative: 13 %
Neutro Abs: 1.4 10*3/uL — ABNORMAL LOW (ref 1.7–7.7)
Neutrophils Relative %: 43 %
Platelet Count: 307 10*3/uL (ref 150–400)
RBC: 3.08 MIL/uL — ABNORMAL LOW (ref 3.87–5.11)
RDW: 28.9 % — ABNORMAL HIGH (ref 11.5–15.5)
WBC Count: 3.2 10*3/uL — ABNORMAL LOW (ref 4.0–10.5)
nRBC: 0 % (ref 0.0–0.2)

## 2019-08-31 LAB — CMP (CANCER CENTER ONLY)
ALT: 17 U/L (ref 0–44)
AST: 16 U/L (ref 15–41)
Albumin: 3.4 g/dL — ABNORMAL LOW (ref 3.5–5.0)
Alkaline Phosphatase: 123 U/L (ref 38–126)
Anion gap: 11 (ref 5–15)
BUN: 11 mg/dL (ref 6–20)
CO2: 23 mmol/L (ref 22–32)
Calcium: 10.2 mg/dL (ref 8.9–10.3)
Chloride: 105 mmol/L (ref 98–111)
Creatinine: 0.88 mg/dL (ref 0.44–1.00)
GFR, Est AFR Am: >60 mL/min (ref >60)
GFR, Estimated: >60 mL/min (ref >60)
Glucose, Bld: 145 mg/dL — ABNORMAL HIGH (ref 70–99)
Potassium: 4.1 mmol/L (ref 3.5–5.1)
Sodium: 139 mmol/L (ref 135–145)
Total Bilirubin: <0.2 mg/dL — ABNORMAL LOW (ref 0.3–1.2)
Total Protein: 6.7 g/dL (ref 6.5–8.1)

## 2019-08-31 MED ORDER — PALONOSETRON HCL INJECTION 0.25 MG/5ML
0.2500 mg | Freq: Once | INTRAVENOUS | Status: AC
  Administered 2019-08-31: 13:00:00 0.25 mg via INTRAVENOUS

## 2019-08-31 MED ORDER — SODIUM CHLORIDE 0.9 % IV SOLN
510.0000 mg/m2 | Freq: Once | INTRAVENOUS | Status: AC
  Administered 2019-08-31: 800 mg via INTRAVENOUS
  Filled 2019-08-31: qty 12

## 2019-08-31 MED ORDER — SODIUM CHLORIDE 0.9 % IV SOLN
415.5000 mg | Freq: Once | INTRAVENOUS | Status: AC
  Administered 2019-08-31: 15:00:00 420 mg via INTRAVENOUS
  Filled 2019-08-31: qty 42

## 2019-08-31 MED ORDER — SODIUM CHLORIDE 0.9 % IV SOLN
Freq: Once | INTRAVENOUS | Status: AC
  Administered 2019-08-31: 14:00:00 via INTRAVENOUS
  Filled 2019-08-31: qty 5

## 2019-08-31 MED ORDER — SODIUM CHLORIDE 0.9 % IV SOLN
Freq: Once | INTRAVENOUS | Status: AC
  Administered 2019-08-31: 13:00:00 via INTRAVENOUS
  Filled 2019-08-31: qty 250

## 2019-08-31 MED ORDER — PALONOSETRON HCL INJECTION 0.25 MG/5ML
INTRAVENOUS | Status: AC
  Filled 2019-08-31: qty 5

## 2019-08-31 NOTE — Patient Instructions (Signed)
[facility] Discharge Instructions for Patients Receiving Chemotherapy  Today you received the following chemotherapy agents: Pemetrexed (Alimta), and Carboplatin (Paraplatin)  To help prevent nausea and vomiting after your treatment, we encourage you to take your nausea medication as directed.   If you develop nausea and vomiting that is not controlled by your nausea medication, call the clinic.   BELOW ARE SYMPTOMS THAT SHOULD BE REPORTED IMMEDIATELY:  *FEVER GREATER THAN 100.5 F  *CHILLS WITH OR WITHOUT FEVER  NAUSEA AND VOMITING THAT IS NOT CONTROLLED WITH YOUR NAUSEA MEDICATION  *UNUSUAL SHORTNESS OF BREATH  *UNUSUAL BRUISING OR BLEEDING  TENDERNESS IN MOUTH AND THROAT WITH OR WITHOUT PRESENCE OF ULCERS  *URINARY PROBLEMS  *BOWEL PROBLEMS  UNUSUAL RASH Items with * indicate a potential emergency and should be followed up as soon as possible.  Feel free to call the clinic should you have any questions or concerns. The clinic phone number is [phone].  Please show the [facility] at check-in to the Emergency Department and triage nurse.  Coronavirus (COVID-19) Are you at risk?  Are you at risk for the Coronavirus (COVID-19)?  To be considered HIGH RISK for Coronavirus (COVID-19), you have to meet the following criteria:  . Traveled to [place], [place], [place], [place] or [place]; or in the [place] to [place], [place], [place], or [place]; and have fever, cough, and shortness of breath within the last 2 weeks of travel OR . Been in close contact with a person diagnosed with COVID-19 within the last 2 weeks and have fever, cough, and shortness of breath . IF YOU DO NOT MEET THESE CRITERIA, YOU ARE CONSIDERED LOW RISK FOR COVID-19.  What to do if you are HIGH RISK for COVID-19?  Marland Kitchen If you are having a medical emergency, call 911. . Seek medical care right away. Before you go to a doctor's office, urgent care or emergency department,  call ahead and tell them about your recent travel, contact with someone diagnosed with COVID-19, and your symptoms. You should receive instructions from your physician's office regarding next steps of care.  . When you arrive at healthcare provider, tell the healthcare staff immediately you have returned from visiting [place], [place], [place], [place] or [place]; or traveled in the [place] to [place], [place], [place], or [place]; in the last two weeks or you have been in close contact with a person diagnosed with COVID-19 in the last 2 weeks.   . Tell the health care staff about your symptoms: fever, cough and shortness of breath. . After you have been seen by a medical provider, you will be either: o Tested for (COVID-19) and discharged home on quarantine except to seek medical care if symptoms worsen, and asked to  - Stay home and avoid contact with others until you get your results (4-5 days)  - Avoid travel on public transportation if possible (such as bus, train, or airplane) or o Sent to the Emergency Department by EMS for evaluation, COVID-19 testing, and possible admission depending on your condition and test results.  What to do if you are LOW RISK for COVID-19?  Reduce your risk of any infection by using the same precautions used for avoiding the common cold or flu:  Marland Kitchen Wash your hands often with soap and warm water for at least 20 seconds.  If soap and water are not readily available, use an alcohol-based hand sanitizer with at least 60% alcohol.  Marland Kitchen  If coughing or sneezing, cover your mouth and nose by coughing or sneezing into the elbow areas of your shirt or coat, into a tissue or into your sleeve (not your hands). . Avoid shaking hands with others and consider head nods or verbal greetings only. . Avoid touching your eyes, nose, or mouth with unwashed hands.  . Avoid close contact with people who are sick. . Avoid places or events with large numbers of people in one  location, like concerts or sporting events. . Carefully consider travel plans you have or are making. . If you are planning any travel outside or inside the [place], visit the CDC's Travelers' Health webpage for the latest health notices. . If you have some symptoms but not all symptoms, continue to monitor at home and seek medical attention if your symptoms worsen. . If you are having a medical emergency, call 911.   [facility] / e-Visit: [email or web address]         [facility]: [facility]: [phone]                   [facility]: [phone]

## 2019-08-31 NOTE — Progress Notes (Signed)
[facility] OFFICE PROGRESS NOTE   Diagnosis: Lung cancer  INTERVAL HISTORY:   Barbara Watkins completed a cycle of Alimta and carboplatin on 08/10/2019.  She reports tolerated chemotherapy well.  She has no pain.  The rash has resolved.  She is no longer taking prednisone.  Objective:  Vital signs in last 24 hours:  Blood pressure 124/77, pulse (!) 106, temperature 98.7 F (37.1 C), temperature source Oral, resp. rate 18, height 5' 4" (1.626 m), weight 112 lb 4.8 oz (50.9 kg), SpO2 100 %.    HEENT: No thrush or ulcers Resp: Lungs clear bilaterally Cardio: Regular rate and rhythm GI: No hepatosplenomegaly Vascular: No leg edema Skin: Dryness over the trunk, no rash    Lab Results:  Lab Results  Component Value Date   WBC 3.2 (L) 08/31/2019   HGB 8.4 (L) 08/31/2019   HCT 26.5 (L) 08/31/2019   MCV 86.0 08/31/2019   PLT 307 08/31/2019   NEUTROABS 1.4 (L) 08/31/2019    CMP  Lab Results  Component Value Date   NA 139 08/31/2019   K 4.1 08/31/2019   CL 105 08/31/2019   CO2 23 08/31/2019   GLUCOSE 145 (H) 08/31/2019   BUN 11 08/31/2019   CREATININE 0.88 08/31/2019   CALCIUM 10.2 08/31/2019   PROT 6.7 08/31/2019   ALBUMIN 3.4 (L) 08/31/2019   AST 16 08/31/2019   ALT 17 08/31/2019   ALKPHOS 123 08/31/2019   BILITOT <0.2 (L) 08/31/2019   GFRNONAA >60 08/31/2019   GFRAA >60 08/31/2019     Medications: I have reviewed the patient's current medications.   Assessment/Plan:  1. Metastatic non-small cell lung cancer   CTabdomen/pelvis 06/12/2019-scattered soft tissue masses in left hepatic lobe, right kidney, and the right adrenal gland. Fullness of the left hilum suspicious for left hilar mass and small nodules at the right lung base. Also noted signs of right pyelonephritis.  CT chest 06/13/2019-large encasing soft tissue mass left hilum measuring at least 5.8 x 4.5 x 3.8 cm with narrowing of the left mainstem bronchus and left pulmonary  artery. Near-total obstructive atelectasis of the left upper lobe with a portion of the lingula remaining aerated. Multiple bilateral pulmonary nodules.  Biopsy left supraclavicular lymph node 06/15/2019-metastatic lung adenocarcinoma; immunohistochemistry positive for cytokeratin 7, TTF-1, PAX 8 (weak). Napsin-A and CDX-2negative.Foundation 1-microsatellite stable, tumor mutational burden 15, ATM alteration, KRAS G13D;tumor proportion score 100%  Brain CT 06/29/2019-negative for metastatic disease  Cycle 1 carboplatin/Alimta/pembrolizumab 06/29/2019  Cycle 2 carboplatin/Alimta/pembrolizumab 07/20/2019  Cycle 3 carboplatin/Alimta 08/10/2019, pembrolizumab held due to rash  Cycle 4 carboplatin/Alimta, Udenyca added, pembrolizumab held 2. Anemia 3.Urinary tract infection 4. 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 Nausea 6. Constipation 7.Right abdominal pain secondary to #1 8.Skin rashbilateral forearms, grade 2, likely related to pembrolizumab 07/20/2019; grade 3 08/03/2019, prednisone initiated; rash improved 08/10/2019, prednisone decreased to 10 mg daily x7 days then discontinued   Disposition: Barbara Watkins appears well.  The diffuse rash has resolved.  We will hold pembrolizumab again today consider resuming pembrolizumab with the next cycle.  She will completed treatment with Alimta/carboplatin today.  She has mild neutropenia.  She understands the risk of infection.  She will call for a fever.  She will receive the Udenyca with this cycle.  We reviewed potential toxicities associated with the Udenyca and she agrees to proceed.  Barbara Watkins will undergo a restaging CT evaluation after this cycle.  The plan is to continue Alimta/pembrolizumab if there is CT  evidence of a response to therapy.  Betsy Coder, MD  08/31/2019  12:32 PM

## 2019-08-31 NOTE — Progress Notes (Deleted)
Towards later part of infusion, patient appearing to be confused or unable to get words out. Unclear to nurse which was the problem. RN asked nurse who the emergency contact was in relation to the patient and patient unable to convey that contact was her daughter. Daughter came to pick up and discussed mental state with daughter who did not seem like this was unusual or out of ordinary for patient. Stated "she was that way this morning too. It comes and goes." Discussed patient's mentation with Carlyon Prows, RN who will relay the message to Dr. Irene Limbo.

## 2019-08-31 NOTE — Progress Notes (Signed)
Dr Benay Spice aware of ANC 1.4 , ordering Udenyca for pt.

## 2019-09-01 ENCOUNTER — Telehealth: Payer: Self-pay | Admitting: Oncology

## 2019-09-01 NOTE — Telephone Encounter (Signed)
Called and left msg. Mailed printout  °

## 2019-09-02 ENCOUNTER — Inpatient Hospital Stay: Payer: Self-pay

## 2019-09-02 ENCOUNTER — Other Ambulatory Visit: Payer: Self-pay

## 2019-09-02 VITALS — BP 129/75 | HR 85 | Temp 98.7°F | Resp 18

## 2019-09-02 DIAGNOSIS — C3492 Malignant neoplasm of unspecified part of left bronchus or lung: Secondary | ICD-10-CM

## 2019-09-02 MED ORDER — PEGFILGRASTIM-CBQV 6 MG/0.6ML ~~LOC~~ SOSY
PREFILLED_SYRINGE | SUBCUTANEOUS | Status: AC
  Filled 2019-09-02: qty 0.6

## 2019-09-02 MED ORDER — PEGFILGRASTIM-CBQV 6 MG/0.6ML ~~LOC~~ SOSY
6.0000 mg | PREFILLED_SYRINGE | Freq: Once | SUBCUTANEOUS | Status: AC
  Administered 2019-09-02: 15:00:00 6 mg via SUBCUTANEOUS

## 2019-09-02 NOTE — Patient Instructions (Signed)

## 2019-09-06 ENCOUNTER — Encounter: Payer: Self-pay | Admitting: Pharmacy Technician

## 2019-09-06 NOTE — Progress Notes (Signed)
The patient is approved for drug assistance for [place].  Enrollment is effective until 09/05/20 and is based on self pay.  Drug replacement will begin on DOS 09/02/19 for Udenyca and DOS 06/29/19 for [place].

## 2019-09-07 MED FILL — PROCHLORPERAZINE 5 MG TAB: 5 | 7 days supply | Qty: 30 | Fill #0

## 2019-09-08 MED FILL — ONDANSETRON HCL 4 MG TABLET: 4 | 6 days supply | Qty: 20 | Fill #1

## 2019-09-17 ENCOUNTER — Other Ambulatory Visit: Payer: Self-pay | Admitting: Oncology

## 2019-09-22 ENCOUNTER — Telehealth: Payer: Self-pay | Admitting: *Deleted

## 2019-09-22 ENCOUNTER — Other Ambulatory Visit: Payer: Self-pay

## 2019-09-22 ENCOUNTER — Inpatient Hospital Stay: Payer: Self-pay

## 2019-09-22 ENCOUNTER — Encounter: Payer: Self-pay | Admitting: Nurse Practitioner

## 2019-09-22 ENCOUNTER — Inpatient Hospital Stay (HOSPITAL_BASED_OUTPATIENT_CLINIC_OR_DEPARTMENT_OTHER): Payer: Self-pay | Admitting: Nurse Practitioner

## 2019-09-22 VITALS — BP 125/69 | HR 85 | Temp 98.5°F | Resp 18 | Ht 64.0 in | Wt 112.2 lb

## 2019-09-22 DIAGNOSIS — C3492 Malignant neoplasm of unspecified part of left bronchus or lung: Secondary | ICD-10-CM

## 2019-09-22 LAB — CBC WITH DIFFERENTIAL (CANCER CENTER ONLY)
Abs Immature Granulocytes: 0.1 10*3/uL — ABNORMAL HIGH (ref 0.00–0.07)
Basophils Absolute: 0.1 10*3/uL (ref 0.0–0.1)
Basophils Relative: 1 %
Eosinophils Absolute: 0.2 10*3/uL (ref 0.0–0.5)
Eosinophils Relative: 2 %
HCT: 24.5 % — ABNORMAL LOW (ref 36.0–46.0)
Hemoglobin: 7.6 g/dL — ABNORMAL LOW (ref 12.0–15.0)
Immature Granulocytes: 1 %
Lymphocytes Relative: 21 %
Lymphs Abs: 1.6 10*3/uL (ref 0.7–4.0)
MCH: 29.1 pg (ref 26.0–34.0)
MCHC: 31 g/dL (ref 30.0–36.0)
MCV: 93.9 fL (ref 80.0–100.0)
Monocytes Absolute: 0.6 10*3/uL (ref 0.1–1.0)
Monocytes Relative: 8 %
Neutro Abs: 5.3 10*3/uL (ref 1.7–7.7)
Neutrophils Relative %: 67 %
Platelet Count: 412 10*3/uL — ABNORMAL HIGH (ref 150–400)
RBC: 2.61 MIL/uL — ABNORMAL LOW (ref 3.87–5.11)
WBC Count: 7.9 10*3/uL (ref 4.0–10.5)
nRBC: 0 % (ref 0.0–0.2)

## 2019-09-22 LAB — CMP (CANCER CENTER ONLY)
ALT: 7 U/L (ref 0–44)
AST: 14 U/L — ABNORMAL LOW (ref 15–41)
Albumin: 3.4 g/dL — ABNORMAL LOW (ref 3.5–5.0)
Alkaline Phosphatase: 153 U/L — ABNORMAL HIGH (ref 38–126)
Anion gap: 8 (ref 5–15)
BUN: 8 mg/dL (ref 6–20)
CO2: 25 mmol/L (ref 22–32)
Calcium: 10.1 mg/dL (ref 8.9–10.3)
Chloride: 107 mmol/L (ref 98–111)
Creatinine: 0.91 mg/dL (ref 0.44–1.00)
GFR, Est AFR Am: >60 mL/min (ref >60)
GFR, Estimated: >60 mL/min (ref >60)
Glucose, Bld: 107 mg/dL — ABNORMAL HIGH (ref 70–99)
Potassium: 3.9 mmol/L (ref 3.5–5.1)
Sodium: 140 mmol/L (ref 135–145)
Total Bilirubin: <0.2 mg/dL — ABNORMAL LOW (ref 0.3–1.2)
Total Protein: 6.7 g/dL (ref 6.5–8.1)

## 2019-09-22 MED ORDER — SODIUM CHLORIDE 0.9 % IV SOLN
510.0000 mg/m2 | Freq: Once | INTRAVENOUS | Status: AC
  Administered 2019-09-22: 12:00:00 800 mg via INTRAVENOUS
  Filled 2019-09-22: qty 20

## 2019-09-22 MED ORDER — SODIUM CHLORIDE 0.9 % IV SOLN
Freq: Once | INTRAVENOUS | Status: AC
  Administered 2019-09-22: 11:00:00 via INTRAVENOUS
  Filled 2019-09-22: qty 250

## 2019-09-22 MED ORDER — PROCHLORPERAZINE MALEATE 10 MG PO TABS
10.0000 mg | ORAL_TABLET | Freq: Once | ORAL | Status: AC
  Administered 2019-09-22: 10 mg via ORAL

## 2019-09-22 MED ORDER — PROCHLORPERAZINE MALEATE 10 MG PO TABS
ORAL_TABLET | ORAL | Status: AC
  Filled 2019-09-22: qty 1

## 2019-09-22 NOTE — Telephone Encounter (Signed)
Per Ned Card, NP, scheduled pt for CT w/con. On 10/8 @ 1015 am. Pt made aware of date and time. Advised pt to arrive 15-30 min prior to scan. Nothing to eat or drink 4 hr prior to scan and to pick up prep. Pt verbalized understanding.

## 2019-09-22 NOTE — Patient Instructions (Signed)
[facility] Discharge Instructions for Patients Receiving Chemotherapy  Today you received the following chemotherapy agents Alimta.  To help prevent nausea and vomiting after your treatment, we encourage you to take your nausea medication as directed.  If you develop nausea and vomiting that is not controlled by your nausea medication, call the clinic.   BELOW ARE SYMPTOMS THAT SHOULD BE REPORTED IMMEDIATELY:  *FEVER GREATER THAN 100.5 F  *CHILLS WITH OR WITHOUT FEVER  NAUSEA AND VOMITING THAT IS NOT CONTROLLED WITH YOUR NAUSEA MEDICATION  *UNUSUAL SHORTNESS OF BREATH  *UNUSUAL BRUISING OR BLEEDING  TENDERNESS IN MOUTH AND THROAT WITH OR WITHOUT PRESENCE OF ULCERS  *URINARY PROBLEMS  *BOWEL PROBLEMS  UNUSUAL RASH Items with * indicate a potential emergency and should be followed up as soon as possible.  Feel free to call the clinic should you have any questions or concerns. The clinic phone number is [phone].  Please show the [facility] at check-in to the Emergency Department and triage nurse.

## 2019-09-22 NOTE — Progress Notes (Signed)
[facility] OFFICE PROGRESS NOTE   Diagnosis: Lung cancer  INTERVAL HISTORY:   [name] returns as scheduled.  She completed cycle 4 carboplatin/Alimta 08/31/2019, Pembrolizumab held.  Skin rash continues to be resolved.  She denies nausea/vomiting.  No mouth sores.  No diarrhea.  No tearing.  She denies fever, cough, shortness of breath.  No bleeding.  Objective:  Vital signs in last 24 hours:  Blood pressure 125/69, pulse 85, temperature 98.5 F (36.9 C), temperature source Oral, resp. rate 18, height '5\' 4"'$  (1.626 m), weight 112 lb 3.2 oz (50.9 kg), SpO2 100 %.    HEENT: No thrush or ulcers. GI: Abdomen soft and nontender.  No hepatomegaly. Vascular: No leg edema. Neuro: Alert and oriented. Skin: Skin is dry, no rash.   Lab Results:  Lab Results  Component Value Date   WBC 7.9 09/22/2019   HGB 7.6 (L) 09/22/2019   HCT 24.5 (L) 09/22/2019   MCV 93.9 09/22/2019   PLT 412 (H) 09/22/2019   NEUTROABS 5.3 09/22/2019    Imaging:  No results found.  Medications: I have reviewed the patient's current medications.  Assessment/Plan: 1. Metastatic non-small cell lung cancer   CTabdomen/pelvis 06/12/2019-scattered soft tissue masses in left hepatic lobe, right kidney, and the right adrenal gland. Fullness of the left hilum suspicious for left hilar mass and small nodules at the right lung base. Also noted signs of right pyelonephritis.  CT chest 06/13/2019-large encasing soft tissue mass left hilum measuring at least 5.8 x 4.5 x 3.8 cm with narrowing of the left mainstem bronchus and left pulmonary artery. Near-total obstructive atelectasis of the left upper lobe with a portion of the lingula remaining aerated. Multiple bilateral pulmonary nodules.  Biopsy left supraclavicular lymph node 06/15/2019-metastatic lung adenocarcinoma; immunohistochemistry positive for cytokeratin 7, TTF-1, PAX 8 (weak). Napsin-A and CDX-2negative.Foundation  1-microsatellite stable, tumor mutational burden 15, ATM alteration, KRAS G13D;tumor proportion score 100%  Brain CT 06/29/2019-negative for metastatic disease  Cycle 1 carboplatin/Alimta/pembrolizumab 06/29/2019  Cycle 2 carboplatin/Alimta/pembrolizumab 07/20/2019  Cycle 3 carboplatin/Alimta 08/10/2019, pembrolizumab held due to rash  Cycle 4 carboplatin/Alimta, Udenyca added, pembrolizumab held 08/31/2019  Cycle 5 Alimta alone (carboplatin discontinued, pembrolizumab held) 09/22/2019 2. Anemia 3.Urinary tract infection 4. 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 Nausea 6. Constipation 7.Right abdominal pain secondary to #1 8.Skin rashbilateral forearms, grade 2, likely related to pembrolizumab7/22/2020; grade 3 08/03/2019, prednisone initiated; rash improved 08/10/2019, prednisone decreased to 10 mg daily x7 days then discontinued  Disposition: [name] appears stable.  She has completed 4 cycles of carboplatin/Alimta.  She received pembrolizumab with cycles 1 and 2, subsequently placed on hold due to a skin rash.  She was referred for restaging CTs after her last visit.  Unfortunately this was never scheduled.  Plan to proceed with a cycle of Alimta alone today, restaging CTs prior to her next visit.  Continue to hold pembrolizumab, consider resuming in 3 weeks.  We reviewed the CBC from today.  She has progressive anemia.  She is asymptomatic.  Counts adequate to proceed with treatment.  She will not receive Udenyca with this cycle.  She will return for lab, follow-up, treatment in 3 weeks.  She will have restaging CTs a few days prior.  She will contact the office prior to her next visit with any problems.  Plan reviewed with Dr. Benay Spice.    Ned Card ANP/GNP-BC   09/22/2019  10:40 AM

## 2019-09-23 ENCOUNTER — Telehealth: Payer: Self-pay | Admitting: Oncology

## 2019-09-23 NOTE — Telephone Encounter (Signed)
Called and left msg. Mailed printout  °

## 2019-10-05 MED FILL — FOLIC ACID 1 MG TABS: 1 | 30 days supply | Qty: 30 | Fill #2

## 2019-10-06 ENCOUNTER — Encounter (HOSPITAL_COMMUNITY): Payer: Self-pay

## 2019-10-06 ENCOUNTER — Ambulatory Visit (HOSPITAL_COMMUNITY)
Admission: RE | Admit: 2019-10-06 | Discharge: 2019-10-06 | Disposition: A | Discharge status: Home / Self Care | Payer: Medicaid Other | Source: Ambulatory Visit | Attending: Oncology | Admitting: Oncology

## 2019-10-06 ENCOUNTER — Other Ambulatory Visit: Payer: Self-pay

## 2019-10-06 DIAGNOSIS — C3492 Malignant neoplasm of unspecified part of left bronchus or lung: Secondary | ICD-10-CM | POA: Insufficient documentation

## 2019-10-06 MED ORDER — SODIUM CHLORIDE (PF) 0.9 % IJ SOLN
INTRAMUSCULAR | Status: AC
  Filled 2019-10-06: qty 50

## 2019-10-06 MED ORDER — IOHEXOL 300 MG/ML  SOLN
100.0000 mL | Freq: Once | INTRAMUSCULAR | Status: AC | PRN
  Administered 2019-10-06: 100 mL via INTRAVENOUS

## 2019-10-07 ENCOUNTER — Telehealth: Payer: Self-pay | Admitting: *Deleted

## 2019-10-07 NOTE — Telephone Encounter (Signed)
Notified of CT results.

## 2019-10-07 NOTE — Telephone Encounter (Signed)
-----   Message from Ladell Pier, MD sent at 10/06/2019  4:45 PM EDT ----- Please call patient, CT shows improvement of the cancer and  the Chest and abdomen, no evidence of progressive disease

## 2019-10-09 ENCOUNTER — Other Ambulatory Visit: Payer: Self-pay | Admitting: Oncology

## 2019-10-10 ENCOUNTER — Telehealth: Payer: Self-pay | Admitting: Oncology

## 2019-10-10 NOTE — Telephone Encounter (Signed)
Returned patient's phone call regarding rescheduling an appointment, informed patient date requested is not available. Patient will keep appointments as is.

## 2019-10-14 ENCOUNTER — Inpatient Hospital Stay: Payer: Medicaid Other

## 2019-10-14 ENCOUNTER — Inpatient Hospital Stay: Payer: Medicaid Other | Attending: Nurse Practitioner | Admitting: Oncology

## 2019-10-14 ENCOUNTER — Telehealth: Payer: Self-pay | Admitting: Nurse Practitioner

## 2019-10-14 ENCOUNTER — Telehealth: Payer: Self-pay | Admitting: Oncology

## 2019-10-14 DIAGNOSIS — K5900 Constipation, unspecified: Secondary | ICD-10-CM | POA: Insufficient documentation

## 2019-10-14 DIAGNOSIS — R110 Nausea: Secondary | ICD-10-CM | POA: Insufficient documentation

## 2019-10-14 DIAGNOSIS — N390 Urinary tract infection, site not specified: Secondary | ICD-10-CM | POA: Insufficient documentation

## 2019-10-14 DIAGNOSIS — C770 Secondary and unspecified malignant neoplasm of lymph nodes of head, face and neck: Secondary | ICD-10-CM | POA: Insufficient documentation

## 2019-10-14 DIAGNOSIS — C3412 Malignant neoplasm of upper lobe, left bronchus or lung: Secondary | ICD-10-CM | POA: Insufficient documentation

## 2019-10-14 DIAGNOSIS — Z79899 Other long term (current) drug therapy: Secondary | ICD-10-CM | POA: Insufficient documentation

## 2019-10-14 DIAGNOSIS — Z5111 Encounter for antineoplastic chemotherapy: Secondary | ICD-10-CM | POA: Insufficient documentation

## 2019-10-14 DIAGNOSIS — C7902 Secondary malignant neoplasm of left kidney and renal pelvis: Secondary | ICD-10-CM | POA: Insufficient documentation

## 2019-10-14 DIAGNOSIS — R590 Localized enlarged lymph nodes: Secondary | ICD-10-CM | POA: Insufficient documentation

## 2019-10-14 DIAGNOSIS — D649 Anemia, unspecified: Secondary | ICD-10-CM | POA: Insufficient documentation

## 2019-10-14 DIAGNOSIS — Z23 Encounter for immunization: Secondary | ICD-10-CM | POA: Insufficient documentation

## 2019-10-14 NOTE — Telephone Encounter (Signed)
Returned patient's phone call regarding rescheduling an appointment, 10/16 missed appointment has moved to 10/19. Date approved by provider.

## 2019-10-14 NOTE — Telephone Encounter (Signed)
Returned patient's phone call regarding rescheduling an appointment, left a voicemail. 

## 2019-10-17 ENCOUNTER — Inpatient Hospital Stay: Payer: Medicaid Other | Admitting: Nurse Practitioner

## 2019-10-17 ENCOUNTER — Inpatient Hospital Stay (HOSPITAL_BASED_OUTPATIENT_CLINIC_OR_DEPARTMENT_OTHER): Payer: Medicaid Other | Admitting: Nurse Practitioner

## 2019-10-17 ENCOUNTER — Encounter: Payer: Self-pay | Admitting: Nurse Practitioner

## 2019-10-17 ENCOUNTER — Inpatient Hospital Stay: Payer: Medicaid Other

## 2019-10-17 ENCOUNTER — Other Ambulatory Visit: Payer: Self-pay

## 2019-10-17 VITALS — BP 146/80 | HR 86 | Temp 98.5°F | Resp 17 | Ht 64.0 in | Wt 114.4 lb

## 2019-10-17 DIAGNOSIS — Z5111 Encounter for antineoplastic chemotherapy: Secondary | ICD-10-CM | POA: Diagnosis present

## 2019-10-17 DIAGNOSIS — C9000 Multiple myeloma not having achieved remission: Secondary | ICD-10-CM

## 2019-10-17 DIAGNOSIS — Z23 Encounter for immunization: Secondary | ICD-10-CM | POA: Diagnosis not present

## 2019-10-17 DIAGNOSIS — D649 Anemia, unspecified: Secondary | ICD-10-CM | POA: Diagnosis not present

## 2019-10-17 DIAGNOSIS — C3412 Malignant neoplasm of upper lobe, left bronchus or lung: Secondary | ICD-10-CM | POA: Diagnosis not present

## 2019-10-17 DIAGNOSIS — R590 Localized enlarged lymph nodes: Secondary | ICD-10-CM | POA: Diagnosis not present

## 2019-10-17 DIAGNOSIS — C770 Secondary and unspecified malignant neoplasm of lymph nodes of head, face and neck: Secondary | ICD-10-CM | POA: Diagnosis not present

## 2019-10-17 DIAGNOSIS — Z79899 Other long term (current) drug therapy: Secondary | ICD-10-CM | POA: Diagnosis not present

## 2019-10-17 DIAGNOSIS — N390 Urinary tract infection, site not specified: Secondary | ICD-10-CM | POA: Diagnosis not present

## 2019-10-17 DIAGNOSIS — K5900 Constipation, unspecified: Secondary | ICD-10-CM | POA: Diagnosis not present

## 2019-10-17 DIAGNOSIS — C7902 Secondary malignant neoplasm of left kidney and renal pelvis: Secondary | ICD-10-CM | POA: Diagnosis not present

## 2019-10-17 DIAGNOSIS — C3492 Malignant neoplasm of unspecified part of left bronchus or lung: Secondary | ICD-10-CM

## 2019-10-17 DIAGNOSIS — R110 Nausea: Secondary | ICD-10-CM | POA: Diagnosis not present

## 2019-10-17 LAB — CMP (CANCER CENTER ONLY)
ALT: 12 U/L (ref 0–44)
AST: 17 U/L (ref 15–41)
Albumin: 3.8 g/dL (ref 3.5–5.0)
Alkaline Phosphatase: 119 U/L (ref 38–126)
Anion gap: 11 (ref 5–15)
BUN: 18 mg/dL (ref 6–20)
CO2: 21 mmol/L — ABNORMAL LOW (ref 22–32)
Calcium: 10.2 mg/dL (ref 8.9–10.3)
Chloride: 106 mmol/L (ref 98–111)
Creatinine: 1.04 mg/dL — ABNORMAL HIGH (ref 0.44–1.00)
GFR, Est AFR Am: >60 mL/min (ref >60)
GFR, Estimated: 59 mL/min — ABNORMAL LOW (ref >60)
Glucose, Bld: 100 mg/dL — ABNORMAL HIGH (ref 70–99)
Potassium: 3.9 mmol/L (ref 3.5–5.1)
Sodium: 138 mmol/L (ref 135–145)
Total Bilirubin: 0.5 mg/dL (ref 0.3–1.2)
Total Protein: 7.1 g/dL (ref 6.5–8.1)

## 2019-10-17 LAB — CBC WITH DIFFERENTIAL (CANCER CENTER ONLY)
Abs Immature Granulocytes: 0.01 10*3/uL (ref 0.00–0.07)
Basophils Absolute: 0.1 10*3/uL (ref 0.0–0.1)
Basophils Relative: 2 %
Eosinophils Absolute: 0.4 10*3/uL (ref 0.0–0.5)
Eosinophils Relative: 7 %
HCT: 29.6 % — ABNORMAL LOW (ref 36.0–46.0)
Hemoglobin: 9.5 g/dL — ABNORMAL LOW (ref 12.0–15.0)
Immature Granulocytes: 0 %
Lymphocytes Relative: 36 %
Lymphs Abs: 1.8 10*3/uL (ref 0.7–4.0)
MCH: 33 pg (ref 26.0–34.0)
MCHC: 32.1 g/dL (ref 30.0–36.0)
MCV: 102.8 fL — ABNORMAL HIGH (ref 80.0–100.0)
Monocytes Absolute: 0.5 10*3/uL (ref 0.1–1.0)
Monocytes Relative: 11 %
Neutro Abs: 2.2 10*3/uL (ref 1.7–7.7)
Neutrophils Relative %: 44 %
Platelet Count: 238 10*3/uL (ref 150–400)
RBC: 2.88 MIL/uL — ABNORMAL LOW (ref 3.87–5.11)
RDW: 17.4 % — ABNORMAL HIGH (ref 11.5–15.5)
WBC Count: 5 10*3/uL (ref 4.0–10.5)
nRBC: 0 % (ref 0.0–0.2)

## 2019-10-17 MED ORDER — PROCHLORPERAZINE MALEATE 10 MG PO TABS
10.0000 mg | ORAL_TABLET | Freq: Once | ORAL | Status: AC
  Administered 2019-10-17: 10 mg via ORAL

## 2019-10-17 MED ORDER — PROCHLORPERAZINE MALEATE 10 MG PO TABS
ORAL_TABLET | ORAL | Status: AC
  Filled 2019-10-17: qty 1

## 2019-10-17 MED ORDER — CYANOCOBALAMIN 1000 MCG/ML IJ SOLN
INTRAMUSCULAR | Status: AC
  Filled 2019-10-17: qty 1

## 2019-10-17 MED ORDER — SODIUM CHLORIDE 0.9 % IV SOLN
200.0000 mg | Freq: Once | INTRAVENOUS | Status: AC
  Administered 2019-10-17: 200 mg via INTRAVENOUS
  Filled 2019-10-17: qty 8

## 2019-10-17 MED ORDER — SODIUM CHLORIDE 0.9 % IV SOLN
Freq: Once | INTRAVENOUS | Status: AC
  Administered 2019-10-17: 17:00:00 via INTRAVENOUS
  Filled 2019-10-17: qty 250

## 2019-10-17 MED ORDER — INFLUENZA VAC SPLIT QUAD 0.5 ML IM SUSY
0.5000 mL | PREFILLED_SYRINGE | Freq: Once | INTRAMUSCULAR | Status: AC
  Administered 2019-10-17: 0.5 mL via INTRAMUSCULAR

## 2019-10-17 MED ORDER — INFLUENZA VAC SPLIT QUAD 0.5 ML IM SUSY
PREFILLED_SYRINGE | INTRAMUSCULAR | Status: AC
  Filled 2019-10-17: qty 0.5

## 2019-10-17 MED ORDER — CYANOCOBALAMIN 1000 MCG/ML IJ SOLN
1000.0000 ug | Freq: Once | INTRAMUSCULAR | Status: AC
  Administered 2019-10-17: 1000 ug via INTRAMUSCULAR

## 2019-10-17 MED ORDER — SODIUM CHLORIDE 0.9 % IV SOLN
800.0000 mg | Freq: Once | INTRAVENOUS | Status: AC
  Administered 2019-10-17: 800 mg via INTRAVENOUS
  Filled 2019-10-17: qty 20

## 2019-10-17 NOTE — Progress Notes (Addendum)
[facility] OFFICE PROGRESS NOTE   Diagnosis: Lung cancer  INTERVAL HISTORY:   Ms. Kinderman returns for follow-up.  She completed a cycle of Alimta 09/22/2019.  She denies nausea/vomiting.  No mouth sores.  She has occasional diarrhea which is diet related.  No rash.  She denies fever, cough, shortness of breath.  Objective:  Vital signs in last 24 hours:  Blood pressure (!) 146/80, pulse 86, temperature 98.5 F (36.9 C), temperature source Temporal, resp. rate 17, height _0  (1.626 m), weight 114 lb 6.4 oz (51.9 kg), SpO2 100 %.    HEENT: No thrush or ulcers. GI: Abdomen soft and nontender.  No hepatomegaly. Vascular: No leg edema. Neuro: Alert and oriented. Skin: Skin is dry.  No rash.   Lab Results:  Lab Results  Component Value Date   WBC 7.9 09/22/2019   HGB 7.6 (L) 09/22/2019   HCT 24.5 (L) 09/22/2019   MCV 93.9 09/22/2019   PLT 412 (H) 09/22/2019   NEUTROABS 5.3 09/22/2019    Imaging:  No results found.  Medications: I have reviewed the patient's current medications.  Assessment/Plan: 1.Metastatic non-small cell lung cancer   CTabdomen/pelvis 06/12/2019-scattered soft tissue masses in left hepatic lobe, right kidney, and the right adrenal gland. Fullness of the left hilum suspicious for left hilar mass and small nodules at the right lung base. Also noted signs of right pyelonephritis.  CT chest 06/13/2019-large encasing soft tissue mass left hilum measuring at least 5.8 x 4.5 x 3.8 cm with narrowing of the left mainstem bronchus and left pulmonary artery. Near-total obstructive atelectasis of the left upper lobe with a portion of the lingula remaining aerated. Multiple bilateral pulmonary nodules.  Biopsy left supraclavicular lymph node 06/15/2019-metastatic lung adenocarcinoma; immunohistochemistry positive for cytokeratin 7, TTF-1, PAX 8 (weak). Napsin-A and CDX-2negative.Foundation 1-microsatellite stable, tumor mutational burden  15, ATM alteration, KRAS G13D;tumor proportion score 100%  Brain CT 06/29/2019-negative for metastatic disease  Cycle 1 carboplatin/Alimta/pembrolizumab 06/29/2019  Cycle 2 carboplatin/Alimta/pembrolizumab 07/20/2019  Cycle 3 carboplatin/Alimta 08/10/2019, pembrolizumab held due to rash  Cycle 4 carboplatin/Alimta, Udenyca added, pembrolizumab held 08/31/2019  Cycle 5 Alimta alone (carboplatin discontinued, pembrolizumab held) 09/22/2019  CTs 10/06/2019-decrease in size of central left upper lobe pulmonary mass; bilateral pulmonary nodules of varying size some of which are irregular in shape; many of these are stable in the interval.  Some of the right lung nodules have decreased/resolved since the prior study.  Some new irregular nodules in the left lower lobe indeterminate but potentially infectious/inflammatory.  Marked interval decrease in size of left hepatic and right renal metastases.  Metastatic retroperitoneal lymphadenopathy in the abdomen has clearly decreased.  Metastatic nodules in the right omentum and perirenal fat bilaterally have decreased/resolved.  No new or progressive findings.  Cycle 6 Alimta/pembrolizumab 10/17/2019 2. Anemia 3.Urinary tract infection 4. 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 Nausea 6. Constipation 7.Right abdominal pain secondary to #1 8.Skin rashbilateral forearms, grade 2, likely related to pembrolizumab7/22/2020; grade 3 08/03/2019, prednisone initiated; rash improved 08/10/2019, prednisone decreased to 10 mg daily x7 days then discontinued  Disposition: Ms. Munshi appears stable.  She has completed a total of 5 cycles of systemic therapy.  Recent restaging CTs show improvement.  Dr. Benay Spice recommends continuation of systemic therapy with Alimta plus pembrolizumab.  Pembrolizumab has been on hold due to a skin rash.  The rash has completely resolved.  She understands the potential for the rash to recur and agrees to proceed.  She  will  resume prednisone at a dose of 10 mg daily.  She will return for lab, follow-up, Alimta and pembrolizumab in 3 weeks.  She will contact the office in the interim with any problems.  We specifically discussed skin rash.  Patient seen with Dr. Benay Spice.  25 minutes were spent face-to-face at today's visit with the majority of that time involved in counseling/coordination of care.    Ned Card ANP/GNP-BC   10/17/2019  3:36 PM  This was a shared visit with Ned Card.  Ms. Ivancic has experienced significant clinical and radiologic improvement with systemic therapy.  She developed a rash following 2 cycles of pembrolizumab.  I suspect is a significant contributor to the tumor response.  We discussed pembrolizumab.  She understands the risk of developing a recurrent rash.  She agrees to proceed.  The plan is to continue Alimta and pembrolizumab. I reviewed the CT images. [name], MD

## 2019-10-17 NOTE — Patient Instructions (Addendum)
[facility] Pembrolizumab injection What is this medicine? PEMBROLIZUMAB (pem broe liz ue mab) is a monoclonal antibody. It is used to treat bladder cancer, cervical cancer, endometrial cancer, esophageal cancer, head and neck cancer, hepatocellular cancer, Hodgkin lymphoma, kidney cancer, lymphoma, melanoma, Merkel cell carcinoma, lung cancer, stomach cancer, urothelial cancer, and cancers that have a certain genetic condition. This medicine may be used for other purposes; ask your health care provider or pharmacist if you have questions. COMMON BRAND NAME(S): Keytruda What should I tell my health care provider before I take this medicine? They need to know if you have any of these conditions:  diabetes  immune system problems  inflammatory bowel disease  liver disease  lung or breathing disease  lupus  received or scheduled to receive an organ transplant or a stem-cell transplant that uses donor stem cells  an unusual or allergic reaction to pembrolizumab, other medicines, foods, dyes, or preservatives  pregnant or trying to get pregnant  breast-feeding How should I use this medicine? This medicine is for infusion into a vein. It is given by a health care professional in a hospital or clinic setting. A special MedGuide will be given to you before each treatment. Be sure to read this information carefully each time. Talk to your pediatrician regarding the use of this medicine in children. While this drug may be prescribed for selected conditions, precautions do apply. Overdosage: If you think you have taken too much of this medicine contact a poison control center or emergency room at once. NOTE: This medicine is only for you. Do not share this medicine with others. What if I miss a dose? It is important not to miss your dose. Call your doctor or health care professional if you are unable to keep an appointment. What may interact with this medicine? Interactions have not been  studied. Give your health care provider a list of all the medicines, herbs, non-prescription drugs, or dietary supplements you use. Also tell them if you smoke, drink alcohol, or use illegal drugs. Some items may interact with your medicine. This list may not describe all possible interactions. Give your health care provider a list of all the medicines, herbs, non-prescription drugs, or dietary supplements you use. Also tell them if you smoke, drink alcohol, or use illegal drugs. Some items may interact with your medicine. What should I watch for while using this medicine? Your condition will be monitored carefully while you are receiving this medicine. You may need blood work done while you are taking this medicine. Do not become pregnant while taking this medicine or for 4 months after stopping it. Women should inform their doctor if they wish to become pregnant or think they might be pregnant. There is a potential for serious side effects to an unborn child. Talk to your health care professional or pharmacist for more information. Do not breast-feed an infant while taking this medicine or for 4 months after the last dose. What side effects may I notice from receiving this medicine? Side effects that you should report to your doctor or health care professional as soon as possible:  allergic reactions like skin rash, itching or hives, swelling of the face, lips, or tongue  bloody or black, tarry  breathing problems  changes in vision  chest pain  chills  confusion  constipation  cough  diarrhea  dizziness or feeling faint or lightheaded  fast or irregular heartbeat  fever  flushing  hair loss  joint pain  low blood counts -  this medicine may decrease the number of white blood cells, red blood cells and platelets. You may be at increased risk for infections and bleeding.  muscle pain  muscle weakness  persistent headache  redness, blistering, peeling or loosening of  the skin, including inside the mouth  signs and symptoms of high blood sugar such as dizziness; dry mouth; dry skin; fruity breath; nausea; stomach pain; increased hunger or thirst; increased urination  signs and symptoms of kidney injury like trouble passing urine or change in the amount of urine  signs and symptoms of liver injury like dark urine, light-colored stools, loss of appetite, nausea, right upper belly pain, yellowing of the eyes or skin  sweating  swollen lymph nodes  weight loss Side effects that usually do not require medical attention (report to your doctor or health care professional if they continue or are bothersome):  decreased appetite  muscle pain  tiredness This list may not describe all possible side effects. Call your doctor for medical advice about side effects. You may report side effects to FDA at 1-800-FDA-1088. Where should I keep my medicine? This drug is given in a hospital or clinic and will not be stored at home. NOTE: This sheet is a summary. It may not cover all possible information. If you have questions about this medicine, talk to your doctor, pharmacist, or health care provider.  2020 Elsevier/Gold Standard (2019-01-11 13:46:58)  Pemetrexed injection What is this medicine? PEMETREXED (PEM e TREX ed) is a chemotherapy drug used to treat lung cancers like non-small cell lung cancer and mesothelioma. It may also be used to treat other cancers. This medicine may be used for other purposes; ask your health care provider or pharmacist if you have questions. COMMON BRAND NAME(S): Alimta What should I tell my health care provider before I take this medicine? They need to know if you have any of these conditions:  infection (especially a virus infection such as chickenpox, cold sores, or herpes)  kidney disease  low blood counts, like low white cell, platelet, or red cell counts  lung or breathing disease, like asthma  radiation therapy  an  unusual or allergic reaction to pemetrexed, other medicines, foods, dyes, or preservative  pregnant or trying to get pregnant  breast-feeding How should I use this medicine? This drug is given as an infusion into a vein. It is administered in a hospital or clinic by a specially trained health care professional. Talk to your pediatrician regarding the use of this medicine in children. Special care may be needed. Overdosage: If you think you have taken too much of this medicine contact a poison control center or emergency room at once. NOTE: This medicine is only for you. Do not share this medicine with others. What if I miss a dose? It is important not to miss your dose. Call your doctor or health care professional if you are unable to keep an appointment. What may interact with this medicine? This medicine may interact with the following medications:  Ibuprofen This list may not describe all possible interactions. Give your health care provider a list of all the medicines, herbs, non-prescription drugs, or dietary supplements you use. Also tell them if you smoke, drink alcohol, or use illegal drugs. Some items may interact with your medicine. What should I watch for while using this medicine? Visit your doctor for checks on your progress. This drug may make you feel generally unwell. This is not uncommon, as chemotherapy can affect healthy cells as  well as cancer cells. Report any side effects. Continue your course of treatment even though you feel ill unless your doctor tells you to stop. In some cases, you may be given additional medicines to help with side effects. Follow all directions for their use. Call your doctor or health care professional for advice if you get a fever, chills or sore throat, or other symptoms of a cold or flu. Do not treat yourself. This drug decreases your body's ability to fight infections. Try to avoid being around people who are sick. This medicine may increase  your risk to bruise or bleed. Call your doctor or health care professional if you notice any unusual bleeding. Be careful brushing and flossing your teeth or using a toothpick because you may get an infection or bleed more easily. If you have any dental work done, tell your dentist you are receiving this medicine. Avoid taking products that contain aspirin, acetaminophen, ibuprofen, naproxen, or ketoprofen unless instructed by your doctor. These medicines may hide a fever. Call your doctor or health care professional if you get diarrhea or mouth sores. Do not treat yourself. To protect your kidneys, drink water or other fluids as directed while you are taking this medicine. Do not become pregnant while taking this medicine or for 6 months after stopping it. Women should inform their doctor if they wish to become pregnant or think they might be pregnant. Men should not father a child while taking this medicine and for 3 months after stopping it. This may interfere with the ability to father a child. You should talk to your doctor or health care professional if you are concerned about your fertility. There is a potential for serious side effects to an unborn child. Talk to your health care professional or pharmacist for more information. Do not breast-feed an infant while taking this medicine or for 1 week after stopping it. What side effects may I notice from receiving this medicine? Side effects that you should report to your doctor or health care professional as soon as possible:  allergic reactions like skin rash, itching or hives, swelling of the face, lips, or tongue  breathing problems  redness, blistering, peeling or loosening of the skin, including inside the mouth  signs and symptoms of bleeding such as bloody or black, tarry stools; red or dark-brown urine; spitting up blood or brown material that looks like coffee grounds; red spots on the skin; unusual bruising or bleeding from the eye,  gums, or nose  signs and symptoms of infection like fever or chills; cough; sore throat; pain or trouble passing urine  signs and symptoms of kidney injury like trouble passing urine or change in the amount of urine  signs and symptoms of liver injury like dark yellow or brown urine; general ill feeling or flu-like symptoms; light-colored stools; loss of appetite; nausea; right upper belly pain; unusually weak or tired; yellowing of the eyes or skin Side effects that usually do not require medical attention (report to your doctor or health care professional if they continue or are bothersome):  constipation  mouth sores  nausea, vomiting  unusually weak or tired This list may not describe all possible side effects. Call your doctor for medical advice about side effects. You may report side effects to FDA at 1-800-FDA-1088. Where should I keep my medicine? This drug is given in a hospital or clinic and will not be stored at home. NOTE: This sheet is a summary. It may not cover all possible  information. If you have questions about this medicine, talk to your doctor, pharmacist, or health care provider.  2020 Elsevier/Gold Standard (2018-02-03 16:11:33)

## 2019-10-18 LAB — TSH: TSH: 3.348 u[IU]/mL (ref 0.308–3.960)

## 2019-10-20 ENCOUNTER — Telehealth: Payer: Self-pay | Admitting: Oncology

## 2019-10-20 NOTE — Telephone Encounter (Signed)
Confirmed with patient 11/10 appointments. Appointments moved from 11/9 to 11/10 per patient cannot do early AM 11/9.

## 2019-10-20 NOTE — Telephone Encounter (Signed)
Treatment q3w per care plan, next due 11/9.

## 2019-11-04 ENCOUNTER — Other Ambulatory Visit: Payer: Self-pay | Admitting: Oncology

## 2019-11-04 MED FILL — FOLIC ACID 1 MG TABS: 1 | 30 days supply | Qty: 30 | Fill #3

## 2019-11-07 ENCOUNTER — Other Ambulatory Visit: Payer: Self-pay | Admitting: *Deleted

## 2019-11-07 ENCOUNTER — Ambulatory Visit: Payer: Medicaid Other | Admitting: Oncology

## 2019-11-07 ENCOUNTER — Other Ambulatory Visit: Payer: Medicaid Other

## 2019-11-07 ENCOUNTER — Ambulatory Visit: Payer: Medicaid Other

## 2019-11-07 DIAGNOSIS — C3492 Malignant neoplasm of unspecified part of left bronchus or lung: Secondary | ICD-10-CM

## 2019-11-08 ENCOUNTER — Inpatient Hospital Stay: Payer: Medicaid Other | Attending: Nurse Practitioner

## 2019-11-08 ENCOUNTER — Inpatient Hospital Stay: Payer: Medicaid Other

## 2019-11-08 ENCOUNTER — Inpatient Hospital Stay (HOSPITAL_BASED_OUTPATIENT_CLINIC_OR_DEPARTMENT_OTHER): Payer: Medicaid Other | Admitting: Oncology

## 2019-11-08 ENCOUNTER — Other Ambulatory Visit: Payer: Self-pay

## 2019-11-08 VITALS — BP 156/82 | HR 91 | Temp 98.5°F | Resp 17 | Ht 64.0 in | Wt 115.2 lb

## 2019-11-08 DIAGNOSIS — C770 Secondary and unspecified malignant neoplasm of lymph nodes of head, face and neck: Secondary | ICD-10-CM | POA: Diagnosis not present

## 2019-11-08 DIAGNOSIS — C7902 Secondary malignant neoplasm of left kidney and renal pelvis: Secondary | ICD-10-CM | POA: Diagnosis not present

## 2019-11-08 DIAGNOSIS — D649 Anemia, unspecified: Secondary | ICD-10-CM | POA: Diagnosis not present

## 2019-11-08 DIAGNOSIS — C3492 Malignant neoplasm of unspecified part of left bronchus or lung: Secondary | ICD-10-CM

## 2019-11-08 DIAGNOSIS — Z5111 Encounter for antineoplastic chemotherapy: Secondary | ICD-10-CM | POA: Diagnosis present

## 2019-11-08 DIAGNOSIS — Z95828 Presence of other vascular implants and grafts: Secondary | ICD-10-CM | POA: Insufficient documentation

## 2019-11-08 DIAGNOSIS — R590 Localized enlarged lymph nodes: Secondary | ICD-10-CM | POA: Diagnosis not present

## 2019-11-08 DIAGNOSIS — K5900 Constipation, unspecified: Secondary | ICD-10-CM | POA: Diagnosis not present

## 2019-11-08 DIAGNOSIS — R110 Nausea: Secondary | ICD-10-CM | POA: Diagnosis not present

## 2019-11-08 DIAGNOSIS — C3412 Malignant neoplasm of upper lobe, left bronchus or lung: Secondary | ICD-10-CM | POA: Insufficient documentation

## 2019-11-08 DIAGNOSIS — N390 Urinary tract infection, site not specified: Secondary | ICD-10-CM | POA: Insufficient documentation

## 2019-11-08 DIAGNOSIS — Z79899 Other long term (current) drug therapy: Secondary | ICD-10-CM | POA: Diagnosis not present

## 2019-11-08 LAB — CBC WITH DIFFERENTIAL (CANCER CENTER ONLY)
Abs Immature Granulocytes: 0.01 10*3/uL (ref 0.00–0.07)
Basophils Absolute: 0.1 10*3/uL (ref 0.0–0.1)
Basophils Relative: 1 %
Eosinophils Absolute: 0.3 10*3/uL (ref 0.0–0.5)
Eosinophils Relative: 5 %
HCT: 32.6 % — ABNORMAL LOW (ref 36.0–46.0)
Hemoglobin: 10.2 g/dL — ABNORMAL LOW (ref 12.0–15.0)
Immature Granulocytes: 0 %
Lymphocytes Relative: 33 %
Lymphs Abs: 1.6 10*3/uL (ref 0.7–4.0)
MCH: 33.2 pg (ref 26.0–34.0)
MCHC: 31.3 g/dL (ref 30.0–36.0)
MCV: 106.2 fL — ABNORMAL HIGH (ref 80.0–100.0)
Monocytes Absolute: 0.5 10*3/uL (ref 0.1–1.0)
Monocytes Relative: 10 %
Neutro Abs: 2.4 10*3/uL (ref 1.7–7.7)
Neutrophils Relative %: 51 %
Platelet Count: 270 10*3/uL (ref 150–400)
RBC: 3.07 MIL/uL — ABNORMAL LOW (ref 3.87–5.11)
RDW: 13.6 % (ref 11.5–15.5)
WBC Count: 4.8 10*3/uL (ref 4.0–10.5)
nRBC: 0 % (ref 0.0–0.2)

## 2019-11-08 LAB — CMP (CANCER CENTER ONLY)
ALT: 16 U/L (ref 0–44)
AST: 20 U/L (ref 15–41)
Albumin: 4.1 g/dL (ref 3.5–5.0)
Alkaline Phosphatase: 112 U/L (ref 38–126)
Anion gap: 11 (ref 5–15)
BUN: 12 mg/dL (ref 6–20)
CO2: 23 mmol/L (ref 22–32)
Calcium: 10.2 mg/dL (ref 8.9–10.3)
Chloride: 107 mmol/L (ref 98–111)
Creatinine: 1.22 mg/dL — ABNORMAL HIGH (ref 0.44–1.00)
GFR, Est AFR Am: 56 mL/min — ABNORMAL LOW (ref >60)
GFR, Estimated: 48 mL/min — ABNORMAL LOW (ref >60)
Glucose, Bld: 97 mg/dL (ref 70–99)
Potassium: 4.2 mmol/L (ref 3.5–5.1)
Sodium: 141 mmol/L (ref 135–145)
Total Bilirubin: 0.3 mg/dL (ref 0.3–1.2)
Total Protein: 7.5 g/dL (ref 6.5–8.1)

## 2019-11-08 MED ORDER — SODIUM CHLORIDE 0.9 % IV SOLN
510.0000 mg/m2 | Freq: Once | INTRAVENOUS | Status: AC
  Administered 2019-11-08: 800 mg via INTRAVENOUS
  Filled 2019-11-08: qty 20

## 2019-11-08 MED ORDER — SODIUM CHLORIDE 0.9 % IV SOLN
Freq: Once | INTRAVENOUS | Status: AC
  Administered 2019-11-08: 13:00:00 via INTRAVENOUS
  Filled 2019-11-08: qty 250

## 2019-11-08 MED ORDER — PROCHLORPERAZINE MALEATE 10 MG PO TABS
ORAL_TABLET | ORAL | Status: AC
  Filled 2019-11-08: qty 1

## 2019-11-08 MED ORDER — SODIUM CHLORIDE 0.9 % IV SOLN
200.0000 mg | Freq: Once | INTRAVENOUS | Status: AC
  Administered 2019-11-08: 200 mg via INTRAVENOUS
  Filled 2019-11-08: qty 8

## 2019-11-08 MED ORDER — PROCHLORPERAZINE MALEATE 10 MG PO TABS
10.0000 mg | ORAL_TABLET | Freq: Once | ORAL | Status: AC
  Administered 2019-11-08: 10 mg via ORAL

## 2019-11-08 MED ORDER — SODIUM CHLORIDE 0.9% FLUSH
10.0000 mL | INTRAVENOUS | Status: AC | PRN
  Filled 2019-11-08: qty 10

## 2019-11-08 NOTE — Progress Notes (Signed)
[facility] OFFICE PROGRESS NOTE   Diagnosis: Non-small cell lung cancer  INTERVAL HISTORY:   [name] returns as scheduled.  She completed another treatment with Alimta/pembrolizumab on 10/17/2019.  No nausea.  She had a few episodes of diarrhea.  The skin rash did not return.  She took a 10 mg dose of prednisone once on the day of chemotherapy.  She did not take anymore prednisone.  Objective:  Vital signs in last 24 hours:  Blood pressure (!) 156/82, pulse 91, temperature 98.5 F (36.9 C), temperature source Temporal, resp. rate 17, height 5' 4" (1.626 m), weight 115 lb 3.2 oz (52.3 kg), SpO2 100 %.   GI: No hepatosplenomegaly, no mass, nontender Vascular: No leg edema  Skin: Dry skin at the upper back, no rash over the trunk or extremities    Lab Results:  Lab Results  Component Value Date   WBC 4.8 11/08/2019   HGB 10.2 (L) 11/08/2019   HCT 32.6 (L) 11/08/2019   MCV 106.2 (H) 11/08/2019   PLT 270 11/08/2019   NEUTROABS 2.4 11/08/2019    CMP  Lab Results  Component Value Date   NA 141 11/08/2019   K 4.2 11/08/2019   CL 107 11/08/2019   CO2 23 11/08/2019   GLUCOSE 97 11/08/2019   BUN 12 11/08/2019   CREATININE 1.22 (H) 11/08/2019   CALCIUM 10.2 11/08/2019   PROT 7.5 11/08/2019   ALBUMIN 4.1 11/08/2019   AST 20 11/08/2019   ALT 16 11/08/2019   ALKPHOS 112 11/08/2019   BILITOT 0.3 11/08/2019   GFRNONAA 48 (L) 11/08/2019   GFRAA 56 (L) 11/08/2019     Medications: I have reviewed the patient's current medications.   Assessment/Plan: 1.Metastatic non-small cell lung cancer   CTabdomen/pelvis 06/12/2019-scattered soft tissue masses in left hepatic lobe, right kidney, and the right adrenal gland. Fullness of the left hilum suspicious for left hilar mass and small nodules at the right lung base. Also noted signs of right pyelonephritis.  CT chest 06/13/2019-large encasing soft tissue mass left hilum measuring at least 5.8 x 4.5 x 3.8  cm with narrowing of the left mainstem bronchus and left pulmonary artery. Near-total obstructive atelectasis of the left upper lobe with a portion of the lingula remaining aerated. Multiple bilateral pulmonary nodules.  Biopsy left supraclavicular lymph node 06/15/2019-metastatic lung adenocarcinoma; immunohistochemistry positive for cytokeratin 7, TTF-1, PAX 8 (weak). Napsin-A and CDX-2negative.Foundation 1-microsatellite stable, tumor mutational burden 15, ATM alteration, KRAS G13D;tumor proportion score 100%  Brain CT 06/29/2019-negative for metastatic disease  Cycle 1 carboplatin/Alimta/pembrolizumab 06/29/2019  Cycle 2 carboplatin/Alimta/pembrolizumab 07/20/2019  Cycle 3 carboplatin/Alimta 08/10/2019, pembrolizumab held due to rash  Cycle 4 carboplatin/Alimta, Udenyca added, pembrolizumab held 08/31/2019  Cycle 5 Alimta alone (carboplatin discontinued, pembrolizumab held) 09/22/2019  CTs 10/06/2019-decrease in size of central left upper lobe pulmonary mass; bilateral pulmonary nodules of varying size some of which are irregular in shape; many of these are stable in the interval.  Some of the right lung nodules have decreased/resolved since the prior study.  Some new irregular nodules in the left lower lobe indeterminate but potentially infectious/inflammatory.  Marked interval decrease in size of left hepatic and right renal metastases.  Metastatic retroperitoneal lymphadenopathy in the abdomen has clearly decreased.  Metastatic nodules in the right omentum and perirenal fat bilaterally have decreased/resolved.  No new or progressive findings.  Cycle 6 Alimta/pembrolizumab 10/17/2019  Cycle 7 Alimta/pembrolizumab 11/08/2019 2. Anemia 3.Urinary tract infection 4. 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  Nausea 6. Constipation 7.Right abdominal pain secondary to #1 8.Skin rashbilateral forearms, grade 2, likely related to pembrolizumab7/22/2020; grade 3 08/03/2019,  prednisone initiated; rash improved 08/10/2019, prednisone decreased to 10 mg daily x7 days then discontinued    Disposition: [name] appears unchanged.  She did not develop a rash after the last cycle of pembrolizumab.  She will complete another cycle of Alimta/pembrolizumab today.  The creatinine is mildly elevated, but the creatinine clearance appears adequate to proceed with chemotherapy.  She will return for an office visit and chemotherapy in 3 weeks.  She will take 1 dose of prednisone, 10 mg, after chemotherapy today.  Betsy Coder, MD  11/08/2019  12:41 PM

## 2019-11-08 NOTE — Patient Instructions (Signed)
[facility] Discharge Instructions for Patients Receiving Chemotherapy  Today you received the following chemotherapy agents: Pemetrexed (Alimta), and Carboplatin (Paraplatin)  To help prevent nausea and vomiting after your treatment, we encourage you to take your nausea medication as directed.   If you develop nausea and vomiting that is not controlled by your nausea medication, call the clinic.   BELOW ARE SYMPTOMS THAT SHOULD BE REPORTED IMMEDIATELY:  *FEVER GREATER THAN 100.5 F  *CHILLS WITH OR WITHOUT FEVER  NAUSEA AND VOMITING THAT IS NOT CONTROLLED WITH YOUR NAUSEA MEDICATION  *UNUSUAL SHORTNESS OF BREATH  *UNUSUAL BRUISING OR BLEEDING  TENDERNESS IN MOUTH AND THROAT WITH OR WITHOUT PRESENCE OF ULCERS  *URINARY PROBLEMS  *BOWEL PROBLEMS  UNUSUAL RASH Items with * indicate a potential emergency and should be followed up as soon as possible.  Feel free to call the clinic should you have any questions or concerns. The clinic phone number is [phone].  Please show the [place] at check-in to the Emergency Department and triage nurse.  Coronavirus (COVID-19) Are you at risk?  Are you at risk for the Coronavirus (COVID-19)?  To be considered HIGH RISK for Coronavirus (COVID-19), you have to meet the following criteria:  . Traveled to [place], [place], [place], [place] or [place]; or in the [place] to [place], [place], [place], or [place]; and have fever, cough, and shortness of breath within the last 2 weeks of travel OR . Been in close contact with a person diagnosed with COVID-19 within the last 2 weeks and have fever, cough, and shortness of breath . IF YOU DO NOT MEET THESE CRITERIA, YOU ARE CONSIDERED LOW RISK FOR COVID-19.  What to do if you are HIGH RISK for COVID-19?  Marland Kitchen If you are having a medical emergency, call 911. . Seek medical care right away. Before you go to a doctor's office, urgent care or emergency department,  call ahead and tell them about your recent travel, contact with someone diagnosed with COVID-19, and your symptoms. You should receive instructions from your physician's office regarding next steps of care.  . When you arrive at healthcare provider, tell the healthcare staff immediately you have returned from visiting [place], [place], [place], [place] or [place]; or traveled in the [place] to [place], [place], [place], or [place]; in the last two weeks or you have been in close contact with a person diagnosed with COVID-19 in the last 2 weeks.   . Tell the health care staff about your symptoms: fever, cough and shortness of breath. . After you have been seen by a medical provider, you will be either: o Tested for (COVID-19) and discharged home on quarantine except to seek medical care if symptoms worsen, and asked to  - Stay home and avoid contact with others until you get your results (4-5 days)  - Avoid travel on public transportation if possible (such as bus, train, or airplane) or o Sent to the Emergency Department by EMS for evaluation, COVID-19 testing, and possible admission depending on your condition and test results.  What to do if you are LOW RISK for COVID-19?  Reduce your risk of any infection by using the same precautions used for avoiding the common cold or flu:  Marland Kitchen Wash your hands often with soap and warm water for at least 20 seconds.  If soap and water are not readily available, use an alcohol-based hand sanitizer with at least 60% alcohol.  Marland Kitchen  If coughing or sneezing, cover your mouth and nose by coughing or sneezing into the elbow areas of your shirt or coat, into a tissue or into your sleeve (not your hands). . Avoid shaking hands with others and consider head nods or verbal greetings only. . Avoid touching your eyes, nose, or mouth with unwashed hands.  . Avoid close contact with people who are sick. . Avoid places or events with large numbers of people in one  location, like concerts or sporting events. . Carefully consider travel plans you have or are making. . If you are planning any travel outside or inside the [place], visit the CDC's Travelers' Health webpage for the latest health notices. . If you have some symptoms but not all symptoms, continue to monitor at home and seek medical attention if your symptoms worsen. . If you are having a medical emergency, call 911.   [place] / e-Visit: [email or web address]         [facility]: [facility]: [phone]                   [facility]: [phone]

## 2019-11-27 ENCOUNTER — Other Ambulatory Visit: Payer: Self-pay | Admitting: Oncology

## 2019-11-28 ENCOUNTER — Inpatient Hospital Stay: Payer: Medicaid Other

## 2019-11-28 ENCOUNTER — Inpatient Hospital Stay (HOSPITAL_BASED_OUTPATIENT_CLINIC_OR_DEPARTMENT_OTHER): Payer: Medicaid Other | Admitting: Nurse Practitioner

## 2019-11-28 ENCOUNTER — Other Ambulatory Visit: Payer: Self-pay

## 2019-11-28 ENCOUNTER — Encounter: Payer: Self-pay | Admitting: Nurse Practitioner

## 2019-11-28 VITALS — BP 138/97 | HR 93 | Temp 98.3°F | Resp 17 | Ht 64.0 in | Wt 118.4 lb

## 2019-11-28 VITALS — BP 161/77

## 2019-11-28 DIAGNOSIS — C3492 Malignant neoplasm of unspecified part of left bronchus or lung: Secondary | ICD-10-CM

## 2019-11-28 DIAGNOSIS — Z5111 Encounter for antineoplastic chemotherapy: Secondary | ICD-10-CM | POA: Diagnosis not present

## 2019-11-28 LAB — CBC WITH DIFFERENTIAL (CANCER CENTER ONLY)
Abs Immature Granulocytes: 0.01 10*3/uL (ref 0.00–0.07)
Basophils Absolute: 0.1 10*3/uL (ref 0.0–0.1)
Basophils Relative: 1 %
Eosinophils Absolute: 0.3 10*3/uL (ref 0.0–0.5)
Eosinophils Relative: 5 %
HCT: 31.3 % — ABNORMAL LOW (ref 36.0–46.0)
Hemoglobin: 10.1 g/dL — ABNORMAL LOW (ref 12.0–15.0)
Immature Granulocytes: 0 %
Lymphocytes Relative: 30 %
Lymphs Abs: 1.5 10*3/uL (ref 0.7–4.0)
MCH: 33.3 pg (ref 26.0–34.0)
MCHC: 32.3 g/dL (ref 30.0–36.0)
MCV: 103.3 fL — ABNORMAL HIGH (ref 80.0–100.0)
Monocytes Absolute: 0.7 10*3/uL (ref 0.1–1.0)
Monocytes Relative: 14 %
Neutro Abs: 2.5 10*3/uL (ref 1.7–7.7)
Neutrophils Relative %: 50 %
Platelet Count: 327 10*3/uL (ref 150–400)
RBC: 3.03 MIL/uL — ABNORMAL LOW (ref 3.87–5.11)
RDW: 13.6 % (ref 11.5–15.5)
WBC Count: 4.9 10*3/uL (ref 4.0–10.5)
nRBC: 0 % (ref 0.0–0.2)

## 2019-11-28 LAB — CMP (CANCER CENTER ONLY)
ALT: 24 U/L (ref 0–44)
AST: 21 U/L (ref 15–41)
Albumin: 3.7 g/dL (ref 3.5–5.0)
Alkaline Phosphatase: 112 U/L (ref 38–126)
Anion gap: 12 (ref 5–15)
BUN: 13 mg/dL (ref 6–20)
CO2: 21 mmol/L — ABNORMAL LOW (ref 22–32)
Calcium: 10.1 mg/dL (ref 8.9–10.3)
Chloride: 108 mmol/L (ref 98–111)
Creatinine: 1.24 mg/dL — ABNORMAL HIGH (ref 0.44–1.00)
GFR, Est AFR Am: 55 mL/min — ABNORMAL LOW (ref >60)
GFR, Estimated: 48 mL/min — ABNORMAL LOW (ref >60)
Glucose, Bld: 124 mg/dL — ABNORMAL HIGH (ref 70–99)
Potassium: 4 mmol/L (ref 3.5–5.1)
Sodium: 141 mmol/L (ref 135–145)
Total Bilirubin: 0.3 mg/dL (ref 0.3–1.2)
Total Protein: 7.2 g/dL (ref 6.5–8.1)

## 2019-11-28 MED ORDER — PROCHLORPERAZINE MALEATE 10 MG PO TABS
ORAL_TABLET | ORAL | Status: AC
  Filled 2019-11-28: qty 1

## 2019-11-28 MED ORDER — SODIUM CHLORIDE 0.9 % IV SOLN
Freq: Once | INTRAVENOUS | Status: AC
  Administered 2019-11-28: 14:00:00 via INTRAVENOUS
  Filled 2019-11-28: qty 250

## 2019-11-28 MED ORDER — SODIUM CHLORIDE 0.9 % IV SOLN
200.0000 mg | Freq: Once | INTRAVENOUS | Status: AC
  Administered 2019-11-28: 200 mg via INTRAVENOUS
  Filled 2019-11-28: qty 8

## 2019-11-28 MED ORDER — SODIUM CHLORIDE 0.9 % IV SOLN
510.0000 mg/m2 | Freq: Once | INTRAVENOUS | Status: AC
  Administered 2019-11-28: 800 mg via INTRAVENOUS
  Filled 2019-11-28: qty 20

## 2019-11-28 MED ORDER — PROCHLORPERAZINE MALEATE 10 MG PO TABS
10.0000 mg | ORAL_TABLET | Freq: Once | ORAL | Status: AC
  Administered 2019-11-28: 10 mg via ORAL

## 2019-11-28 NOTE — Patient Instructions (Signed)
[facility] Discharge Instructions for Patients Receiving Chemotherapy  Today you received the following chemotherapy agents: Pemetrexed (Alimta), and Carboplatin (Paraplatin)  To help prevent nausea and vomiting after your treatment, we encourage you to take your nausea medication as directed.   If you develop nausea and vomiting that is not controlled by your nausea medication, call the clinic.   BELOW ARE SYMPTOMS THAT SHOULD BE REPORTED IMMEDIATELY:  *FEVER GREATER THAN 100.5 F  *CHILLS WITH OR WITHOUT FEVER  NAUSEA AND VOMITING THAT IS NOT CONTROLLED WITH YOUR NAUSEA MEDICATION  *UNUSUAL SHORTNESS OF BREATH  *UNUSUAL BRUISING OR BLEEDING  TENDERNESS IN MOUTH AND THROAT WITH OR WITHOUT PRESENCE OF ULCERS  *URINARY PROBLEMS  *BOWEL PROBLEMS  UNUSUAL RASH Items with * indicate a potential emergency and should be followed up as soon as possible.  Feel free to call the clinic should you have any questions or concerns. The clinic phone number is [phone].  Please show the [place] at check-in to the Emergency Department and triage nurse.  Coronavirus (COVID-19) Are you at risk?  Are you at risk for the Coronavirus (COVID-19)?  To be considered HIGH RISK for Coronavirus (COVID-19), you have to meet the following criteria:  . Traveled to [place], [place], [place], [place] or [place]; or in the [place] to [place], [place], [place], or [place]; and have fever, cough, and shortness of breath within the last 2 weeks of travel OR . Been in close contact with a person diagnosed with COVID-19 within the last 2 weeks and have fever, cough, and shortness of breath . IF YOU DO NOT MEET THESE CRITERIA, YOU ARE CONSIDERED LOW RISK FOR COVID-19.  What to do if you are HIGH RISK for COVID-19?  Marland Kitchen If you are having a medical emergency, call 911. . Seek medical care right away. Before you go to a doctor's office, urgent care or emergency department,  call ahead and tell them about your recent travel, contact with someone diagnosed with COVID-19, and your symptoms. You should receive instructions from your physician's office regarding next steps of care.  . When you arrive at healthcare provider, tell the healthcare staff immediately you have returned from visiting [place], [place], [place], [place] or [place]; or traveled in the [place] to [place], [place], [place], or [place]; in the last two weeks or you have been in close contact with a person diagnosed with COVID-19 in the last 2 weeks.   . Tell the health care staff about your symptoms: fever, cough and shortness of breath. . After you have been seen by a medical provider, you will be either: o Tested for (COVID-19) and discharged home on quarantine except to seek medical care if symptoms worsen, and asked to  - Stay home and avoid contact with others until you get your results (4-5 days)  - Avoid travel on public transportation if possible (such as bus, train, or airplane) or o Sent to the Emergency Department by EMS for evaluation, COVID-19 testing, and possible admission depending on your condition and test results.  What to do if you are LOW RISK for COVID-19?  Reduce your risk of any infection by using the same precautions used for avoiding the common cold or flu:  Marland Kitchen Wash your hands often with soap and warm water for at least 20 seconds.  If soap and water are not readily available, use an alcohol-based hand sanitizer with at least 60% alcohol.  Marland Kitchen  If coughing or sneezing, cover your mouth and nose by coughing or sneezing into the elbow areas of your shirt or coat, into a tissue or into your sleeve (not your hands). . Avoid shaking hands with others and consider head nods or verbal greetings only. . Avoid touching your eyes, nose, or mouth with unwashed hands.  . Avoid close contact with people who are sick. . Avoid places or events with large numbers of people in one  location, like concerts or sporting events. . Carefully consider travel plans you have or are making. . If you are planning any travel outside or inside the [place], visit the CDC's Travelers' Health webpage for the latest health notices. . If you have some symptoms but not all symptoms, continue to monitor at home and seek medical attention if your symptoms worsen. . If you are having a medical emergency, call 911.   [place] / e-Visit: [email or web address]         [facility]: [facility]: [phone]                   [facility]: [phone]

## 2019-11-28 NOTE — Progress Notes (Signed)
[facility] OFFICE PROGRESS NOTE   Diagnosis: Non-small cell lung cancer  INTERVAL HISTORY:   Barbara Watkins returns as scheduled.  She completed cycle 7 Alimta/pembrolizumab 11/08/2019.  She denies nausea/vomiting.  No mouth sores.  No diarrhea.  No rash.  She denies fever, cough, shortness of breath.  Objective:  Vital signs in last 24 hours:  Blood pressure (!) 138/97, pulse 93, temperature 98.3 F (36.8 C), temperature source Temporal, resp. rate 17, height 5' 4" (1.626 m), weight 118 lb 6.4 oz (53.7 kg), SpO2 100 %.    HEENT: No thrush or ulcers. Resp: Respirations even, unlabored. GI: Abdomen soft and nontender.  No hepatomegaly. Vascular: No leg edema. Neuro: Alert and oriented. Skin: No rash.   Lab Results:  Lab Results  Component Value Date   WBC 4.9 11/28/2019   HGB 10.1 (L) 11/28/2019   HCT 31.3 (L) 11/28/2019   MCV 103.3 (H) 11/28/2019   PLT 327 11/28/2019   NEUTROABS 2.5 11/28/2019    Imaging:  No results found.  Medications: I have reviewed the patient's current medications.  Assessment/Plan: 1.Metastatic non-small cell lung cancer   CTabdomen/pelvis 06/12/2019-scattered soft tissue masses in left hepatic lobe, right kidney, and the right adrenal gland. Fullness of the left hilum suspicious for left hilar mass and small nodules at the right lung base. Also noted signs of right pyelonephritis.  CT chest 06/13/2019-large encasing soft tissue mass left hilum measuring at least 5.8 x 4.5 x 3.8 cm with narrowing of the left mainstem bronchus and left pulmonary artery. Near-total obstructive atelectasis of the left upper lobe with a portion of the lingula remaining aerated. Multiple bilateral pulmonary nodules.  Biopsy left supraclavicular lymph node 06/15/2019-metastatic lung adenocarcinoma; immunohistochemistry positive for cytokeratin 7, TTF-1, PAX 8 (weak). Napsin-A and CDX-2negative.Foundation 1-microsatellite stable, tumor  mutational burden 15, ATM alteration, KRAS G13D;tumor proportion score 100%  Brain CT 06/29/2019-negative for metastatic disease  Cycle 1 carboplatin/Alimta/pembrolizumab 06/29/2019  Cycle 2 carboplatin/Alimta/pembrolizumab 07/20/2019  Cycle 3 carboplatin/Alimta 08/10/2019, pembrolizumab held due to rash  Cycle 4 carboplatin/Alimta, Udenyca added, pembrolizumab held9/01/2019  Cycle 5 Alimta alone (carboplatin discontinued, pembrolizumab held)09/22/2019  CTs 10/06/2019-decrease in size of central left upper lobe pulmonary mass; bilateral pulmonary nodules of varying size some of which are irregular in shape; many of these are stable in the interval.  Some of the right lung nodules have decreased/resolved since the prior study.  Some new irregular nodules in the left lower lobe indeterminate but potentially infectious/inflammatory.  Marked interval decrease in size of left hepatic and right renal metastases.  Metastatic retroperitoneal lymphadenopathy in the abdomen has clearly decreased.  Metastatic nodules in the right omentum and perirenal fat bilaterally have decreased/resolved.  No new or progressive findings.  Cycle 6 Alimta/pembrolizumab 10/17/2019  Cycle 7 Alimta/pembrolizumab 11/08/2019  Cycle 8 Alimta/pembrolizumab 11/28/2019 2. Anemia 3.Urinary tract infection 4. 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 Nausea 6. Constipation 7.Right abdominal pain secondary to #1 8.Skin rashbilateral forearms, grade 2, likely related to pembrolizumab7/22/2020; grade 3 08/03/2019, prednisone initiated; rash improved 08/10/2019, prednisone decreased to 10 mg daily x7 days then discontinued   Disposition: Barbara Watkins appears stable.  She has completed 7 cycles of Alimta/pembrolizumab.  Plan to proceed with cycle 8 today as scheduled.  We reviewed the CBC and chemistry panel from today, adequate to proceed with treatment.  Creatinine with stable mild elevation.  She will return  for lab, follow-up, Alimta/pembrolizumab in 3 weeks.  She will contact the office in the interim with   any problems.    Ned Card ANP/GNP-BC   11/28/2019  1:13 PM

## 2019-12-02 IMAGING — CT CT HEAD WITHOUT AND WITH CONTRAST
3 of 4 series · 14 of 47 positions shown, 16 images · IV contrast (OMNIPAQUE)
Comparison: None.

CLINICAL DATA: Non-small cell lung cancer staging.

EXAM:
CT HEAD WITHOUT AND WITH CONTRAST
TECHNIQUE: Contiguous axial images were obtained from the base of the skull
through the vertex without and with intravenous contrast
CONTRAST:  75mL OMNIPAQUE IOHEXOL 300 MG/ML  SOLN

[Series 2: head wo · axial · 0.47mm/px · z∈[-151,-26]mm · 8 of 31 slices shown, 10 images]
[im 3/31  brain]
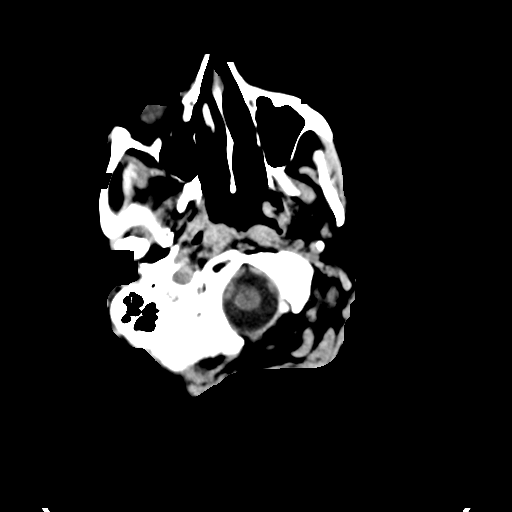
[im 3/31  bone]
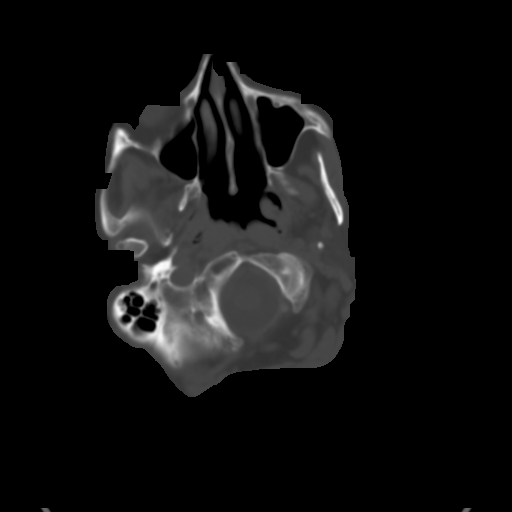
[im 7/31  brain]
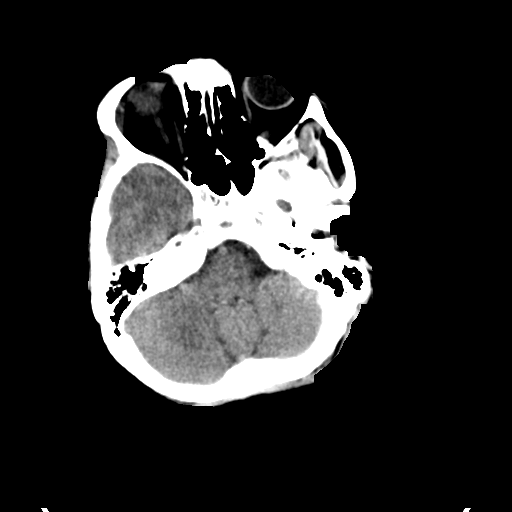
[im 11/31  brain]
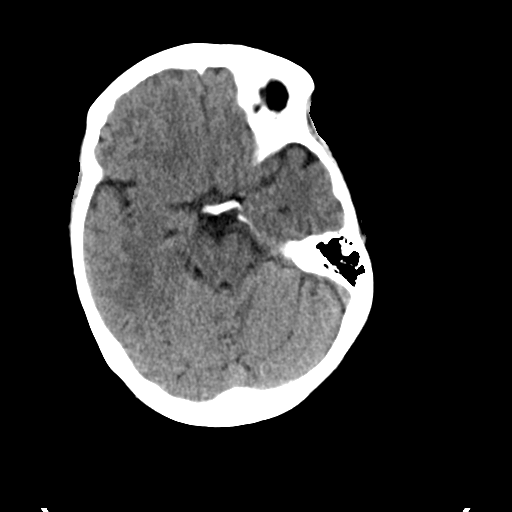
[im 13/31  brain]
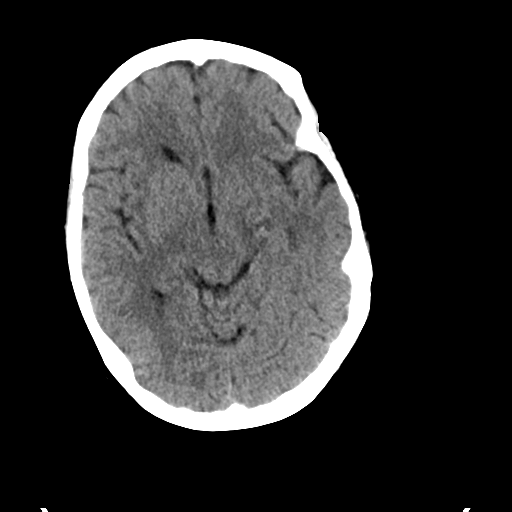
[im 18/31  brain]
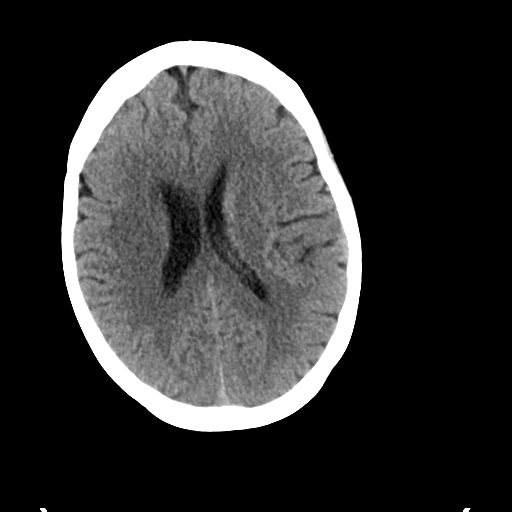
[im 18/31  bone]
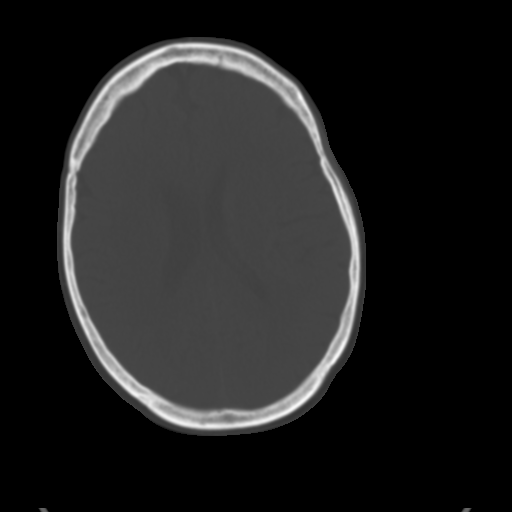
[im 20/31  brain]
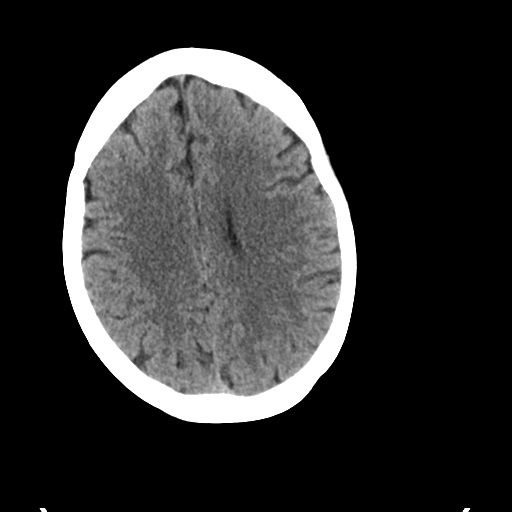
[im 24/31  brain]
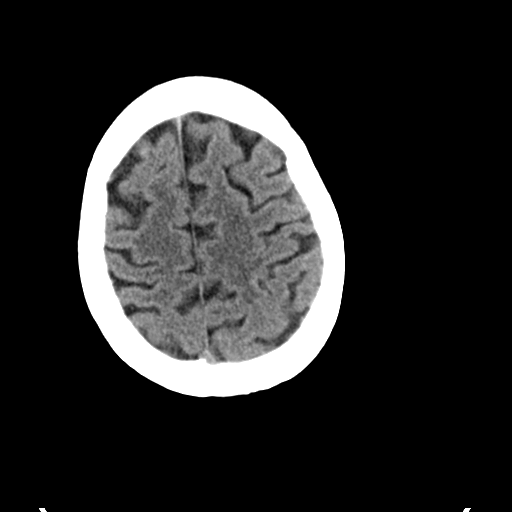
[im 28/31  brain]
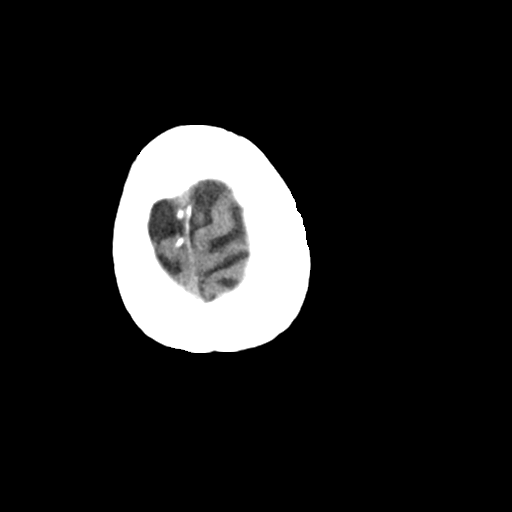

[Series 5: coronal soft tissue · coronal · 0.32mm/px · 3 of 67 slices shown]
[im 23/67  brain]
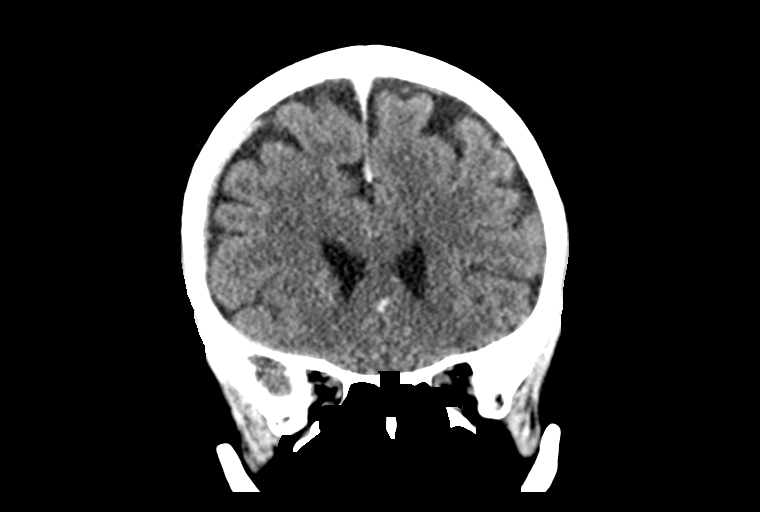
[im 30/67  brain]
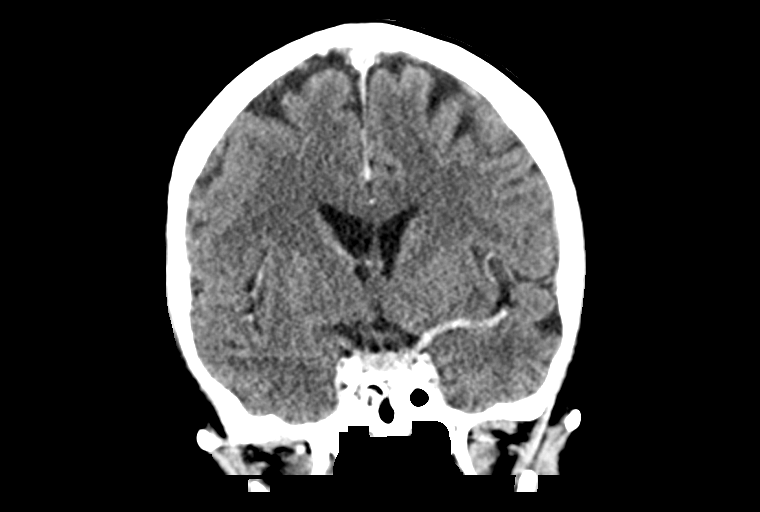
[im 37/67  brain]
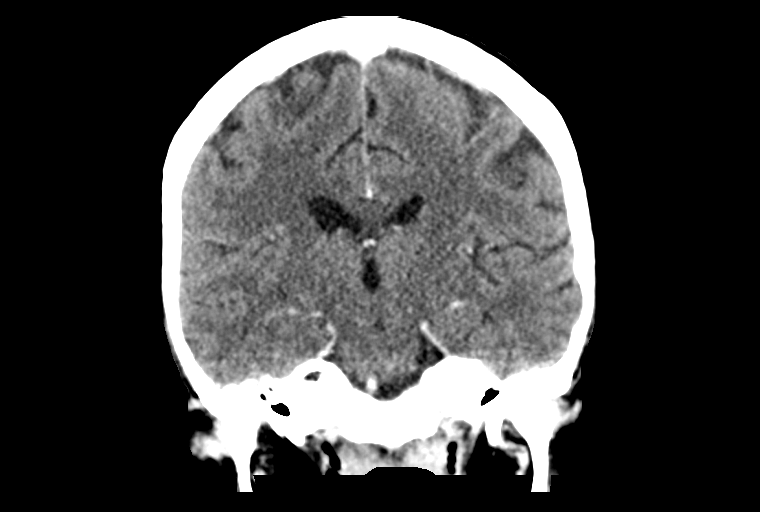

[Series 6: sagittal soft tissue · sagittal · 0.30mm/px · 3 of 52 slices shown]
[im 18/52  brain]
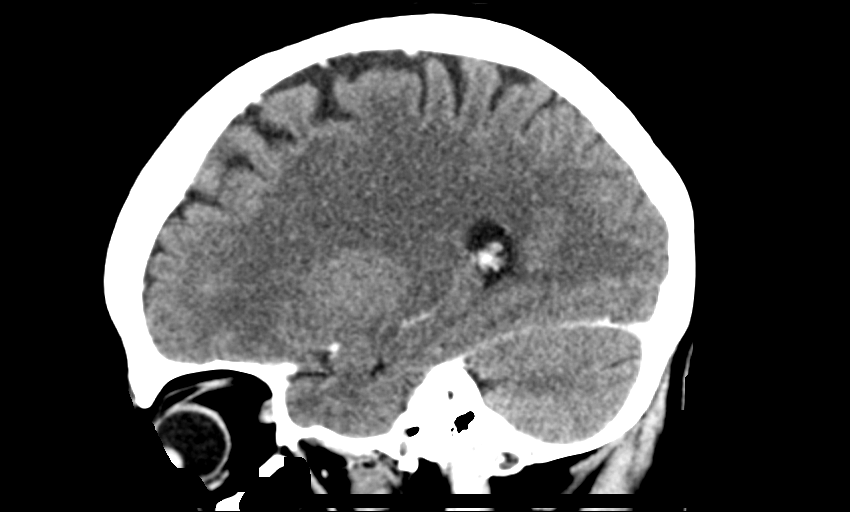
[im 26/52  brain]
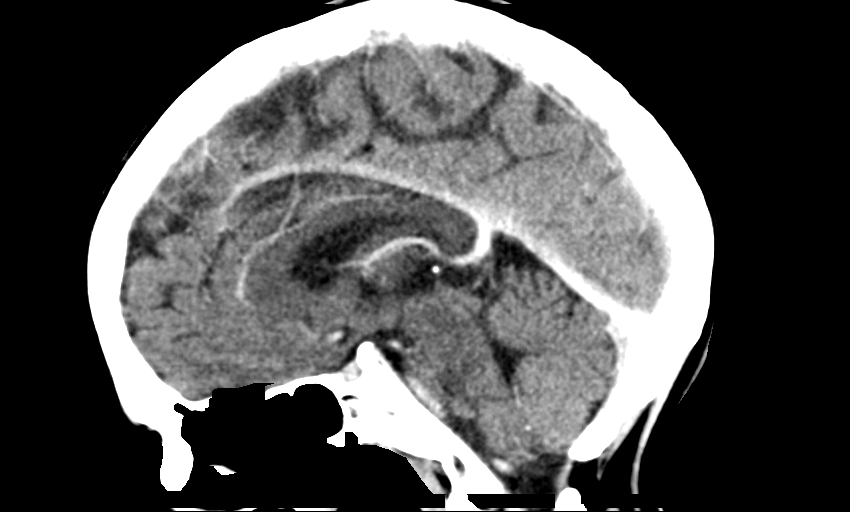
[im 35/52  brain]
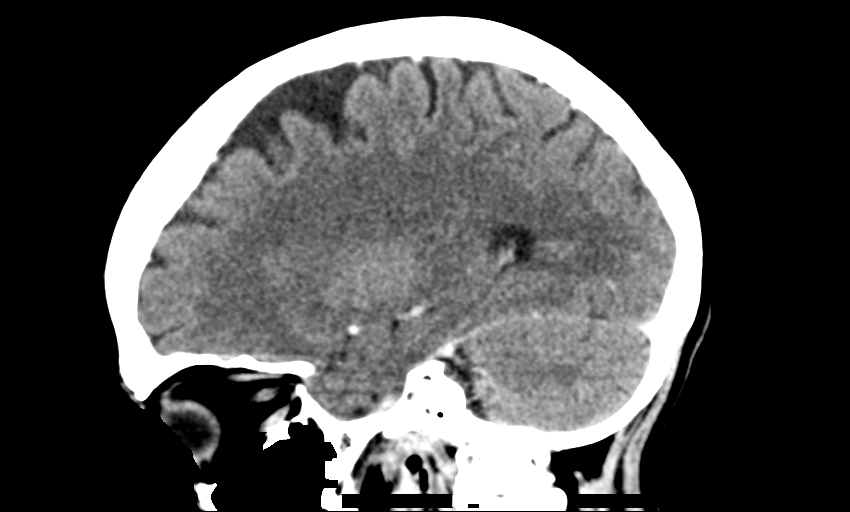

[14 of 47 positions shown; findings below may reference images not displayed]

FINDINGS: Brain: There is no evidence of acute infarct, intracranial
hemorrhage, mass, midline shift, or extra-axial fluid collection.
The ventricles and sulci are normal. No abnormal enhancement is
identified.

Vascular: Calcified atherosclerosis at the skull base. Major dural
venous sinuses and large intracranial arteries are grossly patent.

Skull: No fracture or focal osseous lesion.

Sinuses/Orbits: Visualized paranasal sinuses and mastoid air cells
are clear. Orbits are unremarkable.

Other: None.
IMPRESSION: Unremarkable CT appearance of the brain. No evidence of intracranial
metastases.

## 2019-12-06 MED FILL — FOLIC ACID 1 MG TABS: 1 | 30 days supply | Qty: 30 | Fill #4

## 2019-12-18 ENCOUNTER — Other Ambulatory Visit: Payer: Self-pay | Admitting: Oncology

## 2019-12-19 ENCOUNTER — Inpatient Hospital Stay: Payer: Medicaid Other

## 2019-12-19 ENCOUNTER — Inpatient Hospital Stay: Payer: Medicaid Other | Attending: Nurse Practitioner | Admitting: Nurse Practitioner

## 2019-12-19 ENCOUNTER — Encounter: Payer: Self-pay | Admitting: Nurse Practitioner

## 2019-12-19 ENCOUNTER — Other Ambulatory Visit: Payer: Self-pay

## 2019-12-19 VITALS — BP 132/79 | HR 98 | Temp 98.0°F | Resp 17 | Ht 64.0 in | Wt 117.8 lb

## 2019-12-19 DIAGNOSIS — C3412 Malignant neoplasm of upper lobe, left bronchus or lung: Secondary | ICD-10-CM | POA: Insufficient documentation

## 2019-12-19 DIAGNOSIS — R590 Localized enlarged lymph nodes: Secondary | ICD-10-CM | POA: Diagnosis not present

## 2019-12-19 DIAGNOSIS — N390 Urinary tract infection, site not specified: Secondary | ICD-10-CM | POA: Diagnosis not present

## 2019-12-19 DIAGNOSIS — C770 Secondary and unspecified malignant neoplasm of lymph nodes of head, face and neck: Secondary | ICD-10-CM | POA: Diagnosis not present

## 2019-12-19 DIAGNOSIS — C3492 Malignant neoplasm of unspecified part of left bronchus or lung: Secondary | ICD-10-CM

## 2019-12-19 DIAGNOSIS — D649 Anemia, unspecified: Secondary | ICD-10-CM | POA: Insufficient documentation

## 2019-12-19 DIAGNOSIS — K5900 Constipation, unspecified: Secondary | ICD-10-CM | POA: Insufficient documentation

## 2019-12-19 DIAGNOSIS — R110 Nausea: Secondary | ICD-10-CM | POA: Insufficient documentation

## 2019-12-19 DIAGNOSIS — Z5111 Encounter for antineoplastic chemotherapy: Secondary | ICD-10-CM | POA: Diagnosis not present

## 2019-12-19 DIAGNOSIS — Z79899 Other long term (current) drug therapy: Secondary | ICD-10-CM | POA: Insufficient documentation

## 2019-12-19 DIAGNOSIS — C7902 Secondary malignant neoplasm of left kidney and renal pelvis: Secondary | ICD-10-CM | POA: Insufficient documentation

## 2019-12-19 LAB — CMP (CANCER CENTER ONLY)
ALT: 18 U/L (ref 0–44)
AST: 23 U/L (ref 15–41)
Albumin: 3.8 g/dL (ref 3.5–5.0)
Alkaline Phosphatase: 115 U/L (ref 38–126)
Anion gap: 11 (ref 5–15)
BUN: 12 mg/dL (ref 6–20)
CO2: 21 mmol/L — ABNORMAL LOW (ref 22–32)
Calcium: 10.2 mg/dL (ref 8.9–10.3)
Chloride: 106 mmol/L (ref 98–111)
Creatinine: 1.3 mg/dL — ABNORMAL HIGH (ref 0.44–1.00)
GFR, Est AFR Am: 52 mL/min — ABNORMAL LOW (ref >60)
GFR, Estimated: 45 mL/min — ABNORMAL LOW (ref >60)
Glucose, Bld: 137 mg/dL — ABNORMAL HIGH (ref 70–99)
Potassium: 3.9 mmol/L (ref 3.5–5.1)
Sodium: 138 mmol/L (ref 135–145)
Total Bilirubin: 0.3 mg/dL (ref 0.3–1.2)
Total Protein: 7.6 g/dL (ref 6.5–8.1)

## 2019-12-19 LAB — CBC WITH DIFFERENTIAL (CANCER CENTER ONLY)
Abs Immature Granulocytes: 0.01 10*3/uL (ref 0.00–0.07)
Basophils Absolute: 0.1 10*3/uL (ref 0.0–0.1)
Basophils Relative: 1 %
Eosinophils Absolute: 0.4 10*3/uL (ref 0.0–0.5)
Eosinophils Relative: 7 %
HCT: 32.8 % — ABNORMAL LOW (ref 36.0–46.0)
Hemoglobin: 10.6 g/dL — ABNORMAL LOW (ref 12.0–15.0)
Immature Granulocytes: 0 %
Lymphocytes Relative: 27 %
Lymphs Abs: 1.5 10*3/uL (ref 0.7–4.0)
MCH: 33.1 pg (ref 26.0–34.0)
MCHC: 32.3 g/dL (ref 30.0–36.0)
MCV: 102.5 fL — ABNORMAL HIGH (ref 80.0–100.0)
Monocytes Absolute: 0.7 10*3/uL (ref 0.1–1.0)
Monocytes Relative: 13 %
Neutro Abs: 2.9 10*3/uL (ref 1.7–7.7)
Neutrophils Relative %: 52 %
Platelet Count: 263 10*3/uL (ref 150–400)
RBC: 3.2 MIL/uL — ABNORMAL LOW (ref 3.87–5.11)
RDW: 14.3 % (ref 11.5–15.5)
WBC Count: 5.5 10*3/uL (ref 4.0–10.5)
nRBC: 0 % (ref 0.0–0.2)

## 2019-12-19 LAB — TSH: TSH: 2.33 u[IU]/mL (ref 0.308–3.960)

## 2019-12-19 MED ORDER — PROCHLORPERAZINE MALEATE 10 MG PO TABS
ORAL_TABLET | ORAL | Status: AC
  Filled 2019-12-19: qty 1

## 2019-12-19 MED ORDER — CYANOCOBALAMIN 1000 MCG/ML IJ SOLN
1000.0000 ug | Freq: Once | INTRAMUSCULAR | Status: AC
  Administered 2019-12-19: 1000 ug via INTRAMUSCULAR

## 2019-12-19 MED ORDER — PROCHLORPERAZINE MALEATE 10 MG PO TABS
10.0000 mg | ORAL_TABLET | Freq: Once | ORAL | Status: AC
  Administered 2019-12-19: 10 mg via ORAL

## 2019-12-19 MED ORDER — SODIUM CHLORIDE 0.9 % IV SOLN: 500.0000 mg/m2 | Freq: Once | INTRAVENOUS | Status: DC

## 2019-12-19 MED ORDER — SODIUM CHLORIDE 0.9 % IV SOLN
Freq: Once | INTRAVENOUS | Status: AC
  Filled 2019-12-19: qty 250

## 2019-12-19 MED ORDER — CYANOCOBALAMIN 1000 MCG/ML IJ SOLN
INTRAMUSCULAR | Status: AC
  Filled 2019-12-19: qty 1

## 2019-12-19 MED ORDER — SODIUM CHLORIDE 0.9 % IV SOLN
200.0000 mg | Freq: Once | INTRAVENOUS | Status: AC
  Administered 2019-12-19: 200 mg via INTRAVENOUS
  Filled 2019-12-19: qty 8

## 2019-12-19 NOTE — Progress Notes (Signed)
Hold Alimta today for crcl < 40 mL/min per Dr Benay Spice

## 2019-12-19 NOTE — Patient Instructions (Signed)
[facility] Discharge Instructions for Patients Receiving Chemotherapy  Today you received the following chemotherapy agents:  Keytruda.  To help prevent nausea and vomiting after your treatment, we encourage you to take your nausea medication as directed.   If you develop nausea and vomiting that is not controlled by your nausea medication, call the clinic.   BELOW ARE SYMPTOMS THAT SHOULD BE REPORTED IMMEDIATELY:  *FEVER GREATER THAN 100.5 F  *CHILLS WITH OR WITHOUT FEVER  NAUSEA AND VOMITING THAT IS NOT CONTROLLED WITH YOUR NAUSEA MEDICATION  *UNUSUAL SHORTNESS OF BREATH  *UNUSUAL BRUISING OR BLEEDING  TENDERNESS IN MOUTH AND THROAT WITH OR WITHOUT PRESENCE OF ULCERS  *URINARY PROBLEMS  *BOWEL PROBLEMS  UNUSUAL RASH Items with * indicate a potential emergency and should be followed up as soon as possible.  Feel free to call the clinic should you have any questions or concerns. The clinic phone number is [phone].  Please show the CHEMO ALERT CARD at check-in to the Emergency Department and triage nurse.    

## 2019-12-19 NOTE — Progress Notes (Signed)
[facility] OFFICE PROGRESS NOTE   Diagnosis: Non-small cell lung cancer  INTERVAL HISTORY:   Barbara Watkins returns as scheduled.  She completed cycle 8 of Alimta/pembrolizumab 11/28/2019.  She feels well.  She denies nausea/vomiting.  No mouth sores.  No diarrhea.  No rash.  She denies fever, cough, shortness of breath.  Objective:  Vital signs in last 24 hours:  Blood pressure 132/79, pulse 98, temperature 98 F (36.7 C), temperature source Temporal, resp. rate 17, height 5' 4"  (1.626 m), weight 117 lb 12.8 oz (53.4 kg), SpO2 100 %.    HEENT: No thrush or ulcers. Resp: Distant breath sounds.  No respiratory distress. Cardio: Regular rate and rhythm. GI: Abdomen soft and nontender.  No hepatomegaly. Vascular: No leg edema. Neuro: Alert and oriented. Skin: No rash.   Lab Results:  Lab Results  Component Value Date   WBC 5.5 12/19/2019   HGB 10.6 (L) 12/19/2019   HCT 32.8 (L) 12/19/2019   MCV 102.5 (H) 12/19/2019   PLT 263 12/19/2019   NEUTROABS 2.9 12/19/2019    Imaging:  No results found.   Medications: I have reviewed the patient's current medications.  Assessment/Plan: 1.Metastatic non-small cell lung cancer   CTabdomen/pelvis 06/12/2019-scattered soft tissue masses in left hepatic lobe, right kidney, and the right adrenal gland. Fullness of the left hilum suspicious for left hilar mass and small nodules at the right lung base. Also noted signs of right pyelonephritis.  CT chest 06/13/2019-large encasing soft tissue mass left hilum measuring at least 5.8 x 4.5 x 3.8 cm with narrowing of the left mainstem bronchus and left pulmonary artery. Near-total obstructive atelectasis of the left upper lobe with a portion of the lingula remaining aerated. Multiple bilateral pulmonary nodules.  Biopsy left supraclavicular lymph node 06/15/2019-metastatic lung adenocarcinoma; immunohistochemistry positive for cytokeratin 7, TTF-1, PAX 8 (weak). Napsin-A  and CDX-2negative.Foundation 1-microsatellite stable, tumor mutational burden 15, ATM alteration, KRAS G13D;tumor proportion score 100%  Brain CT 06/29/2019-negative for metastatic disease  Cycle 1 carboplatin/Alimta/pembrolizumab 06/29/2019  Cycle 2 carboplatin/Alimta/pembrolizumab 07/20/2019  Cycle 3 carboplatin/Alimta 08/10/2019, pembrolizumab held due to rash  Cycle 4 carboplatin/Alimta, Udenyca added, pembrolizumab held9/01/2019  Cycle 5 Alimta alone (carboplatin discontinued, pembrolizumab held)09/22/2019  CTs 10/06/2019-decrease in size of central left upper lobe pulmonary mass; bilateral pulmonary nodules of varying size some of which are irregular in shape; many of these are stable in the interval. Some of the right lung nodules have decreased/resolved since the prior study. Some new irregular nodules in the left lower lobe indeterminate but potentially infectious/inflammatory. Marked interval decrease in size of left hepatic and right renal metastases. Metastatic retroperitoneal lymphadenopathy in the abdomen has clearly decreased. Metastatic nodules in the right omentum and perirenal fat bilaterally have decreased/resolved. No new or progressive findings.  Cycle 6 Alimta/pembrolizumab 10/17/2019  Cycle 7 Alimta/pembrolizumab 11/08/2019  Cycle 8 Alimta/pembrolizumab 11/28/2019  Cycle 9 Alimta/pembrolizumab 12/19/2019 2. Anemia 3.Urinary tract infection 4. 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 Nausea 6. Constipation 7.Right abdominal pain secondary to #1 8.Skin rashbilateral forearms, grade 2, likely related to pembrolizumab7/22/2020; grade 3 08/03/2019, prednisone initiated; rash improved 08/10/2019, prednisone decreased to 10 mg daily x7 days then discontinued   Disposition: Barbara Watkins appears stable.  She has completed 8 cycles of Alimta/pembrolizumab.  She continues to tolerate treatment well.  Plan to proceed with cycle 9 today as  scheduled.  We reviewed the CBC and chemistry panel from today, adequate to proceed with treatment as above.  Creatinine is mildly elevated.  We will continue to monitor.  She will return for lab, follow-up, cycle 10 Alimta/pembrolizumab in 3 weeks.  She will contact the office in the interim with any problems.  Ned Card ANP/GNP-BC   12/19/2019  2:01 PM

## 2019-12-21 ENCOUNTER — Telehealth: Payer: Self-pay | Admitting: Oncology

## 2019-12-21 NOTE — Telephone Encounter (Signed)
Scheduled per los. Called and left msg. Mailed printout  °

## 2019-12-27 ENCOUNTER — Telehealth: Payer: Self-pay | Admitting: Oncology

## 2019-12-27 NOTE — Telephone Encounter (Signed)
Patient called to reschedule 1/11 appt. Rescheduled per request. Moved to 1/12

## 2020-01-08 ENCOUNTER — Other Ambulatory Visit: Payer: Self-pay | Admitting: Oncology

## 2020-01-09 ENCOUNTER — Ambulatory Visit: Payer: Medicaid Other | Admitting: Oncology

## 2020-01-09 ENCOUNTER — Other Ambulatory Visit: Payer: Medicaid Other

## 2020-01-09 ENCOUNTER — Ambulatory Visit: Payer: Medicaid Other

## 2020-01-10 ENCOUNTER — Other Ambulatory Visit: Payer: Self-pay

## 2020-01-10 ENCOUNTER — Inpatient Hospital Stay (HOSPITAL_BASED_OUTPATIENT_CLINIC_OR_DEPARTMENT_OTHER): Payer: Medicaid Other | Admitting: Nurse Practitioner

## 2020-01-10 ENCOUNTER — Encounter: Payer: Self-pay | Admitting: Nurse Practitioner

## 2020-01-10 ENCOUNTER — Inpatient Hospital Stay: Payer: Medicaid Other

## 2020-01-10 ENCOUNTER — Inpatient Hospital Stay: Payer: Medicaid Other | Attending: Nurse Practitioner

## 2020-01-10 VITALS — BP 136/72 | HR 86 | Temp 98.7°F | Resp 18 | Ht 64.0 in | Wt 119.2 lb

## 2020-01-10 DIAGNOSIS — C7902 Secondary malignant neoplasm of left kidney and renal pelvis: Secondary | ICD-10-CM | POA: Insufficient documentation

## 2020-01-10 DIAGNOSIS — Z5111 Encounter for antineoplastic chemotherapy: Secondary | ICD-10-CM | POA: Insufficient documentation

## 2020-01-10 DIAGNOSIS — C799 Secondary malignant neoplasm of unspecified site: Secondary | ICD-10-CM | POA: Diagnosis not present

## 2020-01-10 DIAGNOSIS — R110 Nausea: Secondary | ICD-10-CM | POA: Insufficient documentation

## 2020-01-10 DIAGNOSIS — C3492 Malignant neoplasm of unspecified part of left bronchus or lung: Secondary | ICD-10-CM

## 2020-01-10 DIAGNOSIS — C3412 Malignant neoplasm of upper lobe, left bronchus or lung: Secondary | ICD-10-CM | POA: Insufficient documentation

## 2020-01-10 DIAGNOSIS — D649 Anemia, unspecified: Secondary | ICD-10-CM | POA: Insufficient documentation

## 2020-01-10 DIAGNOSIS — N390 Urinary tract infection, site not specified: Secondary | ICD-10-CM | POA: Insufficient documentation

## 2020-01-10 DIAGNOSIS — R590 Localized enlarged lymph nodes: Secondary | ICD-10-CM | POA: Diagnosis not present

## 2020-01-10 DIAGNOSIS — Z79899 Other long term (current) drug therapy: Secondary | ICD-10-CM | POA: Diagnosis not present

## 2020-01-10 DIAGNOSIS — K5900 Constipation, unspecified: Secondary | ICD-10-CM | POA: Diagnosis not present

## 2020-01-10 DIAGNOSIS — C770 Secondary and unspecified malignant neoplasm of lymph nodes of head, face and neck: Secondary | ICD-10-CM | POA: Diagnosis not present

## 2020-01-10 LAB — CMP (CANCER CENTER ONLY)
ALT: 12 U/L (ref 0–44)
AST: 18 U/L (ref 15–41)
Albumin: 4.1 g/dL (ref 3.5–5.0)
Alkaline Phosphatase: 118 U/L (ref 38–126)
Anion gap: 12 (ref 5–15)
BUN: 16 mg/dL (ref 6–20)
CO2: 20 mmol/L — ABNORMAL LOW (ref 22–32)
Calcium: 10 mg/dL (ref 8.9–10.3)
Chloride: 108 mmol/L (ref 98–111)
Creatinine: 1.17 mg/dL — ABNORMAL HIGH (ref 0.44–1.00)
GFR, Est AFR Am: 59 mL/min — ABNORMAL LOW (ref >60)
GFR, Estimated: 51 mL/min — ABNORMAL LOW (ref >60)
Glucose, Bld: 62 mg/dL — ABNORMAL LOW (ref 70–99)
Potassium: 3.9 mmol/L (ref 3.5–5.1)
Sodium: 140 mmol/L (ref 135–145)
Total Bilirubin: 0.3 mg/dL (ref 0.3–1.2)
Total Protein: 7.4 g/dL (ref 6.5–8.1)

## 2020-01-10 LAB — CBC WITH DIFFERENTIAL (CANCER CENTER ONLY)
Abs Immature Granulocytes: 0.01 10*3/uL (ref 0.00–0.07)
Basophils Absolute: 0.1 10*3/uL (ref 0.0–0.1)
Basophils Relative: 1 %
Eosinophils Absolute: 0.4 10*3/uL (ref 0.0–0.5)
Eosinophils Relative: 7 %
HCT: 35.9 % — ABNORMAL LOW (ref 36.0–46.0)
Hemoglobin: 11.6 g/dL — ABNORMAL LOW (ref 12.0–15.0)
Immature Granulocytes: 0 %
Lymphocytes Relative: 36 %
Lymphs Abs: 2 10*3/uL (ref 0.7–4.0)
MCH: 31.8 pg (ref 26.0–34.0)
MCHC: 32.3 g/dL (ref 30.0–36.0)
MCV: 98.4 fL (ref 80.0–100.0)
Monocytes Absolute: 0.5 10*3/uL (ref 0.1–1.0)
Monocytes Relative: 9 %
Neutro Abs: 2.7 10*3/uL (ref 1.7–7.7)
Neutrophils Relative %: 47 %
Platelet Count: 158 10*3/uL (ref 150–400)
RBC: 3.65 MIL/uL — ABNORMAL LOW (ref 3.87–5.11)
RDW: 13.4 % (ref 11.5–15.5)
WBC Count: 5.7 10*3/uL (ref 4.0–10.5)
nRBC: 0 % (ref 0.0–0.2)

## 2020-01-10 MED ORDER — SODIUM CHLORIDE 0.9 % IV SOLN: 500.0000 mg/m2 | Freq: Once | INTRAVENOUS | Status: DC

## 2020-01-10 MED ORDER — PROCHLORPERAZINE MALEATE 10 MG PO TABS
10.0000 mg | ORAL_TABLET | Freq: Once | ORAL | Status: AC
  Administered 2020-01-10: 10 mg via ORAL

## 2020-01-10 MED ORDER — PROCHLORPERAZINE MALEATE 10 MG PO TABS
ORAL_TABLET | ORAL | Status: AC
  Filled 2020-01-10: qty 1

## 2020-01-10 MED ORDER — SODIUM CHLORIDE 0.9 % IV SOLN
Freq: Once | INTRAVENOUS | Status: AC
  Filled 2020-01-10: qty 250

## 2020-01-10 MED ORDER — SODIUM CHLORIDE 0.9 % IV SOLN
200.0000 mg | Freq: Once | INTRAVENOUS | Status: AC
  Administered 2020-01-10: 200 mg via INTRAVENOUS
  Filled 2020-01-10: qty 8

## 2020-01-10 MED ORDER — FOLIC ACID 1 MG PO TABS: 1.0000 mg | ORAL_TABLET | Freq: Every day | ORAL | 6 refills | Status: DC

## 2020-01-10 MED FILL — FOLIC ACID 1 MG TABS: 1 | 30 days supply | Qty: 30 | Fill #0

## 2020-01-10 NOTE — Progress Notes (Addendum)
[place] OFFICE PROGRESS NOTE   Diagnosis: Non-small cell lung cancer  INTERVAL HISTORY:   [name] returns as scheduled.  She completed cycle 9 pembrolizumab 12/19/2019.  Alimta was held due to elevated creatinine.  She denies nausea/vomiting.  No mouth sores.  No diarrhea.  No skin rash.  She denies fever, cough, shortness of breath.  No pain.  She has a good appetite.  Objective:  Vital signs in last 24 hours:  Blood pressure 136/72, pulse 86, temperature 98.7 F (37.1 C), temperature source Temporal, resp. rate 18, height 5' 4"  (1.626 m), weight 119 lb 3.2 oz (54.1 kg).    HEENT: No thrush or ulcers. Resp: Lungs clear bilaterally. Cardio: Regular rate and rhythm. GI: Abdomen soft and nontender.  No hepatomegaly. Vascular: No leg edema.  Skin: Skin is dry appearing.  No rash.   Lab Results:  Lab Results  Component Value Date   WBC 5.7 01/10/2020   HGB 11.6 (L) 01/10/2020   HCT 35.9 (L) 01/10/2020   MCV 98.4 01/10/2020   PLT 158 01/10/2020   NEUTROABS 2.7 01/10/2020    Imaging:  No results found.  Medications: I have reviewed the patient's current medications.  Assessment/Plan: 1.Metastatic non-small cell lung cancer   CTabdomen/pelvis 06/12/2019-scattered soft tissue masses in left hepatic lobe, right kidney, and the right adrenal gland. Fullness of the left hilum suspicious for left hilar mass and small nodules at the right lung base. Also noted signs of right pyelonephritis.  CT chest 06/13/2019-large encasing soft tissue mass left hilum measuring at least 5.8 x 4.5 x 3.8 cm with narrowing of the left mainstem bronchus and left pulmonary artery. Near-total obstructive atelectasis of the left upper lobe with a portion of the lingula remaining aerated. Multiple bilateral pulmonary nodules.  Biopsy left supraclavicular lymph node 06/15/2019-metastatic lung adenocarcinoma; immunohistochemistry positive for cytokeratin 7, TTF-1, PAX 8  (weak). Napsin-A and CDX-2negative.Foundation 1-microsatellite stable, tumor mutational burden 15, ATM alteration, KRAS G13D;tumor proportion score 100%  Brain CT 06/29/2019-negative for metastatic disease  Cycle 1 carboplatin/Alimta/pembrolizumab 06/29/2019  Cycle 2 carboplatin/Alimta/pembrolizumab 07/20/2019  Cycle 3 carboplatin/Alimta 08/10/2019, pembrolizumab held due to rash  Cycle 4 carboplatin/Alimta, Udenyca added, pembrolizumab held9/01/2019  Cycle 5 Alimta alone (carboplatin discontinued, pembrolizumab held)09/22/2019  CTs 10/06/2019-decrease in size of central left upper lobe pulmonary mass; bilateral pulmonary nodules of varying size some of which are irregular in shape; many of these are stable in the interval. Some of the right lung nodules have decreased/resolved since the prior study. Some new irregular nodules in the left lower lobe indeterminate but potentially infectious/inflammatory. Marked interval decrease in size of left hepatic and right renal metastases. Metastatic retroperitoneal lymphadenopathy in the abdomen has clearly decreased. Metastatic nodules in the right omentum and perirenal fat bilaterally have decreased/resolved. No new or progressive findings.  Cycle 6 Alimta/pembrolizumab 10/17/2019  Cycle 7 Alimta/pembrolizumab 11/08/2019  Cycle 8 Alimta/pembrolizumab 11/28/2019  Cycle 9 Pembrolizumab 12/19/2019 (Alimta held due to elevated creatinine)  Cycle 10 Pembrolizumab 01/10/2020 (Alimta held due to borderline creatinine) 2. Anemia 3.Urinary tract infection 4. 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 Nausea 6. Constipation 7.Right abdominal pain secondary to #1 8.Skin rashbilateral forearms, grade 2, likely related to pembrolizumab7/22/2020; grade 3 08/03/2019, prednisone initiated; rash improved 08/10/2019, prednisone decreased to 10 mg daily x7 days then discontinued   Disposition: Ms. Theissen appears stable.  There is no  clinical evidence of disease progression.  She has completed 9 cycles of systemic therapy.  Alimta was held with cycle  9 due to an elevated creatinine.  Plan to proceed with cycle 10 today as scheduled.  Alimta will again be held.  She will undergo restaging CT scans prior to her next visit.  We reviewed the CBC from today.  Counts adequate to proceed with treatment.  We reviewed the chemistry panel.  Creatinine is borderline for treatment with Alimta.  Plan to hold Alimta today.  She will return for lab, follow-up, Alimta/pembrolizumab in 3 weeks.  She will contact the office in the interim with any problems.    Ned Card ANP/GNP-BC   01/10/2020  2:20 PM

## 2020-01-10 NOTE — Progress Notes (Signed)
Spoke w/ Lattie Haw, hold Alimta again today. CrCL is ~44 mL/min today. Patient is going to have a scan prior to next treatment and Lisa/Dr. Benay Spice will discuss plans for treatment going forward at that point.   Demetrius Charity, PharmD, [facility] Pharmacist Pharmacy Phone: [phone] 01/10/2020

## 2020-01-10 NOTE — Patient Instructions (Signed)
[facility] Discharge Instructions for Patients Receiving Chemotherapy  Today you received the following chemotherapy agents:  Keytruda.  To help prevent nausea and vomiting after your treatment, we encourage you to take your nausea medication as directed.   If you develop nausea and vomiting that is not controlled by your nausea medication, call the clinic.   BELOW ARE SYMPTOMS THAT SHOULD BE REPORTED IMMEDIATELY:  *FEVER GREATER THAN 100.5 F  *CHILLS WITH OR WITHOUT FEVER  NAUSEA AND VOMITING THAT IS NOT CONTROLLED WITH YOUR NAUSEA MEDICATION  *UNUSUAL SHORTNESS OF BREATH  *UNUSUAL BRUISING OR BLEEDING  TENDERNESS IN MOUTH AND THROAT WITH OR WITHOUT PRESENCE OF ULCERS  *URINARY PROBLEMS  *BOWEL PROBLEMS  UNUSUAL RASH Items with * indicate a potential emergency and should be followed up as soon as possible.  Feel free to call the clinic should you have any questions or concerns. The clinic phone number is [phone].  Please show the CHEMO ALERT CARD at check-in to the Emergency Department and triage nurse.    

## 2020-01-19 ENCOUNTER — Telehealth: Payer: Self-pay | Admitting: *Deleted

## 2020-01-19 NOTE — Telephone Encounter (Signed)
Notified Tammi in CT that renal functions are abnormal and Dr. Benay Spice is requesting a reduced dose of CT contrast on 01/26/20 scan.

## 2020-01-26 ENCOUNTER — Ambulatory Visit (HOSPITAL_COMMUNITY)
Admission: RE | Admit: 2020-01-26 | Discharge: 2020-01-26 | Disposition: A | Discharge status: Home / Self Care | Payer: Medicaid Other | Source: Ambulatory Visit | Attending: Nurse Practitioner | Admitting: Nurse Practitioner

## 2020-01-26 ENCOUNTER — Other Ambulatory Visit: Payer: Self-pay

## 2020-01-26 DIAGNOSIS — C799 Secondary malignant neoplasm of unspecified site: Secondary | ICD-10-CM | POA: Insufficient documentation

## 2020-01-26 DIAGNOSIS — C3492 Malignant neoplasm of unspecified part of left bronchus or lung: Secondary | ICD-10-CM | POA: Diagnosis present

## 2020-01-26 MED ORDER — SODIUM CHLORIDE (PF) 0.9 % IJ SOLN
INTRAMUSCULAR | Status: AC
  Filled 2020-01-26: qty 50

## 2020-01-26 MED ORDER — IOHEXOL 300 MG/ML  SOLN
100.0000 mL | Freq: Once | INTRAMUSCULAR | Status: AC | PRN
  Administered 2020-01-26: 100 mL via INTRAVENOUS

## 2020-01-29 ENCOUNTER — Other Ambulatory Visit: Payer: Self-pay | Admitting: Oncology

## 2020-01-31 ENCOUNTER — Inpatient Hospital Stay: Payer: Medicaid Other

## 2020-01-31 ENCOUNTER — Inpatient Hospital Stay: Payer: Medicaid Other | Attending: Nurse Practitioner | Admitting: Oncology

## 2020-01-31 ENCOUNTER — Other Ambulatory Visit: Payer: Self-pay

## 2020-01-31 VITALS — BP 126/67 | HR 89 | Temp 98.5°F | Resp 17 | Ht 64.0 in | Wt 119.7 lb

## 2020-01-31 DIAGNOSIS — C770 Secondary and unspecified malignant neoplasm of lymph nodes of head, face and neck: Secondary | ICD-10-CM | POA: Insufficient documentation

## 2020-01-31 DIAGNOSIS — C3492 Malignant neoplasm of unspecified part of left bronchus or lung: Secondary | ICD-10-CM

## 2020-01-31 DIAGNOSIS — D649 Anemia, unspecified: Secondary | ICD-10-CM | POA: Diagnosis not present

## 2020-01-31 DIAGNOSIS — C7902 Secondary malignant neoplasm of left kidney and renal pelvis: Secondary | ICD-10-CM | POA: Insufficient documentation

## 2020-01-31 DIAGNOSIS — R590 Localized enlarged lymph nodes: Secondary | ICD-10-CM | POA: Insufficient documentation

## 2020-01-31 DIAGNOSIS — C3412 Malignant neoplasm of upper lobe, left bronchus or lung: Secondary | ICD-10-CM | POA: Diagnosis not present

## 2020-01-31 DIAGNOSIS — K5900 Constipation, unspecified: Secondary | ICD-10-CM | POA: Diagnosis not present

## 2020-01-31 DIAGNOSIS — N390 Urinary tract infection, site not specified: Secondary | ICD-10-CM | POA: Diagnosis not present

## 2020-01-31 DIAGNOSIS — C799 Secondary malignant neoplasm of unspecified site: Secondary | ICD-10-CM

## 2020-01-31 DIAGNOSIS — Z79899 Other long term (current) drug therapy: Secondary | ICD-10-CM | POA: Insufficient documentation

## 2020-01-31 DIAGNOSIS — Z5111 Encounter for antineoplastic chemotherapy: Secondary | ICD-10-CM | POA: Insufficient documentation

## 2020-01-31 LAB — CBC WITH DIFFERENTIAL (CANCER CENTER ONLY)
Abs Immature Granulocytes: 0.03 10*3/uL (ref 0.00–0.07)
Basophils Absolute: 0.1 10*3/uL (ref 0.0–0.1)
Basophils Relative: 1 %
Eosinophils Absolute: 0.3 10*3/uL (ref 0.0–0.5)
Eosinophils Relative: 5 %
HCT: 38.8 % (ref 36.0–46.0)
Hemoglobin: 12.6 g/dL (ref 12.0–15.0)
Immature Granulocytes: 1 %
Lymphocytes Relative: 35 %
Lymphs Abs: 1.8 10*3/uL (ref 0.7–4.0)
MCH: 31.2 pg (ref 26.0–34.0)
MCHC: 32.5 g/dL (ref 30.0–36.0)
MCV: 96 fL (ref 80.0–100.0)
Monocytes Absolute: 0.4 10*3/uL (ref 0.1–1.0)
Monocytes Relative: 8 %
Neutro Abs: 2.7 10*3/uL (ref 1.7–7.7)
Neutrophils Relative %: 50 %
Platelet Count: 158 10*3/uL (ref 150–400)
RBC: 4.04 MIL/uL (ref 3.87–5.11)
RDW: 13.1 % (ref 11.5–15.5)
WBC Count: 5.3 10*3/uL (ref 4.0–10.5)
nRBC: 0 % (ref 0.0–0.2)

## 2020-01-31 LAB — CMP (CANCER CENTER ONLY)
ALT: 11 U/L (ref 0–44)
AST: 15 U/L (ref 15–41)
Albumin: 4.2 g/dL (ref 3.5–5.0)
Alkaline Phosphatase: 111 U/L (ref 38–126)
Anion gap: 10 (ref 5–15)
BUN: 16 mg/dL (ref 6–20)
CO2: 21 mmol/L — ABNORMAL LOW (ref 22–32)
Calcium: 10.3 mg/dL (ref 8.9–10.3)
Chloride: 110 mmol/L (ref 98–111)
Creatinine: 1.2 mg/dL — ABNORMAL HIGH (ref 0.44–1.00)
GFR, Est AFR Am: 57 mL/min — ABNORMAL LOW (ref >60)
GFR, Estimated: 49 mL/min — ABNORMAL LOW (ref >60)
Glucose, Bld: 87 mg/dL (ref 70–99)
Potassium: 3.8 mmol/L (ref 3.5–5.1)
Sodium: 141 mmol/L (ref 135–145)
Total Bilirubin: 0.4 mg/dL (ref 0.3–1.2)
Total Protein: 7.7 g/dL (ref 6.5–8.1)

## 2020-01-31 MED ORDER — SODIUM CHLORIDE 0.9 % IV SOLN
200.0000 mg | Freq: Once | INTRAVENOUS | Status: AC
  Administered 2020-01-31: 14:00:00 200 mg via INTRAVENOUS
  Filled 2020-01-31: qty 8

## 2020-01-31 MED ORDER — CYANOCOBALAMIN 1000 MCG/ML IJ SOLN: 1000.0000 ug | Freq: Once | INTRAMUSCULAR | Status: DC

## 2020-01-31 MED ORDER — SODIUM CHLORIDE 0.9 % IV SOLN
Freq: Once | INTRAVENOUS | Status: AC
  Filled 2020-01-31: qty 250

## 2020-01-31 MED ORDER — TRAMADOL HCL 50 MG PO TABS: 25.0000 mg | ORAL_TABLET | Freq: Two times a day (BID) | ORAL | 1 refills | Status: DC | PRN

## 2020-01-31 MED ORDER — SODIUM CHLORIDE 0.9 % IV SOLN
400.0000 mg/m2 | Freq: Once | INTRAVENOUS | Status: AC
  Administered 2020-01-31: 14:00:00 600 mg via INTRAVENOUS
  Filled 2020-01-31: qty 4

## 2020-01-31 MED ORDER — PROCHLORPERAZINE MALEATE 10 MG PO TABS
ORAL_TABLET | ORAL | Status: AC
  Filled 2020-01-31: qty 1

## 2020-01-31 MED ORDER — PROCHLORPERAZINE MALEATE 10 MG PO TABS
10.0000 mg | ORAL_TABLET | Freq: Once | ORAL | Status: AC
  Administered 2020-01-31: 10 mg via ORAL

## 2020-01-31 MED FILL — traMADol HCL 50 MG TABS: 50 | 30 days supply | Qty: 30 | Fill #0

## 2020-01-31 NOTE — Patient Instructions (Signed)
[facility] Discharge Instructions for Patients Receiving Chemotherapy  Today you received the following chemotherapy agents: Pembrolizumab (Keytruda) and Pemetrexed (Alimta)  To help prevent nausea and vomiting after your treatment, we encourage you to take your nausea medication as directed.   If you develop nausea and vomiting that is not controlled by your nausea medication, call the clinic.   BELOW ARE SYMPTOMS THAT SHOULD BE REPORTED IMMEDIATELY:  *FEVER GREATER THAN 100.5 F  *CHILLS WITH OR WITHOUT FEVER  NAUSEA AND VOMITING THAT IS NOT CONTROLLED WITH YOUR NAUSEA MEDICATION  *UNUSUAL SHORTNESS OF BREATH  *UNUSUAL BRUISING OR BLEEDING  TENDERNESS IN MOUTH AND THROAT WITH OR WITHOUT PRESENCE OF ULCERS  *URINARY PROBLEMS  *BOWEL PROBLEMS  UNUSUAL RASH Items with * indicate a potential emergency and should be followed up as soon as possible.  Feel free to call the clinic should you have any questions or concerns. The clinic phone number is [phone].  Please show the [place] at check-in to the Emergency Department and triage nurse.  Coronavirus (COVID-19) Are you at risk?  Are you at risk for the Coronavirus (COVID-19)?  To be considered HIGH RISK for Coronavirus (COVID-19), you have to meet the following criteria:  . Traveled to [place], [place], [place], [place] or [place]; or in the [place] to [place], [place], [place], or [place]; and have fever, cough, and shortness of breath within the last 2 weeks of travel OR . Been in close contact with a person diagnosed with COVID-19 within the last 2 weeks and have fever, cough, and shortness of breath . IF YOU DO NOT MEET THESE CRITERIA, YOU ARE CONSIDERED LOW RISK FOR COVID-19.  What to do if you are HIGH RISK for COVID-19?  Marland Kitchen If you are having a medical emergency, call 911. . Seek medical care right away. Before you go to a doctor's office, urgent care or emergency department,  call ahead and tell them about your recent travel, contact with someone diagnosed with COVID-19, and your symptoms. You should receive instructions from your physician's office regarding next steps of care.  . When you arrive at healthcare provider, tell the healthcare staff immediately you have returned from visiting [place], [place], [place], [place] or [place]; or traveled in the [place] to [place], [place], [place], or [place]; in the last two weeks or you have been in close contact with a person diagnosed with COVID-19 in the last 2 weeks.   . Tell the health care staff about your symptoms: fever, cough and shortness of breath. . After you have been seen by a medical provider, you will be either: o Tested for (COVID-19) and discharged home on quarantine except to seek medical care if symptoms worsen, and asked to  - Stay home and avoid contact with others until you get your results (4-5 days)  - Avoid travel on public transportation if possible (such as bus, train, or airplane) or o Sent to the Emergency Department by EMS for evaluation, COVID-19 testing, and possible admission depending on your condition and test results.  What to do if you are LOW RISK for COVID-19?  Reduce your risk of any infection by using the same precautions used for avoiding the common cold or flu:  Marland Kitchen Wash your hands often with soap and warm water for at least 20 seconds.  If soap and water are not readily available, use an alcohol-based hand sanitizer with at least 60% alcohol.  Marland Kitchen  If coughing or sneezing, cover your mouth and nose by coughing or sneezing into the elbow areas of your shirt or coat, into a tissue or into your sleeve (not your hands). . Avoid shaking hands with others and consider head nods or verbal greetings only. . Avoid touching your eyes, nose, or mouth with unwashed hands.  . Avoid close contact with people who are sick. . Avoid places or events with large numbers of people in one  location, like concerts or sporting events. . Carefully consider travel plans you have or are making. . If you are planning any travel outside or inside the [place], visit the CDC's Travelers' Health webpage for the latest health notices. . If you have some symptoms but not all symptoms, continue to monitor at home and seek medical attention if your symptoms worsen. . If you are having a medical emergency, call 911.   [facility] / e-Visit: [email or web address]         [facility]: [facility]: [phone]                   [facility]: [phone]

## 2020-01-31 NOTE — Progress Notes (Signed)
Per Dr. 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 She will also discontinue naproxen use.   Demetrius Charity, PharmD, [facility] Pharmacist [facility]acy Phone: [phone] 01/31/2020

## 2020-01-31 NOTE — Progress Notes (Signed)
[facility] OFFICE PROGRESS NOTE   Diagnosis: Non-small cell lung cancer  INTERVAL HISTORY:   [name] completed another cycle of pembrolizumab on 01/10/2020.  No rash or diarrhea.  She feels well.  She has chronic back pain and takes naproxen twice daily.  Objective:  Vital signs in last 24 hours:  Blood pressure 126/67, pulse 89, temperature 98.5 F (36.9 C), temperature source Temporal, resp. rate 17, height _0  (1.626 m), weight 119 lb 11.2 oz (54.3 kg), SpO2 100 %.    Lymphatics: No cervical or supraclavicular nodes GI: No hepatosplenomegaly, no mass Vascular: No leg edema  Skin: Dryness over the trunk, no rash   Lab Results:  Lab Results  Component Value Date   WBC 5.3 01/31/2020   HGB 12.6 01/31/2020   HCT 38.8 01/31/2020   MCV 96.0 01/31/2020   PLT 158 01/31/2020   NEUTROABS 2.7 01/31/2020    CMP  Lab Results  Component Value Date   NA 141 01/31/2020   K 3.8 01/31/2020   CL 110 01/31/2020   CO2 21 (L) 01/31/2020   GLUCOSE 87 01/31/2020   BUN 16 01/31/2020   CREATININE 1.20 (H) 01/31/2020   CALCIUM 10.3 01/31/2020   PROT 7.7 01/31/2020   ALBUMIN 4.2 01/31/2020   AST 15 01/31/2020   ALT 11 01/31/2020   ALKPHOS 111 01/31/2020   BILITOT 0.4 01/31/2020   GFRNONAA 49 (L) 01/31/2020   GFRAA 57 (L) 01/31/2020    Medications: I have reviewed the patient's current medications.   Assessment/Plan: 1. Metastatic non-small cell lung cancer   CTabdomen/pelvis 06/12/2019-scattered soft tissue masses in left hepatic lobe, right kidney, and the right adrenal gland. Fullness of the left hilum suspicious for left hilar mass and small nodules at the right lung base. Also noted signs of right pyelonephritis.  CT chest 06/13/2019-large encasing soft tissue mass left hilum measuring at least 5.8 x 4.5 x 3.8 cm with narrowing of the left mainstem bronchus and left pulmonary artery. Near-total obstructive atelectasis of the left upper lobe with  a portion of the lingula remaining aerated. Multiple bilateral pulmonary nodules.  Biopsy left supraclavicular lymph node 06/15/2019-metastatic lung adenocarcinoma; immunohistochemistry positive for cytokeratin 7, TTF-1, PAX 8 (weak). Napsin-A and CDX-2negative.Foundation 1-microsatellite stable, tumor mutational burden 15, ATM alteration, KRAS G13D;tumor proportion score 100%  Brain CT 06/29/2019-negative for metastatic disease  Cycle 1 carboplatin/Alimta/pembrolizumab 06/29/2019  Cycle 2 carboplatin/Alimta/pembrolizumab 07/20/2019  Cycle 3 carboplatin/Alimta 08/10/2019, pembrolizumab held due to rash  Cycle 4 carboplatin/Alimta, Udenyca added, pembrolizumab held9/01/2019  Cycle 5 Alimta alone (carboplatin discontinued, pembrolizumab held)09/22/2019  CTs 10/06/2019-decrease in size of central left upper lobe pulmonary mass; bilateral pulmonary nodules of varying size some of which are irregular in shape; many of these are stable in the interval. Some of the right lung nodules have decreased/resolved since the prior study. Some new irregular nodules in the left lower lobe indeterminate but potentially infectious/inflammatory. Marked interval decrease in size of left hepatic and right renal metastases. Metastatic retroperitoneal lymphadenopathy in the abdomen has clearly decreased. Metastatic nodules in the right omentum and perirenal fat bilaterally have decreased/resolved. No new or progressive findings.  Cycle 6 Alimta/pembrolizumab 10/17/2019  Cycle 7 Alimta/pembrolizumab 11/08/2019  Cycle 8 Alimta/pembrolizumab   Cycle 9 Pembrolizumab 12/19/2019 (Alimta held due to elevated creatinine)  Cycle 10 Pembrolizumab 01/10/2020 (Alimta held due to borderline creatinine)  CTs 01/26/2020-decreased left hilar soft tissue, persistent mass effect on the left upper lobe pulmonary artery, medial left upper lobe pulmonary lesion has decreased  in size with further reexpansion of the left upper  lobe, 5 mm right upper lobe nodule more conspicuous, new spiculated nodular densities in the left lower lobe on previous exam have almost completely resolved, decreased left hepatic lesion, decreased right renal lesion, resolved right adrenal lesion decreased retroperitoneal lymphadenopathy  Cycle 11 Alimta/pembrolizumab 01/31/2020 2. Anemia 3.Urinary tract infection 4. 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 Nausea 6. Constipation 7.Right abdominal pain secondary to #1 8.Skin rashbilateral forearms, grade 2, likely related to pembrolizumab7/22/2020; grade 3 08/03/2019, prednisone initiated; rash improved 08/10/2019, prednisone decreased to 10 mg daily x7 days then discontinued   Disposition: [name] appears stable.  The restaging CTs are consistent with a continued response to therapy.  Reviewed the CT images with [name].  The plan is to continue Alimta/pembrolizumab.  Alimta will be resumed today as the creatinine has stabilized.  Alimta will be dose reduced.  She will return for an office visit and treatment in 3 weeks.  I recommended she discontinue naproxen.  She says Tylenol does not help her chronic back pain.  I prescribed low-dose tramadol.  Betsy Coder, MD  01/31/2020  12:26 PM

## 2020-02-01 ENCOUNTER — Telehealth: Payer: Self-pay | Admitting: Oncology

## 2020-02-01 NOTE — Telephone Encounter (Signed)
Scheduled per los. Called and left msg. Mailed printout  °

## 2020-02-03 ENCOUNTER — Telehealth: Payer: Self-pay | Admitting: *Deleted

## 2020-02-03 NOTE — Telephone Encounter (Signed)
Pt called stating she has had 2 days of nvd. Advised pt to take prescribed nausea meds and imodium as well as pushing fluids, and if conditions continue to call office. Pt verbalized understanding.

## 2020-02-13 MED FILL — FOLIC ACID 1 MG TABS: 1 | 30 days supply | Qty: 30 | Fill #1

## 2020-02-18 ENCOUNTER — Other Ambulatory Visit: Payer: Self-pay | Admitting: Oncology

## 2020-02-21 ENCOUNTER — Other Ambulatory Visit: Payer: Self-pay

## 2020-02-21 ENCOUNTER — Encounter: Payer: Self-pay | Admitting: Nurse Practitioner

## 2020-02-21 ENCOUNTER — Inpatient Hospital Stay: Payer: Medicaid Other

## 2020-02-21 ENCOUNTER — Inpatient Hospital Stay (HOSPITAL_BASED_OUTPATIENT_CLINIC_OR_DEPARTMENT_OTHER): Payer: Medicaid Other | Admitting: Nurse Practitioner

## 2020-02-21 VITALS — BP 129/68 | HR 89 | Temp 98.7°F | Resp 18 | Ht 64.0 in | Wt 116.6 lb

## 2020-02-21 DIAGNOSIS — C3492 Malignant neoplasm of unspecified part of left bronchus or lung: Secondary | ICD-10-CM

## 2020-02-21 DIAGNOSIS — Z5111 Encounter for antineoplastic chemotherapy: Secondary | ICD-10-CM | POA: Diagnosis not present

## 2020-02-21 LAB — CBC WITH DIFFERENTIAL (CANCER CENTER ONLY)
Abs Immature Granulocytes: 0.01 10*3/uL (ref 0.00–0.07)
Basophils Absolute: 0.1 10*3/uL (ref 0.0–0.1)
Basophils Relative: 1 %
Eosinophils Absolute: 0.3 10*3/uL (ref 0.0–0.5)
Eosinophils Relative: 5 %
HCT: 35.1 % — ABNORMAL LOW (ref 36.0–46.0)
Hemoglobin: 11.4 g/dL — ABNORMAL LOW (ref 12.0–15.0)
Immature Granulocytes: 0 %
Lymphocytes Relative: 35 %
Lymphs Abs: 2 10*3/uL (ref 0.7–4.0)
MCH: 31.6 pg (ref 26.0–34.0)
MCHC: 32.5 g/dL (ref 30.0–36.0)
MCV: 97.2 fL (ref 80.0–100.0)
Monocytes Absolute: 0.7 10*3/uL (ref 0.1–1.0)
Monocytes Relative: 12 %
Neutro Abs: 2.6 10*3/uL (ref 1.7–7.7)
Neutrophils Relative %: 47 %
Platelet Count: 328 10*3/uL (ref 150–400)
RBC: 3.61 MIL/uL — ABNORMAL LOW (ref 3.87–5.11)
RDW: 13.9 % (ref 11.5–15.5)
WBC Count: 5.6 10*3/uL (ref 4.0–10.5)
nRBC: 0 % (ref 0.0–0.2)

## 2020-02-21 LAB — CMP (CANCER CENTER ONLY)
ALT: 11 U/L (ref 0–44)
AST: 16 U/L (ref 15–41)
Albumin: 4.1 g/dL (ref 3.5–5.0)
Alkaline Phosphatase: 101 U/L (ref 38–126)
Anion gap: 10 (ref 5–15)
BUN: 16 mg/dL (ref 6–20)
CO2: 24 mmol/L (ref 22–32)
Calcium: 10.2 mg/dL (ref 8.9–10.3)
Chloride: 107 mmol/L (ref 98–111)
Creatinine: 1.06 mg/dL — ABNORMAL HIGH (ref 0.44–1.00)
GFR, Est AFR Am: >60 mL/min (ref >60)
GFR, Estimated: 57 mL/min — ABNORMAL LOW (ref >60)
Glucose, Bld: 74 mg/dL (ref 70–99)
Potassium: 3.9 mmol/L (ref 3.5–5.1)
Sodium: 141 mmol/L (ref 135–145)
Total Bilirubin: 0.4 mg/dL (ref 0.3–1.2)
Total Protein: 7.5 g/dL (ref 6.5–8.1)

## 2020-02-21 MED ORDER — PROCHLORPERAZINE EDISYLATE 10 MG/2ML IJ SOLN
INTRAMUSCULAR | Status: AC
  Filled 2020-02-21: qty 2

## 2020-02-21 MED ORDER — SODIUM CHLORIDE 0.9 % IV SOLN
Freq: Once | INTRAVENOUS | Status: AC
  Filled 2020-02-21: qty 250

## 2020-02-21 MED ORDER — SODIUM CHLORIDE 0.9 % IV SOLN
400.0000 mg/m2 | Freq: Once | INTRAVENOUS | Status: AC
  Administered 2020-02-21: 16:00:00 600 mg via INTRAVENOUS
  Filled 2020-02-21: qty 20

## 2020-02-21 MED ORDER — CYANOCOBALAMIN 1000 MCG/ML IJ SOLN
INTRAMUSCULAR | Status: AC
  Filled 2020-02-21: qty 1

## 2020-02-21 MED ORDER — CYANOCOBALAMIN 1000 MCG/ML IJ SOLN
1000.0000 ug | Freq: Once | INTRAMUSCULAR | Status: AC
  Administered 2020-02-21: 15:00:00 1000 ug via INTRAMUSCULAR

## 2020-02-21 MED ORDER — PROCHLORPERAZINE MALEATE 10 MG PO TABS
10.0000 mg | ORAL_TABLET | Freq: Once | ORAL | Status: AC
  Administered 2020-02-21: 15:00:00 10 mg via ORAL

## 2020-02-21 MED ORDER — SODIUM CHLORIDE 0.9 % IV SOLN
200.0000 mg | Freq: Once | INTRAVENOUS | Status: AC
  Administered 2020-02-21: 15:00:00 200 mg via INTRAVENOUS
  Filled 2020-02-21: qty 8

## 2020-02-21 MED ORDER — PROCHLORPERAZINE MALEATE 10 MG PO TABS
ORAL_TABLET | ORAL | Status: AC
  Filled 2020-02-21: qty 1

## 2020-02-21 NOTE — Progress Notes (Signed)
[facility] OFFICE PROGRESS NOTE   Diagnosis: Non-small cell lung cancer  INTERVAL HISTORY:   [name] returns as scheduled.  She completed a cycle of Alimta/pembrolizumab 01/31/2020.  She developed nausea, vomiting, diarrhea 2 to 3 days after the treatment.  Symptoms lasted for several days.  At present she feels well.  No recent nausea, vomiting, diarrhea.  No skin rash.  No fever, cough, shortness of breath.  Objective:  Vital signs in last 24 hours:  Blood pressure 129/68, pulse 89, temperature 98.7 F (37.1 C), temperature source Oral, resp. rate 18, height 5' 4"  (1.626 m), weight 116 lb 9.6 oz (52.9 kg), SpO2 100 %.   Resp: Distant breath sounds.  No respiratory distress. Cardio: Regular rate and rhythm. GI: Abdomen soft and nontender.  No hepatomegaly. Vascular: No leg edema. Neuro: Alert and oriented. Skin: No rash.   Lab Results:  Lab Results  Component Value Date   WBC 5.6 02/21/2020   HGB 11.4 (L) 02/21/2020   HCT 35.1 (L) 02/21/2020   MCV 97.2 02/21/2020   PLT 328 02/21/2020   NEUTROABS 2.6 02/21/2020    Imaging:  No results found.  Medications: I have reviewed the patient's current medications.  Assessment/Plan: 1. Metastatic non-small cell lung cancer   CTabdomen/pelvis 06/12/2019-scattered soft tissue masses in left hepatic lobe, right kidney, and the right adrenal gland. Fullness of the left hilum suspicious for left hilar mass and small nodules at the right lung base. Also noted signs of right pyelonephritis.  CT chest 06/13/2019-large encasing soft tissue mass left hilum measuring at least 5.8 x 4.5 x 3.8 cm with narrowing of the left mainstem bronchus and left pulmonary artery. Near-total obstructive atelectasis of the left upper lobe with a portion of the lingula remaining aerated. Multiple bilateral pulmonary nodules.  Biopsy left supraclavicular lymph node 06/15/2019-metastatic lung adenocarcinoma; immunohistochemistry  positive for cytokeratin 7, TTF-1, PAX 8 (weak). Napsin-A and CDX-2negative.Foundation 1-microsatellite stable, tumor mutational burden 15, ATM alteration, KRAS G13D;tumor proportion score 100%  Brain CT 06/29/2019-negative for metastatic disease  Cycle 1 carboplatin/Alimta/pembrolizumab 06/29/2019  Cycle 2 carboplatin/Alimta/pembrolizumab 07/20/2019  Cycle 3 carboplatin/Alimta 08/10/2019, pembrolizumab held due to rash  Cycle 4 carboplatin/Alimta, Udenyca added, pembrolizumab held9/01/2019  Cycle 5 Alimta alone (carboplatin discontinued, pembrolizumab held)09/22/2019  CTs 10/06/2019-decrease in size of central left upper lobe pulmonary mass; bilateral pulmonary nodules of varying size some of which are irregular in shape; many of these are stable in the interval. Some of the right lung nodules have decreased/resolved since the prior study. Some new irregular nodules in the left lower lobe indeterminate but potentially infectious/inflammatory. Marked interval decrease in size of left hepatic and right renal metastases. Metastatic retroperitoneal lymphadenopathy in the abdomen has clearly decreased. Metastatic nodules in the right omentum and perirenal fat bilaterally have decreased/resolved. No new or progressive findings.  Cycle 6 Alimta/pembrolizumab 10/17/2019  Cycle 7 Alimta/pembrolizumab 11/08/2019  Cycle 8 Alimta/pembrolizumab   Cycle 9 Pembrolizumab 12/19/2019 (Alimta held due to elevated creatinine)  Cycle 10 Pembrolizumab 01/10/2020 (Alimta held due to borderline creatinine)  CTs 01/26/2020-decreased left hilar soft tissue, persistent mass effect on the left upper lobe pulmonary artery, medial left upper lobe pulmonary lesion has decreased in size with further reexpansion of the left upper lobe, 5 mm right upper lobe nodule more conspicuous, new spiculated nodular densities in the left lower lobe on previous exam have almost completely resolved, decreased left hepatic lesion,  decreased right renal lesion, resolved right adrenal lesion decreased retroperitoneal lymphadenopathy  Cycle 11 Alimta/pembrolizumab  01/31/2020  Cycle 12 Alimta/pembrolizumab 02/21/2020 2. Anemia 3.Urinary tract infection 4. 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 Nausea 6. Constipation 7.Right abdominal pain secondary to #1 8.Skin rashbilateral forearms, grade 2, likely related to pembrolizumab7/22/2020; grade 3 08/03/2019, prednisone initiated; rash improved 08/10/2019, prednisone decreased to 10 mg daily x7 days then discontinued   Disposition: [name] appears stable.  There is no clinical evidence of disease progression.  Plan to continue Alimta/pembrolizumab, treatment today.  We reviewed the CBC and chemistry panel from today.  Labs are adequate to proceed with treatment.  She will return for lab, follow-up, Alimta/pembrolizumab in 3 weeks.  She will contact the office in the interim with any problems.  We specifically discussed recurrent nausea/vomiting/diarrhea.    Ned Card ANP/GNP-BC   02/21/2020  1:58 PM

## 2020-02-21 NOTE — Patient Instructions (Signed)
[facility] Discharge Instructions for Patients Receiving Chemotherapy  Today you received the following chemotherapy agents: Pembrolizumab (Keytruda) and Pemetrexed (Alimta)  To help prevent nausea and vomiting after your treatment, we encourage you to take your nausea medication as directed.   If you develop nausea and vomiting that is not controlled by your nausea medication, call the clinic.   BELOW ARE SYMPTOMS THAT SHOULD BE REPORTED IMMEDIATELY:  *FEVER GREATER THAN 100.5 F  *CHILLS WITH OR WITHOUT FEVER  NAUSEA AND VOMITING THAT IS NOT CONTROLLED WITH YOUR NAUSEA MEDICATION  *UNUSUAL SHORTNESS OF BREATH  *UNUSUAL BRUISING OR BLEEDING  TENDERNESS IN MOUTH AND THROAT WITH OR WITHOUT PRESENCE OF ULCERS  *URINARY PROBLEMS  *BOWEL PROBLEMS  UNUSUAL RASH Items with * indicate a potential emergency and should be followed up as soon as possible.  Feel free to call the clinic should you have any questions or concerns. The clinic phone number is [phone].  Please show the [facility] at check-in to the Emergency Department and triage nurse.  Coronavirus (COVID-19) Are you at risk?  Are you at risk for the Coronavirus (COVID-19)?  To be considered HIGH RISK for Coronavirus (COVID-19), you have to meet the following criteria:  . Traveled to [place], [place], [place], [place] or [place]; or in the [place] to [place], [place], [place], or [place]; and have fever, cough, and shortness of breath within the last 2 weeks of travel OR . Been in close contact with a person diagnosed with COVID-19 within the last 2 weeks and have fever, cough, and shortness of breath . IF YOU DO NOT MEET THESE CRITERIA, YOU ARE CONSIDERED LOW RISK FOR COVID-19.  What to do if you are HIGH RISK for COVID-19?  Marland Kitchen If you are having a medical emergency, call 911. . Seek medical care right away. Before you go to a doctor's office, urgent care or emergency department,  call ahead and tell them about your recent travel, contact with someone diagnosed with COVID-19, and your symptoms. You should receive instructions from your physician's office regarding next steps of care.  . When you arrive at healthcare provider, tell the healthcare staff immediately you have returned from visiting [place], [place], [place], [place] or [place]; or traveled in the [place] to [place], [place], [place], or [place]; in the last two weeks or you have been in close contact with a person diagnosed with COVID-19 in the last 2 weeks.   . Tell the health care staff about your symptoms: fever, cough and shortness of breath. . After you have been seen by a medical provider, you will be either: o Tested for (COVID-19) and discharged home on quarantine except to seek medical care if symptoms worsen, and asked to  - Stay home and avoid contact with others until you get your results (4-5 days)  - Avoid travel on public transportation if possible (such as bus, train, or airplane) or o Sent to the Emergency Department by EMS for evaluation, COVID-19 testing, and possible admission depending on your condition and test results.  What to do if you are LOW RISK for COVID-19?  Reduce your risk of any infection by using the same precautions used for avoiding the common cold or flu:  Marland Kitchen Wash your hands often with soap and warm water for at least 20 seconds.  If soap and water are not readily available, use an alcohol-based hand sanitizer with at least 60% alcohol.  Marland Kitchen  If coughing or sneezing, cover your mouth and nose by coughing or sneezing into the elbow areas of your shirt or coat, into a tissue or into your sleeve (not your hands). . Avoid shaking hands with others and consider head nods or verbal greetings only. . Avoid touching your eyes, nose, or mouth with unwashed hands.  . Avoid close contact with people who are sick. . Avoid places or events with large numbers of people in one  location, like concerts or sporting events. . Carefully consider travel plans you have or are making. . If you are planning any travel outside or inside the [place], visit the CDC's Travelers' Health webpage for the latest health notices. . If you have some symptoms but not all symptoms, continue to monitor at home and seek medical attention if your symptoms worsen. . If you are having a medical emergency, call 911.   [place] / e-Visit: [email or web address]         [facility]: [facility]: [phone]                   [facility]: [phone]

## 2020-02-22 ENCOUNTER — Telehealth: Payer: Self-pay | Admitting: Oncology

## 2020-02-22 NOTE — Telephone Encounter (Signed)
Scheduled 3/16 appt per 2/23 los. Left voicemail with appt details and mailed out a reminder letter and calendar.

## 2020-02-24 ENCOUNTER — Telehealth: Payer: Self-pay | Admitting: *Deleted

## 2020-02-24 DIAGNOSIS — C3492 Malignant neoplasm of unspecified part of left bronchus or lung: Secondary | ICD-10-CM

## 2020-02-24 MED ORDER — ONDANSETRON HCL 4 MG PO TABS: 4.0000 mg | ORAL_TABLET | Freq: Three times a day (TID) | ORAL | 1 refills | Status: DC | PRN

## 2020-02-24 MED FILL — ONDANSETRON HCL 4 MG TABLET: 4 | 10 days supply | Qty: 30 | Fill #0

## 2020-02-24 NOTE — Telephone Encounter (Signed)
Called to report she had nausea w/vomiting and diarrhea beginning 02/23/20 (tx 2/23). Today since lunch symptoms are better--still some mild nausea. Has been taking compazine every 6 hours, but not using the zofran. Encouraged her to use both--alternate meds. Refill called to [facility]. Asking if she should take Imodium or Pepto Bismol for diarrhea. Instructed to take the Imodium and call if not resolved in 24 hours. She agrees to do so and to push clear liquids till resolved.

## 2020-03-11 ENCOUNTER — Other Ambulatory Visit: Payer: Self-pay | Admitting: Oncology

## 2020-03-13 ENCOUNTER — Other Ambulatory Visit: Payer: Medicaid Other

## 2020-03-13 ENCOUNTER — Ambulatory Visit: Payer: Medicaid Other

## 2020-03-13 ENCOUNTER — Inpatient Hospital Stay: Payer: Medicaid Other | Attending: Nurse Practitioner

## 2020-03-13 ENCOUNTER — Inpatient Hospital Stay: Payer: Medicaid Other

## 2020-03-13 ENCOUNTER — Ambulatory Visit: Payer: Medicaid Other | Admitting: Oncology

## 2020-03-13 ENCOUNTER — Encounter: Payer: Self-pay | Admitting: Nurse Practitioner

## 2020-03-13 ENCOUNTER — Other Ambulatory Visit: Payer: Self-pay

## 2020-03-13 ENCOUNTER — Inpatient Hospital Stay (HOSPITAL_BASED_OUTPATIENT_CLINIC_OR_DEPARTMENT_OTHER): Payer: Medicaid Other | Admitting: Nurse Practitioner

## 2020-03-13 VITALS — BP 142/84 | HR 93 | Temp 98.7°F | Resp 18 | Ht 64.0 in | Wt 116.8 lb

## 2020-03-13 DIAGNOSIS — C3412 Malignant neoplasm of upper lobe, left bronchus or lung: Secondary | ICD-10-CM | POA: Diagnosis not present

## 2020-03-13 DIAGNOSIS — C3492 Malignant neoplasm of unspecified part of left bronchus or lung: Secondary | ICD-10-CM | POA: Diagnosis not present

## 2020-03-13 DIAGNOSIS — D649 Anemia, unspecified: Secondary | ICD-10-CM | POA: Diagnosis not present

## 2020-03-13 DIAGNOSIS — Z5111 Encounter for antineoplastic chemotherapy: Secondary | ICD-10-CM | POA: Insufficient documentation

## 2020-03-13 DIAGNOSIS — C7902 Secondary malignant neoplasm of left kidney and renal pelvis: Secondary | ICD-10-CM | POA: Insufficient documentation

## 2020-03-13 DIAGNOSIS — K5900 Constipation, unspecified: Secondary | ICD-10-CM | POA: Insufficient documentation

## 2020-03-13 DIAGNOSIS — Z79899 Other long term (current) drug therapy: Secondary | ICD-10-CM | POA: Insufficient documentation

## 2020-03-13 DIAGNOSIS — N390 Urinary tract infection, site not specified: Secondary | ICD-10-CM | POA: Diagnosis not present

## 2020-03-13 DIAGNOSIS — R590 Localized enlarged lymph nodes: Secondary | ICD-10-CM | POA: Insufficient documentation

## 2020-03-13 DIAGNOSIS — C770 Secondary and unspecified malignant neoplasm of lymph nodes of head, face and neck: Secondary | ICD-10-CM | POA: Insufficient documentation

## 2020-03-13 LAB — CBC WITH DIFFERENTIAL (CANCER CENTER ONLY)
Abs Immature Granulocytes: 0.02 10*3/uL (ref 0.00–0.07)
Basophils Absolute: 0.1 10*3/uL (ref 0.0–0.1)
Basophils Relative: 1 %
Eosinophils Absolute: 0.3 10*3/uL (ref 0.0–0.5)
Eosinophils Relative: 4 %
HCT: 36.9 % (ref 36.0–46.0)
Hemoglobin: 12 g/dL (ref 12.0–15.0)
Immature Granulocytes: 0 %
Lymphocytes Relative: 27 %
Lymphs Abs: 1.8 10*3/uL (ref 0.7–4.0)
MCH: 31.7 pg (ref 26.0–34.0)
MCHC: 32.5 g/dL (ref 30.0–36.0)
MCV: 97.4 fL (ref 80.0–100.0)
Monocytes Absolute: 0.7 10*3/uL (ref 0.1–1.0)
Monocytes Relative: 11 %
Neutro Abs: 3.7 10*3/uL (ref 1.7–7.7)
Neutrophils Relative %: 57 %
Platelet Count: 218 10*3/uL (ref 150–400)
RBC: 3.79 MIL/uL — ABNORMAL LOW (ref 3.87–5.11)
RDW: 14.8 % (ref 11.5–15.5)
WBC Count: 6.4 10*3/uL (ref 4.0–10.5)
nRBC: 0.5 % — ABNORMAL HIGH (ref 0.0–0.2)

## 2020-03-13 LAB — CMP (CANCER CENTER ONLY)
ALT: 11 U/L (ref 0–44)
AST: 18 U/L (ref 15–41)
Albumin: 3.9 g/dL (ref 3.5–5.0)
Alkaline Phosphatase: 105 U/L (ref 38–126)
Anion gap: 11 (ref 5–15)
BUN: 10 mg/dL (ref 6–20)
CO2: 21 mmol/L — ABNORMAL LOW (ref 22–32)
Calcium: 10.4 mg/dL — ABNORMAL HIGH (ref 8.9–10.3)
Chloride: 110 mmol/L (ref 98–111)
Creatinine: 1.09 mg/dL — ABNORMAL HIGH (ref 0.44–1.00)
GFR, Est AFR Am: >60 mL/min (ref >60)
GFR, Estimated: 56 mL/min — ABNORMAL LOW (ref >60)
Glucose, Bld: 87 mg/dL (ref 70–99)
Potassium: 3.9 mmol/L (ref 3.5–5.1)
Sodium: 142 mmol/L (ref 135–145)
Total Bilirubin: 0.3 mg/dL (ref 0.3–1.2)
Total Protein: 7.4 g/dL (ref 6.5–8.1)

## 2020-03-13 LAB — TSH: TSH: 2.183 u[IU]/mL (ref 0.308–3.960)

## 2020-03-13 MED ORDER — PALONOSETRON HCL INJECTION 0.25 MG/5ML
INTRAVENOUS | Status: AC
  Filled 2020-03-13: qty 5

## 2020-03-13 MED ORDER — SODIUM CHLORIDE 0.9 % IV SOLN
200.0000 mg | Freq: Once | INTRAVENOUS | Status: AC
  Administered 2020-03-13: 200 mg via INTRAVENOUS
  Filled 2020-03-13: qty 8

## 2020-03-13 MED ORDER — SODIUM CHLORIDE 0.9 % IV SOLN
400.0000 mg/m2 | Freq: Once | INTRAVENOUS | Status: AC
  Administered 2020-03-13: 16:00:00 600 mg via INTRAVENOUS
  Filled 2020-03-13: qty 20

## 2020-03-13 MED ORDER — SODIUM CHLORIDE 0.9 % IV SOLN
Freq: Once | INTRAVENOUS | Status: AC
  Filled 2020-03-13: qty 250

## 2020-03-13 MED ORDER — PALONOSETRON HCL INJECTION 0.25 MG/5ML
0.2500 mg | Freq: Once | INTRAVENOUS | Status: AC
  Administered 2020-03-13: 15:00:00 0.25 mg via INTRAVENOUS

## 2020-03-13 MED ORDER — DIPHENOXYLATE-ATROPINE 2.5-0.025 MG PO TABS: 1.0000 | ORAL_TABLET | Freq: Four times a day (QID) | ORAL | 0 refills | Status: DC | PRN

## 2020-03-13 MED FILL — DIPHENOXYLATE-ATROPINE 2.5-: 2.5-0.025 | 7 days supply | Qty: 30 | Fill #0

## 2020-03-13 NOTE — Progress Notes (Signed)
[facility] OFFICE PROGRESS NOTE   Diagnosis: Non-small cell lung cancer  INTERVAL HISTORY:   Barbara Watkins returns as scheduled.  She completed a cycle of Alimta/pembrolizumab 02/21/2020.  On day 2 she again developed nausea/vomiting/diarrhea.  Symptoms lasted for about 3 days.  She noted some relief alternating Compazine and Zofran.  Imodium did not help significantly.  She was able to push fluids until the symptoms resolved.  No mouth sores.  No rash.  She denies pain.  No shortness of breath.  No cough or fever.  Objective:  Vital signs in last 24 hours:  Blood pressure (!) 142/84, pulse 93, temperature 98.7 F (37.1 C), temperature source Temporal, resp. rate 18, height '5\' 4"'$  (1.626 m), weight 116 lb 12.8 oz (53 kg), SpO2 100 %.    HEENT: No thrush or ulcers. Resp: Lungs clear bilaterally. Cardio: Regular rate and rhythm. GI: Abdomen soft and nontender.  No hepatomegaly. Vascular: No leg edema. Skin: No rash.   Lab Results:  Lab Results  Component Value Date   WBC 6.4 03/13/2020   HGB 12.0 03/13/2020   HCT 36.9 03/13/2020   MCV 97.4 03/13/2020   PLT 218 03/13/2020   NEUTROABS 3.7 03/13/2020    Imaging:  No results found.  Medications: I have reviewed the patient's current medications.  Assessment/Plan: 1.Metastatic non-small cell lung cancer   CTabdomen/pelvis 06/12/2019-scattered soft tissue masses in left hepatic lobe, right kidney, and the right adrenal gland. Fullness of the left hilum suspicious for left hilar mass and small nodules at the right lung base. Also noted signs of right pyelonephritis.  CT chest 06/13/2019-large encasing soft tissue mass left hilum measuring at least 5.8 x 4.5 x 3.8 cm with narrowing of the left mainstem bronchus and left pulmonary artery. Near-total obstructive atelectasis of the left upper lobe with a portion of the lingula remaining aerated. Multiple bilateral pulmonary nodules.  Biopsy left  supraclavicular lymph node 06/15/2019-metastatic lung adenocarcinoma; immunohistochemistry positive for cytokeratin 7, TTF-1, PAX 8 (weak). Napsin-A and CDX-2negative.Foundation 1-microsatellite stable, tumor mutational burden 15, ATM alteration, KRAS G13D;tumor proportion score 100%  Brain CT 06/29/2019-negative for metastatic disease  Cycle 1 carboplatin/Alimta/pembrolizumab 06/29/2019  Cycle 2 carboplatin/Alimta/pembrolizumab 07/20/2019  Cycle 3 carboplatin/Alimta 08/10/2019, pembrolizumab held due to rash  Cycle 4 carboplatin/Alimta, Udenyca added, pembrolizumab held9/01/2019  Cycle 5 Alimta alone (carboplatin discontinued, pembrolizumab held)09/22/2019  CTs 10/06/2019-decrease in size of central left upper lobe pulmonary mass; bilateral pulmonary nodules of varying size some of which are irregular in shape; many of these are stable in the interval. Some of the right lung nodules have decreased/resolved since the prior study. Some new irregular nodules in the left lower lobe indeterminate but potentially infectious/inflammatory. Marked interval decrease in size of left hepatic and right renal metastases. Metastatic retroperitoneal lymphadenopathy in the abdomen has clearly decreased. Metastatic nodules in the right omentum and perirenal fat bilaterally have decreased/resolved. No new or progressive findings.  Cycle 6 Alimta/pembrolizumab 10/17/2019  Cycle 7 Alimta/pembrolizumab 11/08/2019  Cycle 8 Alimta/pembrolizumab   Cycle 9 Pembrolizumab 12/19/2019 (Alimta held due to elevated creatinine)  Cycle 10 Pembrolizumab 01/10/2020 (Alimta held due to borderline creatinine)  CTs 01/26/2020-decreased left hilar soft tissue, persistent mass effect on the left upper lobe pulmonary artery, medial left upper lobe pulmonary lesion has decreased in size with further reexpansion of the left upper lobe, 5 mm right upper lobe nodule more conspicuous, new spiculated nodular densities in the left  lower lobe on previous exam have almost completely resolved, decreased left hepatic  lesion, decreased right renal lesion, resolved right adrenal lesion decreased retroperitoneal lymphadenopathy  Cycle 11 Alimta/pembrolizumab 01/31/2020  Cycle 12 Alimta/pembrolizumab 02/21/2020  Cycle 13 Alimta/pembrolizumab 03/13/2020, Aloxi added for delayed nausea 2. Anemia 3.Urinary tract infection 4. 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 Nausea 6. Constipation 7.Right abdominal pain secondary to #1 8.Skin rashbilateral forearms, grade 2, likely related to pembrolizumab7/22/2020; grade 3 08/03/2019, prednisone initiated; rash improved 08/10/2019, prednisone decreased to 10 mg daily x7 days then discontinued  Disposition: Barbara Watkins appears stable.  She has completed 12 cycles of Alimta/pembrolizumab.  She again had delayed nausea/vomiting and diarrhea beginning day 2 and lasting for about 3 days.  We decided to add Aloxi as a premedication and will send a prescription to her pharmacy for Lomotil as needed.  She understands to contact the office if these changes do not effectively relieve her symptoms.  We reviewed the CBC from today.  Counts adequate to proceed.  She will return for lab, follow-up, Alimta/pembrolizumab in 3 weeks.  She will contact the office in the interim as outlined above or with any other problems.  Plan reviewed with Dr. Benay Spice.    Ned Card ANP/GNP-BC   03/13/2020  1:31 PM

## 2020-03-13 NOTE — Patient Instructions (Signed)
[facility] Discharge Instructions for Patients Receiving Chemotherapy  Today you received the following chemotherapy agents:  pembrolizumab Beryle Flock), pemetrexed (Alimta)  To help prevent nausea and vomiting after your treatment, we encourage you to take your nausea medication as prescribed.   If you develop nausea and vomiting that is not controlled by your nausea medication, call the clinic.   BELOW ARE SYMPTOMS THAT SHOULD BE REPORTED IMMEDIATELY:  *FEVER GREATER THAN 100.5 F  *CHILLS WITH OR WITHOUT FEVER  NAUSEA AND VOMITING THAT IS NOT CONTROLLED WITH YOUR NAUSEA MEDICATION  *UNUSUAL SHORTNESS OF BREATH  *UNUSUAL BRUISING OR BLEEDING  TENDERNESS IN MOUTH AND THROAT WITH OR WITHOUT PRESENCE OF ULCERS  *URINARY PROBLEMS  *BOWEL PROBLEMS  UNUSUAL RASH Items with * indicate a potential emergency and should be followed up as soon as possible.  Feel free to call the clinic should you have any questions or concerns. The clinic phone number is [phone].  Please show the [facility] at check-in to the Emergency Department and triage nurse.

## 2020-03-16 ENCOUNTER — Telehealth: Payer: Self-pay | Admitting: Oncology

## 2020-03-16 NOTE — Telephone Encounter (Signed)
Scheduled per los. Called and left msg. Mailed printout  °

## 2020-03-22 MED FILL — FOLIC ACID 1 MG TABS: 1 | 30 days supply | Qty: 30 | Fill #2

## 2020-03-27 NOTE — Progress Notes (Signed)
Pharmacist Chemotherapy Monitoring - Follow Up Assessment    I verify that I have reviewed each item in the below checklist:  . Regimen for the patient is scheduled for the appropriate day and plan matches scheduled date. Marland Kitchen Appropriate non-routine labs are ordered dependent on drug ordered. . If applicable, additional medications reviewed and ordered per protocol based on lifetime cumulative doses and/or treatment regimen.   Plan for follow-up and/or issues identified: No . I-vent associated with next due treatment: Yes . MD and/or nursing notified: No  Acquanetta Belling 03/27/2020 3:08 PM

## 2020-04-01 ENCOUNTER — Other Ambulatory Visit: Payer: Self-pay | Admitting: Oncology

## 2020-04-02 ENCOUNTER — Inpatient Hospital Stay: Payer: Medicaid Other | Attending: Nurse Practitioner

## 2020-04-02 ENCOUNTER — Inpatient Hospital Stay (HOSPITAL_BASED_OUTPATIENT_CLINIC_OR_DEPARTMENT_OTHER): Payer: Medicaid Other | Admitting: Nurse Practitioner

## 2020-04-02 ENCOUNTER — Other Ambulatory Visit: Payer: Self-pay

## 2020-04-02 ENCOUNTER — Encounter: Payer: Self-pay | Admitting: Nurse Practitioner

## 2020-04-02 ENCOUNTER — Inpatient Hospital Stay: Payer: Medicaid Other

## 2020-04-02 VITALS — BP 140/89 | HR 100 | Temp 98.7°F | Resp 18 | Ht 64.0 in | Wt 115.6 lb

## 2020-04-02 DIAGNOSIS — N390 Urinary tract infection, site not specified: Secondary | ICD-10-CM | POA: Diagnosis not present

## 2020-04-02 DIAGNOSIS — C770 Secondary and unspecified malignant neoplasm of lymph nodes of head, face and neck: Secondary | ICD-10-CM | POA: Insufficient documentation

## 2020-04-02 DIAGNOSIS — Z5111 Encounter for antineoplastic chemotherapy: Secondary | ICD-10-CM | POA: Insufficient documentation

## 2020-04-02 DIAGNOSIS — Z79899 Other long term (current) drug therapy: Secondary | ICD-10-CM | POA: Diagnosis not present

## 2020-04-02 DIAGNOSIS — K5900 Constipation, unspecified: Secondary | ICD-10-CM | POA: Diagnosis not present

## 2020-04-02 DIAGNOSIS — C7902 Secondary malignant neoplasm of left kidney and renal pelvis: Secondary | ICD-10-CM | POA: Diagnosis not present

## 2020-04-02 DIAGNOSIS — D649 Anemia, unspecified: Secondary | ICD-10-CM | POA: Diagnosis not present

## 2020-04-02 DIAGNOSIS — C3492 Malignant neoplasm of unspecified part of left bronchus or lung: Secondary | ICD-10-CM

## 2020-04-02 DIAGNOSIS — R590 Localized enlarged lymph nodes: Secondary | ICD-10-CM | POA: Diagnosis not present

## 2020-04-02 DIAGNOSIS — C3412 Malignant neoplasm of upper lobe, left bronchus or lung: Secondary | ICD-10-CM | POA: Diagnosis not present

## 2020-04-02 LAB — CBC WITH DIFFERENTIAL (CANCER CENTER ONLY)
Abs Immature Granulocytes: 0.01 10*3/uL (ref 0.00–0.07)
Basophils Absolute: 0.1 10*3/uL (ref 0.0–0.1)
Basophils Relative: 1 %
Eosinophils Absolute: 0.3 10*3/uL (ref 0.0–0.5)
Eosinophils Relative: 6 %
HCT: 35.2 % — ABNORMAL LOW (ref 36.0–46.0)
Hemoglobin: 11.4 g/dL — ABNORMAL LOW (ref 12.0–15.0)
Immature Granulocytes: 0 %
Lymphocytes Relative: 31 %
Lymphs Abs: 1.8 10*3/uL (ref 0.7–4.0)
MCH: 31.8 pg (ref 26.0–34.0)
MCHC: 32.4 g/dL (ref 30.0–36.0)
MCV: 98.3 fL (ref 80.0–100.0)
Monocytes Absolute: 0.5 10*3/uL (ref 0.1–1.0)
Monocytes Relative: 8 %
Neutro Abs: 3.2 10*3/uL (ref 1.7–7.7)
Neutrophils Relative %: 54 %
Platelet Count: 205 10*3/uL (ref 150–400)
RBC: 3.58 MIL/uL — ABNORMAL LOW (ref 3.87–5.11)
RDW: 15.7 % — ABNORMAL HIGH (ref 11.5–15.5)
WBC Count: 5.8 10*3/uL (ref 4.0–10.5)
nRBC: 0 % (ref 0.0–0.2)

## 2020-04-02 LAB — CMP (CANCER CENTER ONLY)
ALT: 15 U/L (ref 0–44)
AST: 19 U/L (ref 15–41)
Albumin: 3.8 g/dL (ref 3.5–5.0)
Alkaline Phosphatase: 99 U/L (ref 38–126)
Anion gap: 10 (ref 5–15)
BUN: 16 mg/dL (ref 6–20)
CO2: 21 mmol/L — ABNORMAL LOW (ref 22–32)
Calcium: 9.8 mg/dL (ref 8.9–10.3)
Chloride: 111 mmol/L (ref 98–111)
Creatinine: 1.12 mg/dL — ABNORMAL HIGH (ref 0.44–1.00)
GFR, Est AFR Am: >60 mL/min (ref >60)
GFR, Estimated: 53 mL/min — ABNORMAL LOW (ref >60)
Glucose, Bld: 125 mg/dL — ABNORMAL HIGH (ref 70–99)
Potassium: 3.8 mmol/L (ref 3.5–5.1)
Sodium: 142 mmol/L (ref 135–145)
Total Bilirubin: 0.4 mg/dL (ref 0.3–1.2)
Total Protein: 7.4 g/dL (ref 6.5–8.1)

## 2020-04-02 LAB — TSH: TSH: 2.356 u[IU]/mL (ref 0.308–3.960)

## 2020-04-02 MED ORDER — SODIUM CHLORIDE 0.9 % IV SOLN
Freq: Once | INTRAVENOUS | Status: AC
  Filled 2020-04-02: qty 250

## 2020-04-02 MED ORDER — SODIUM CHLORIDE 0.9 % IV SOLN
200.0000 mg | Freq: Once | INTRAVENOUS | Status: AC
  Administered 2020-04-02: 200 mg via INTRAVENOUS
  Filled 2020-04-02: qty 8

## 2020-04-02 MED ORDER — PALONOSETRON HCL INJECTION 0.25 MG/5ML
0.2500 mg | Freq: Once | INTRAVENOUS | Status: AC
  Administered 2020-04-02: 15:00:00 0.25 mg via INTRAVENOUS

## 2020-04-02 MED ORDER — SODIUM CHLORIDE 0.9 % IV SOLN
400.0000 mg/m2 | Freq: Once | INTRAVENOUS | Status: AC
  Administered 2020-04-02: 600 mg via INTRAVENOUS
  Filled 2020-04-02: qty 4

## 2020-04-02 MED ORDER — SODIUM CHLORIDE 0.9 % IV SOLN
150.0000 mg | Freq: Once | INTRAVENOUS | Status: AC
  Administered 2020-04-02: 150 mg via INTRAVENOUS
  Filled 2020-04-02: qty 150

## 2020-04-02 MED ORDER — PALONOSETRON HCL INJECTION 0.25 MG/5ML
INTRAVENOUS | Status: AC
  Filled 2020-04-02: qty 5

## 2020-04-02 NOTE — Patient Instructions (Signed)
[facility] Discharge Instructions for Patients Receiving Chemotherapy  Today you received the following chemotherapy agents Pembrolizumab (KEYTRUDA) & Pemetrexed (ALIMTA).  To help prevent nausea and vomiting after your treatment, we encourage you to take your nausea medication as prescribed.   If you develop nausea and vomiting that is not controlled by your nausea medication, call the clinic.   BELOW ARE SYMPTOMS THAT SHOULD BE REPORTED IMMEDIATELY:  *FEVER GREATER THAN 100.5 F  *CHILLS WITH OR WITHOUT FEVER  NAUSEA AND VOMITING THAT IS NOT CONTROLLED WITH YOUR NAUSEA MEDICATION  *UNUSUAL SHORTNESS OF BREATH  *UNUSUAL BRUISING OR BLEEDING  TENDERNESS IN MOUTH AND THROAT WITH OR WITHOUT PRESENCE OF ULCERS  *URINARY PROBLEMS  *BOWEL PROBLEMS  UNUSUAL RASH Items with * indicate a potential emergency and should be followed up as soon as possible.  Feel free to call the clinic should you have any questions or concerns. The clinic phone number is [phone].  Please show the [place] at check-in to the Emergency Department and triage nurse.

## 2020-04-02 NOTE — Progress Notes (Signed)
[facility] OFFICE PROGRESS NOTE   Diagnosis: Non-small cell lung cancer  INTERVAL HISTORY:   Barbara Watkins returns as scheduled.  She completed a cycle of Alimta/pembrolizumab 03/13/2020.  She received Aloxi as a premedication due to delayed nausea/vomiting with the previous 2 cycles of treatment.  She again had delayed nausea/vomiting but not as severe.  No mouth sores.  No diarrhea.  No skin rash.  She denies fever, cough, shortness of breath.  She has had recent allergy symptoms including itchy eyes and sneezing.  Objective:  Vital signs in last 24 hours:  Blood pressure 140/89, pulse 100, temperature 98.7 F (37.1 C), temperature source Temporal, resp. rate 18, height 5' 4"  (1.626 m), weight 115 lb 9.6 oz (52.4 kg), SpO2 100 %.    HEENT: No thrush or ulcers. Resp: Lungs clear bilaterally. Cardio: Regular rate and rhythm. GI: Abdomen soft and nontender.  No hepatomegaly. Vascular: No leg edema. Neuro: Alert and oriented. Skin: No rash.   Lab Results:  Lab Results  Component Value Date   WBC 5.8 04/02/2020   HGB 11.4 (L) 04/02/2020   HCT 35.2 (L) 04/02/2020   MCV 98.3 04/02/2020   PLT 205 04/02/2020   NEUTROABS 3.2 04/02/2020    Imaging:  No results found.  Medications: I have reviewed the patient's current medications.  Assessment/Plan: 1.Metastatic non-small cell lung cancer   CTabdomen/pelvis 06/12/2019-scattered soft tissue masses in left hepatic lobe, right kidney, and the right adrenal gland. Fullness of the left hilum suspicious for left hilar mass and small nodules at the right lung base. Also noted signs of right pyelonephritis.  CT chest 06/13/2019-large encasing soft tissue mass left hilum measuring at least 5.8 x 4.5 x 3.8 cm with narrowing of the left mainstem bronchus and left pulmonary artery. Near-total obstructive atelectasis of the left upper lobe with a portion of the lingula remaining aerated. Multiple bilateral pulmonary  nodules.  Biopsy left supraclavicular lymph node 06/15/2019-metastatic lung adenocarcinoma; immunohistochemistry positive for cytokeratin 7, TTF-1, PAX 8 (weak). Napsin-A and CDX-2negative.Foundation 1-microsatellite stable, tumor mutational burden 15, ATM alteration, KRAS G13D;tumor proportion score 100%  Brain CT 06/29/2019-negative for metastatic disease  Cycle 1 carboplatin/Alimta/pembrolizumab 06/29/2019  Cycle 2 carboplatin/Alimta/pembrolizumab 07/20/2019  Cycle 3 carboplatin/Alimta 08/10/2019, pembrolizumab held due to rash  Cycle 4 carboplatin/Alimta, Udenyca added, pembrolizumab held9/01/2019  Cycle 5 Alimta alone (carboplatin discontinued, pembrolizumab held)09/22/2019  CTs 10/06/2019-decrease in size of central left upper lobe pulmonary mass; bilateral pulmonary nodules of varying size some of which are irregular in shape; many of these are stable in the interval. Some of the right lung nodules have decreased/resolved since the prior study. Some new irregular nodules in the left lower lobe indeterminate but potentially infectious/inflammatory. Marked interval decrease in size of left hepatic and right renal metastases. Metastatic retroperitoneal lymphadenopathy in the abdomen has clearly decreased. Metastatic nodules in the right omentum and perirenal fat bilaterally have decreased/resolved. No new or progressive findings.  Cycle 6 Alimta/pembrolizumab 10/17/2019  Cycle 7 Alimta/pembrolizumab 11/08/2019  Cycle 8 Alimta/pembrolizumab   Cycle 9 Pembrolizumab 12/19/2019 (Alimta held due to elevated creatinine)  Cycle 10 Pembrolizumab 01/10/2020 (Alimta held due to borderline creatinine)  CTs 01/26/2020-decreased left hilar soft tissue, persistent mass effect on the left upper lobe pulmonary artery, medial left upper lobe pulmonary lesion has decreased in size with further reexpansion of the left upper lobe, 5 mm right upper lobe nodule more conspicuous, new spiculated nodular  densities in the left lower lobe on previous exam have almost completely resolved, decreased  left hepatic lesion, decreased right renal lesion, resolved right adrenal lesion decreased retroperitoneal lymphadenopathy  Cycle 11 Alimta/pembrolizumab 01/31/2020  Cycle 12 Alimta/pembrolizumab 02/21/2020  Cycle 13 Alimta/pembrolizumab 03/13/2020, Aloxi added for delayed nausea  Cycle 14 Alimta/pembrolizumab 04/02/2020, Aloxi plus Emend 2. Anemia 3.Urinary tract infection 4. 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 Nausea 6. Constipation 7.Right abdominal pain secondary to #1 8.Skin rashbilateral forearms, grade 2, likely related to pembrolizumab7/22/2020; grade 3 08/03/2019, prednisone initiated; rash improved 08/10/2019, prednisone decreased to 10 mg daily x7 days then discontinued  Disposition: [name] appears stable.  She has completed 13 cycles of Alimta/pembrolizumab.  There is no clinical evidence of disease progression.  Plan to proceed with cycle 14 today as scheduled.  She is experiencing delayed nausea, partially improved with the Aloxi.  We will add Emend today.  We reviewed the CBC from today.  Counts adequate to proceed with treatment.  She will return for lab, follow-up, Alimta/pembrolizumab in 3 weeks.  She will contact the office in the interim with any problems.  Plan reviewed with Dr. Benay Spice.    Ned Card ANP/GNP-BC   04/02/2020  2:20 PM

## 2020-04-03 ENCOUNTER — Other Ambulatory Visit: Payer: Medicaid Other

## 2020-04-03 ENCOUNTER — Ambulatory Visit: Payer: Medicaid Other | Admitting: Oncology

## 2020-04-03 ENCOUNTER — Ambulatory Visit: Payer: Medicaid Other

## 2020-04-09 ENCOUNTER — Telehealth: Payer: Self-pay | Admitting: Oncology

## 2020-04-09 NOTE — Telephone Encounter (Signed)
Scheduled per los. Called and spoke with patient. Confirmed appt 

## 2020-04-23 MED FILL — FOLIC ACID 1 MG TABS: 1 | 30 days supply | Qty: 30 | Fill #3

## 2020-04-23 NOTE — Progress Notes (Signed)
Pharmacist Chemotherapy Monitoring - Follow Up Assessment    I verify that I have reviewed each item in the below checklist:  . Regimen for the patient is scheduled for the appropriate day and plan matches scheduled date. Marland Kitchen Appropriate non-routine labs are ordered dependent on drug ordered. . If applicable, additional medications reviewed and ordered per protocol based on lifetime cumulative doses and/or treatment regimen.   Plan for follow-up and/or issues identified: No . I-vent associated with next due treatment: No . MD and/or nursing notified: No  Barbara Watkins 04/23/2020 2:29 PM

## 2020-04-27 ENCOUNTER — Inpatient Hospital Stay (HOSPITAL_BASED_OUTPATIENT_CLINIC_OR_DEPARTMENT_OTHER): Payer: Medicaid Other | Admitting: Nurse Practitioner

## 2020-04-27 ENCOUNTER — Encounter: Payer: Self-pay | Admitting: Nurse Practitioner

## 2020-04-27 ENCOUNTER — Inpatient Hospital Stay: Payer: Medicaid Other

## 2020-04-27 ENCOUNTER — Other Ambulatory Visit: Payer: Self-pay

## 2020-04-27 VITALS — BP 141/82 | HR 107 | Temp 98.7°F | Resp 20 | Ht 64.0 in | Wt 115.1 lb

## 2020-04-27 VITALS — HR 87

## 2020-04-27 DIAGNOSIS — C3492 Malignant neoplasm of unspecified part of left bronchus or lung: Secondary | ICD-10-CM

## 2020-04-27 DIAGNOSIS — Z5111 Encounter for antineoplastic chemotherapy: Secondary | ICD-10-CM | POA: Diagnosis not present

## 2020-04-27 LAB — CMP (CANCER CENTER ONLY)
ALT: 15 U/L (ref 0–44)
AST: 20 U/L (ref 15–41)
Albumin: 4.1 g/dL (ref 3.5–5.0)
Alkaline Phosphatase: 110 U/L (ref 38–126)
Anion gap: 12 (ref 5–15)
BUN: 19 mg/dL (ref 6–20)
CO2: 23 mmol/L (ref 22–32)
Calcium: 10.7 mg/dL — ABNORMAL HIGH (ref 8.9–10.3)
Chloride: 108 mmol/L (ref 98–111)
Creatinine: 1.43 mg/dL — ABNORMAL HIGH (ref 0.44–1.00)
GFR, Est AFR Am: 46 mL/min — ABNORMAL LOW (ref >60)
GFR, Estimated: 40 mL/min — ABNORMAL LOW (ref >60)
Glucose, Bld: 92 mg/dL (ref 70–99)
Potassium: 3.9 mmol/L (ref 3.5–5.1)
Sodium: 143 mmol/L (ref 135–145)
Total Bilirubin: 0.5 mg/dL (ref 0.3–1.2)
Total Protein: 7.9 g/dL (ref 6.5–8.1)

## 2020-04-27 LAB — CBC WITH DIFFERENTIAL (CANCER CENTER ONLY)
Abs Immature Granulocytes: 0.01 10*3/uL (ref 0.00–0.07)
Basophils Absolute: 0.1 10*3/uL (ref 0.0–0.1)
Basophils Relative: 2 %
Eosinophils Absolute: 0.3 10*3/uL (ref 0.0–0.5)
Eosinophils Relative: 5 %
HCT: 36.9 % (ref 36.0–46.0)
Hemoglobin: 12 g/dL (ref 12.0–15.0)
Immature Granulocytes: 0 %
Lymphocytes Relative: 34 %
Lymphs Abs: 1.8 10*3/uL (ref 0.7–4.0)
MCH: 32.2 pg (ref 26.0–34.0)
MCHC: 32.5 g/dL (ref 30.0–36.0)
MCV: 98.9 fL (ref 80.0–100.0)
Monocytes Absolute: 0.5 10*3/uL (ref 0.1–1.0)
Monocytes Relative: 9 %
Neutro Abs: 2.6 10*3/uL (ref 1.7–7.7)
Neutrophils Relative %: 50 %
Platelet Count: 299 10*3/uL (ref 150–400)
RBC: 3.73 MIL/uL — ABNORMAL LOW (ref 3.87–5.11)
RDW: 15.9 % — ABNORMAL HIGH (ref 11.5–15.5)
WBC Count: 5.2 10*3/uL (ref 4.0–10.5)
nRBC: 0 % (ref 0.0–0.2)

## 2020-04-27 LAB — TSH: TSH: 2.918 u[IU]/mL (ref 0.308–3.960)

## 2020-04-27 MED ORDER — CYANOCOBALAMIN 1000 MCG/ML IJ SOLN
INTRAMUSCULAR | Status: AC
  Filled 2020-04-27: qty 1

## 2020-04-27 MED ORDER — SODIUM CHLORIDE 0.9 % IV SOLN
Freq: Once | INTRAVENOUS | Status: AC
  Filled 2020-04-27: qty 250

## 2020-04-27 MED ORDER — CYANOCOBALAMIN 1000 MCG/ML IJ SOLN
1000.0000 ug | Freq: Once | INTRAMUSCULAR | Status: AC
  Administered 2020-04-27: 1000 ug via INTRAMUSCULAR

## 2020-04-27 MED ORDER — SODIUM CHLORIDE 0.9 % IV SOLN
200.0000 mg | Freq: Once | INTRAVENOUS | Status: AC
  Administered 2020-04-27: 200 mg via INTRAVENOUS
  Filled 2020-04-27: qty 8

## 2020-04-27 NOTE — Patient Instructions (Signed)
[facility] Discharge Instructions for Patients Receiving Chemotherapy  Today you received the following chemotherapy agents Pembrolizumab [facility]) To help prevent nausea and vomiting after your treatment, we encourage you to take your nausea medication as prescribed.   If you develop nausea and vomiting that is not controlled by your nausea medication, call the clinic.   BELOW ARE SYMPTOMS THAT SHOULD BE REPORTED IMMEDIATELY:  *FEVER GREATER THAN 100.5 F  *CHILLS WITH OR WITHOUT FEVER  NAUSEA AND VOMITING THAT IS NOT CONTROLLED WITH YOUR NAUSEA MEDICATION  *UNUSUAL SHORTNESS OF BREATH  *UNUSUAL BRUISING OR BLEEDING  TENDERNESS IN MOUTH AND THROAT WITH OR WITHOUT PRESENCE OF ULCERS  *URINARY PROBLEMS  *BOWEL PROBLEMS  UNUSUAL RASH Items with * indicate a potential emergency and should be followed up as soon as possible.  Feel free to call the clinic should you have any questions or concerns. The clinic phone number is [phone].  Please show the [place] at check-in to the Emergency Department and triage nurse.

## 2020-04-27 NOTE — Progress Notes (Addendum)
Barbara Watkins OFFICE PROGRESS NOTE   Diagnosis: Non-small cell lung cancer  INTERVAL HISTORY:   Barbara Watkins returns as scheduled.  She completed cycle 14 Alimta/pembrolizumab 04/02/2020.  She developed nausea/vomiting around day 3.  Symptoms lasted a shorter timeframe than with previous cycles.  No diarrhea.  No rash.  She denies shortness of breath.  No cough.  No fever.  She has had "watery eyes" for the past week.  She tried Claritin with no improvement.  Objective:  Vital signs in last 24 hours:  Blood pressure (!) 141/82, pulse (!) 107, temperature 98.7 F (37.1 C), temperature source Oral, resp. rate 20, height 5' 4"  (1.626 m), weight 115 lb 1.6 oz (52.2 kg), SpO2 99 %.    HEENT: No thrush or ulcers.  No conjunctival erythema. Resp: Lungs clear bilaterally. Cardio: Regular rate and rhythm. GI: Abdomen soft and nontender.  No hepatomegaly. Vascular: No leg edema. Neuro: Alert and oriented. Skin: No rash.   Lab Results:  Lab Results  Component Value Date   WBC 5.2 04/27/2020   HGB 12.0 04/27/2020   HCT 36.9 04/27/2020   MCV 98.9 04/27/2020   PLT 299 04/27/2020   NEUTROABS 2.6 04/27/2020    Imaging:  No results found.  Medications: I have reviewed the patient's current medications.  Assessment/Plan: 1.Metastatic non-small cell lung cancer   CTabdomen/pelvis 06/12/2019-scattered soft tissue masses in left hepatic lobe, right kidney, and the right adrenal gland. Fullness of the left hilum suspicious for left hilar mass and small nodules at the right lung base. Also noted signs of right pyelonephritis.  CT chest 06/13/2019-large encasing soft tissue mass left hilum measuring at least 5.8 x 4.5 x 3.8 cm with narrowing of the left mainstem bronchus and left pulmonary artery. Near-total obstructive atelectasis of the left upper lobe with a portion of the lingula remaining aerated. Multiple bilateral pulmonary nodules.  Biopsy left supraclavicular  lymph node 06/15/2019-metastatic lung adenocarcinoma; immunohistochemistry positive for cytokeratin 7, TTF-1, PAX 8 (weak). Napsin-A and CDX-2negative.Foundation 1-microsatellite stable, tumor mutational burden 15, ATM alteration, KRAS G13D;tumor proportion score 100%  Brain CT 06/29/2019-negative for metastatic disease  Cycle 1 carboplatin/Alimta/pembrolizumab 06/29/2019  Cycle 2 carboplatin/Alimta/pembrolizumab 07/20/2019  Cycle 3 carboplatin/Alimta 08/10/2019, pembrolizumab held due to rash  Cycle 4 carboplatin/Alimta, Udenyca added, pembrolizumab held9/01/2019  Cycle 5 Alimta alone (carboplatin discontinued, pembrolizumab held)09/22/2019  CTs 10/06/2019-decrease in size of central left upper lobe pulmonary mass; bilateral pulmonary nodules of varying size some of which are irregular in shape; many of these are stable in the interval. Some of the right lung nodules have decreased/resolved since the prior study. Some new irregular nodules in the left lower lobe indeterminate but potentially infectious/inflammatory. Marked interval decrease in size of left hepatic and right renal metastases. Metastatic retroperitoneal lymphadenopathy in the abdomen has clearly decreased. Metastatic nodules in the right omentum and perirenal fat bilaterally have decreased/resolved. No new or progressive findings.  Cycle 6 Alimta/pembrolizumab 10/17/2019  Cycle 7 Alimta/pembrolizumab 11/08/2019  Cycle 8 Alimta/pembrolizumab   Cycle 9 Pembrolizumab 12/19/2019 (Alimta held due to elevated creatinine)  Cycle 10 Pembrolizumab 01/10/2020 (Alimta held due to borderline creatinine)  CTs 01/26/2020-decreased left hilar soft tissue, persistent mass effect on the left upper lobe pulmonary artery, medial left upper lobe pulmonary lesion has decreased in size with further reexpansion of the left upper lobe, 5 mm right upper lobe nodule more conspicuous, new spiculated nodular densities in the left lower lobe on  previous exam have almost completely resolved, decreased left hepatic lesion, decreased  right renal lesion, resolved right adrenal lesion decreased retroperitoneal lymphadenopathy  Cycle 11 Alimta/pembrolizumab 01/31/2020  Cycle 12 Alimta/pembrolizumab 02/21/2020  Cycle 13 Alimta/pembrolizumab 03/13/2020, Aloxi added for delayed nausea  Cycle 14 Alimta/pembrolizumab 04/02/2020, Aloxi plus Emend  Cycle 15 pembrolizumab alone 04/27/2020 (Alimta held due to elevated creatinine) 2. Anemia 3.Urinary tract infection 4. 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 Nausea 6. Constipation 7.Right abdominal pain secondary to #1 8.Skin rashbilateral forearms, grade 2, likely related to pembrolizumab7/22/2020; grade 3 08/03/2019, prednisone initiated; rash improved 08/10/2019, prednisone decreased to 10 mg daily x7 days then discontinued  Disposition: Barbara Watkins appears stable.  She has completed 14 cycles of Alimta/pembrolizumab.  There is no clinical evidence of disease progression. She continues to have fairly significant delayed nausea despite Aloxi and Emend.  She has eye tearing.  Review of labs today shows mild elevation of the creatinine.  We decided to hold Alimta with today's treatment, proceed with pembrolizumab alone.  She agrees with this plan.    She will undergo restaging CTs after the next cycle of treatment.  She will return for lab, follow-up, Alimta/pembrolizumab in 3 weeks.  She will contact the office in the interim with any problems.  Patient seen with Dr. Benay Spice.     Ned Card ANP/GNP-BC   04/27/2020  12:33 PM This was a shared visit with Ned Card.  Barbara Watkins continues to have nausea and vomiting following chemotherapy.  The creatinine is more elevated today and she has developed tearing.  Alimta will be placed on hold.  She will complete a treatment with pembrolizumab today.  We encouraged her to obtain the COVID-19 vaccine.  [name], MD

## 2020-04-30 ENCOUNTER — Telehealth: Payer: Self-pay | Admitting: Nurse Practitioner

## 2020-04-30 NOTE — Telephone Encounter (Signed)
Scheduled per los. Called and spoke with patient. Confirmed appt 

## 2020-05-13 ENCOUNTER — Other Ambulatory Visit: Payer: Self-pay | Admitting: Oncology

## 2020-05-14 NOTE — Progress Notes (Signed)
Pharmacist Chemotherapy Monitoring - Follow Up Assessment    I verify that I have reviewed each item in the below checklist:  . Regimen for the patient is scheduled for the appropriate day and plan matches scheduled date. Marland Kitchen Appropriate non-routine labs are ordered dependent on drug ordered. . If applicable, additional medications reviewed and ordered per protocol based on lifetime cumulative doses and/or treatment regimen.   Plan for follow-up and/or issues identified: No . I-vent associated with next due treatment: No . MD and/or nursing notified: No  Barbara Watkins [facility] 05/14/2020 1:26 PM

## 2020-05-17 ENCOUNTER — Other Ambulatory Visit: Payer: Self-pay | Admitting: *Deleted

## 2020-05-17 DIAGNOSIS — C3492 Malignant neoplasm of unspecified part of left bronchus or lung: Secondary | ICD-10-CM

## 2020-05-17 MED FILL — Fosaprepitant Dimeglumine For IV Infusion 150 MG (Base Eq): INTRAVENOUS | Qty: 5 | Status: AC

## 2020-05-18 ENCOUNTER — Inpatient Hospital Stay (HOSPITAL_BASED_OUTPATIENT_CLINIC_OR_DEPARTMENT_OTHER): Payer: Medicaid Other | Admitting: Nurse Practitioner

## 2020-05-18 ENCOUNTER — Inpatient Hospital Stay: Payer: Medicaid Other | Attending: Nurse Practitioner

## 2020-05-18 ENCOUNTER — Other Ambulatory Visit: Payer: Medicaid Other

## 2020-05-18 ENCOUNTER — Ambulatory Visit: Payer: Medicaid Other

## 2020-05-18 ENCOUNTER — Encounter: Payer: Self-pay | Admitting: Nurse Practitioner

## 2020-05-18 ENCOUNTER — Inpatient Hospital Stay: Payer: Medicaid Other

## 2020-05-18 ENCOUNTER — Ambulatory Visit: Payer: Medicaid Other | Admitting: Oncology

## 2020-05-18 ENCOUNTER — Other Ambulatory Visit: Payer: Self-pay

## 2020-05-18 VITALS — BP 131/68 | HR 89 | Temp 97.3°F | Resp 17 | Ht 64.0 in | Wt 114.5 lb

## 2020-05-18 DIAGNOSIS — C3412 Malignant neoplasm of upper lobe, left bronchus or lung: Secondary | ICD-10-CM | POA: Diagnosis present

## 2020-05-18 DIAGNOSIS — C3492 Malignant neoplasm of unspecified part of left bronchus or lung: Secondary | ICD-10-CM

## 2020-05-18 DIAGNOSIS — Z5112 Encounter for antineoplastic immunotherapy: Secondary | ICD-10-CM | POA: Diagnosis not present

## 2020-05-18 LAB — CMP (CANCER CENTER ONLY)
ALT: 11 U/L (ref 0–44)
AST: 19 U/L (ref 15–41)
Albumin: 4.1 g/dL (ref 3.5–5.0)
Alkaline Phosphatase: 105 U/L (ref 38–126)
Anion gap: 10 (ref 5–15)
BUN: 19 mg/dL (ref 6–20)
CO2: 23 mmol/L (ref 22–32)
Calcium: 10.7 mg/dL — ABNORMAL HIGH (ref 8.9–10.3)
Chloride: 110 mmol/L (ref 98–111)
Creatinine: 1.26 mg/dL — ABNORMAL HIGH (ref 0.44–1.00)
GFR, Est AFR Am: 54 mL/min — ABNORMAL LOW (ref >60)
GFR, Estimated: 46 mL/min — ABNORMAL LOW (ref >60)
Glucose, Bld: 67 mg/dL — ABNORMAL LOW (ref 70–99)
Potassium: 3.9 mmol/L (ref 3.5–5.1)
Sodium: 143 mmol/L (ref 135–145)
Total Bilirubin: 0.4 mg/dL (ref 0.3–1.2)
Total Protein: 7.6 g/dL (ref 6.5–8.1)

## 2020-05-18 LAB — CBC WITH DIFFERENTIAL (CANCER CENTER ONLY)
Abs Immature Granulocytes: 0.01 10*3/uL (ref 0.00–0.07)
Basophils Absolute: 0.1 10*3/uL (ref 0.0–0.1)
Basophils Relative: 1 %
Eosinophils Absolute: 0.3 10*3/uL (ref 0.0–0.5)
Eosinophils Relative: 5 %
HCT: 38.8 % (ref 36.0–46.0)
Hemoglobin: 12.3 g/dL (ref 12.0–15.0)
Immature Granulocytes: 0 %
Lymphocytes Relative: 38 %
Lymphs Abs: 2.5 10*3/uL (ref 0.7–4.0)
MCH: 31.7 pg (ref 26.0–34.0)
MCHC: 31.7 g/dL (ref 30.0–36.0)
MCV: 100 fL (ref 80.0–100.0)
Monocytes Absolute: 0.5 10*3/uL (ref 0.1–1.0)
Monocytes Relative: 7 %
Neutro Abs: 3.2 10*3/uL (ref 1.7–7.7)
Neutrophils Relative %: 49 %
Platelet Count: 181 10*3/uL (ref 150–400)
RBC: 3.88 MIL/uL (ref 3.87–5.11)
RDW: 13.5 % (ref 11.5–15.5)
WBC Count: 6.5 10*3/uL (ref 4.0–10.5)
nRBC: 0 % (ref 0.0–0.2)

## 2020-05-18 MED ORDER — SODIUM CHLORIDE 0.9 % IV SOLN
200.0000 mg | Freq: Once | INTRAVENOUS | Status: AC
  Administered 2020-05-18: 200 mg via INTRAVENOUS
  Filled 2020-05-18: qty 8

## 2020-05-18 MED ORDER — SODIUM CHLORIDE 0.9 % IV SOLN
Freq: Once | INTRAVENOUS | Status: AC
  Filled 2020-05-18: qty 250

## 2020-05-18 MED ORDER — PALONOSETRON HCL INJECTION 0.25 MG/5ML
INTRAVENOUS | Status: AC
  Filled 2020-05-18: qty 5

## 2020-05-18 NOTE — Patient Instructions (Signed)
[place] Discharge Instructions for Patients Receiving Chemotherapy  Today you received the following chemotherapy agents Pembrolizumab [facility]) To help prevent nausea and vomiting after your treatment, we encourage you to take your nausea medication as prescribed.   If you develop nausea and vomiting that is not controlled by your nausea medication, call the clinic.   BELOW ARE SYMPTOMS THAT SHOULD BE REPORTED IMMEDIATELY:  *FEVER GREATER THAN 100.5 F  *CHILLS WITH OR WITHOUT FEVER  NAUSEA AND VOMITING THAT IS NOT CONTROLLED WITH YOUR NAUSEA MEDICATION  *UNUSUAL SHORTNESS OF BREATH  *UNUSUAL BRUISING OR BLEEDING  TENDERNESS IN MOUTH AND THROAT WITH OR WITHOUT PRESENCE OF ULCERS  *URINARY PROBLEMS  *BOWEL PROBLEMS  UNUSUAL RASH Items with * indicate a potential emergency and should be followed up as soon as possible.  Feel free to call the clinic should you have any questions or concerns. The clinic phone number is [phone].  Please show the [place] at check-in to the Emergency Department and triage nurse.

## 2020-05-18 NOTE — Progress Notes (Signed)
[facility] OFFICE PROGRESS NOTE   Diagnosis: Non-small cell lung cancer  INTERVAL HISTORY:   Barbara Watkins returns as scheduled.  She completed a cycle of pembrolizumab 04/27/2020.  Alimta was held due to elevation of the creatinine and tearing.  He had no nausea/vomiting.  No mouth sores.  No diarrhea.  The tearing has resolved.  She denies fever, cough, shortness of breath.  She denies pain.  She has a good appetite.  Objective:  Vital signs in last 24 hours:  Blood pressure 131/68, pulse 89, temperature (!) 97.3 F (36.3 C), temperature source Temporal, resp. rate 17, height _0  (1.626 m), weight 114 lb 8 oz (51.9 kg), SpO2 100 %.    HEENT: No thrush or ulcers. Resp: Distant breath sounds, scattered rhonchi at the lower lung fields.  No respiratory distress. Cardio: Regular rate and rhythm. GI: Abdomen soft and nontender.  No hepatomegaly. Vascular: No leg edema. Skin: No rash.   Lab Results:  Lab Results  Component Value Date   WBC 6.5 05/18/2020   HGB 12.3 05/18/2020   HCT 38.8 05/18/2020   MCV 100.0 05/18/2020   PLT 181 05/18/2020   NEUTROABS 3.2 05/18/2020    Imaging:  No results found.  Medications: I have reviewed the patient's current medications.  Assessment/Plan: 1.Metastatic non-small cell lung cancer   CTabdomen/pelvis 06/12/2019-scattered soft tissue masses in left hepatic lobe, right kidney, and the right adrenal gland. Fullness of the left hilum suspicious for left hilar mass and small nodules at the right lung base. Also noted signs of right pyelonephritis.  CT chest 06/13/2019-large encasing soft tissue mass left hilum measuring at least 5.8 x 4.5 x 3.8 cm with narrowing of the left mainstem bronchus and left pulmonary artery. Near-total obstructive atelectasis of the left upper lobe with a portion of the lingula remaining aerated. Multiple bilateral pulmonary nodules.  Biopsy left supraclavicular lymph node  06/15/2019-metastatic lung adenocarcinoma; immunohistochemistry positive for cytokeratin 7, TTF-1, PAX 8 (weak). Napsin-A and CDX-2negative.Foundation 1-microsatellite stable, tumor mutational burden 15, ATM alteration, KRAS G13D;tumor proportion score 100%  Brain CT 06/29/2019-negative for metastatic disease  Cycle 1 carboplatin/Alimta/pembrolizumab 06/29/2019  Cycle 2 carboplatin/Alimta/pembrolizumab 07/20/2019  Cycle 3 carboplatin/Alimta 08/10/2019, pembrolizumab held due to rash  Cycle 4 carboplatin/Alimta, Udenyca added, pembrolizumab held9/01/2019  Cycle 5 Alimta alone (carboplatin discontinued, pembrolizumab held)09/22/2019  CTs 10/06/2019-decrease in size of central left upper lobe pulmonary mass; bilateral pulmonary nodules of varying size some of which are irregular in shape; many of these are stable in the interval. Some of the right lung nodules have decreased/resolved since the prior study. Some new irregular nodules in the left lower lobe indeterminate but potentially infectious/inflammatory. Marked interval decrease in size of left hepatic and right renal metastases. Metastatic retroperitoneal lymphadenopathy in the abdomen has clearly decreased. Metastatic nodules in the right omentum and perirenal fat bilaterally have decreased/resolved. No new or progressive findings.  Cycle 6 Alimta/pembrolizumab 10/17/2019  Cycle 7 Alimta/pembrolizumab 11/08/2019  Cycle 8 Alimta/pembrolizumab   Cycle 9 Pembrolizumab 12/19/2019 (Alimta held due to elevated creatinine)  Cycle 10 Pembrolizumab 01/10/2020 (Alimta held due to borderline creatinine)  CTs 01/26/2020-decreased left hilar soft tissue, persistent mass effect on the left upper lobe pulmonary artery, medial left upper lobe pulmonary lesion has decreased in size with further reexpansion of the left upper lobe, 5 mm right upper lobe nodule more conspicuous, new spiculated nodular densities in the left lower lobe on previous exam  have almost completely resolved, decreased left hepatic lesion, decreased right renal  lesion, resolved right adrenal lesion decreased retroperitoneal lymphadenopathy  Cycle 11 Alimta/pembrolizumab 01/31/2020  Cycle 12 Alimta/pembrolizumab 02/21/2020  Cycle 13 Alimta/pembrolizumab 03/13/2020, Aloxi added for delayed nausea  Cycle 14 Alimta/pembrolizumab 04/02/2020, Aloxi plus Emend  Cycle 15 pembrolizumab alone 04/27/2020 (Alimta held due to elevated creatinine)  Cycle 16 pembrolizumab alone 05/18/2020 2. Anemia 3.Urinary tract infection 4. 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 Nausea 6. Constipation 7.Right abdominal pain secondary to #1 8.Skin rashbilateral forearms, grade 2, likely related to pembrolizumab7/22/2020; grade 3 08/03/2019, prednisone initiated; rash improved 08/10/2019, prednisone decreased to 10 mg daily x7 days then discontinued   Disposition: [name] appears stable.  She has completed 15 cycles of systemic therapy.  She received pembrolizumab alone on 04/27/2020.  She tolerated well and did not experience delayed nausea/vomiting.  The tearing has resolved.  Plan to proceed with pembrolizumab alone with treatment today.  She will undergo restaging CTs prior to her next visit.  We reviewed the CBC and chemistry panel from today.  Labs adequate to proceed with treatment.  Creatinine is better.  She has mild persistent hypercalcemia, etiology unclear.  We will check an ionized calcium and PTH with next lab draw.  She will return for follow-up in approximately 3 weeks.  She will contact the office in the interim with any problems.  Plan reviewed with Dr. Benay Spice.    Ned Card ANP/GNP-BC   05/18/2020  2:36 PM

## 2020-05-24 MED FILL — FOLIC ACID 1 MG TABS: 1 | 30 days supply | Qty: 30 | Fill #4

## 2020-05-31 NOTE — Progress Notes (Signed)
Pharmacist Chemotherapy Monitoring - Follow Up Assessment    I verify that I have reviewed each item in the below checklist:  . Regimen for the patient is scheduled for the appropriate day and plan matches scheduled date. Marland Kitchen Appropriate non-routine labs are ordered dependent on drug ordered. . If applicable, additional medications reviewed and ordered per protocol based on lifetime cumulative doses and/or treatment regimen.   Plan for follow-up and/or issues identified: No . I-vent associated with next due treatment: No . MD and/or nursing notified: No  Philomena Course 05/31/2020 8:47 AM

## 2020-06-03 ENCOUNTER — Other Ambulatory Visit: Payer: Self-pay | Admitting: Oncology

## 2020-06-05 ENCOUNTER — Inpatient Hospital Stay: Payer: Medicaid Other | Attending: Nurse Practitioner

## 2020-06-05 ENCOUNTER — Other Ambulatory Visit: Payer: Self-pay

## 2020-06-05 ENCOUNTER — Ambulatory Visit (HOSPITAL_COMMUNITY)
Admission: RE | Admit: 2020-06-05 | Discharge: 2020-06-05 | Disposition: A | Discharge status: Home / Self Care | Payer: Medicaid Other | Source: Ambulatory Visit | Attending: Nurse Practitioner | Admitting: Nurse Practitioner

## 2020-06-05 DIAGNOSIS — C787 Secondary malignant neoplasm of liver and intrahepatic bile duct: Secondary | ICD-10-CM | POA: Insufficient documentation

## 2020-06-05 DIAGNOSIS — K5900 Constipation, unspecified: Secondary | ICD-10-CM | POA: Insufficient documentation

## 2020-06-05 DIAGNOSIS — Z79899 Other long term (current) drug therapy: Secondary | ICD-10-CM | POA: Diagnosis not present

## 2020-06-05 DIAGNOSIS — N390 Urinary tract infection, site not specified: Secondary | ICD-10-CM | POA: Diagnosis not present

## 2020-06-05 DIAGNOSIS — C7902 Secondary malignant neoplasm of left kidney and renal pelvis: Secondary | ICD-10-CM | POA: Insufficient documentation

## 2020-06-05 DIAGNOSIS — D649 Anemia, unspecified: Secondary | ICD-10-CM | POA: Insufficient documentation

## 2020-06-05 DIAGNOSIS — Z5112 Encounter for antineoplastic immunotherapy: Secondary | ICD-10-CM | POA: Diagnosis present

## 2020-06-05 DIAGNOSIS — C3492 Malignant neoplasm of unspecified part of left bronchus or lung: Secondary | ICD-10-CM | POA: Diagnosis present

## 2020-06-05 DIAGNOSIS — R590 Localized enlarged lymph nodes: Secondary | ICD-10-CM | POA: Insufficient documentation

## 2020-06-05 DIAGNOSIS — C3412 Malignant neoplasm of upper lobe, left bronchus or lung: Secondary | ICD-10-CM | POA: Diagnosis present

## 2020-06-05 LAB — CBC WITH DIFFERENTIAL (CANCER CENTER ONLY)
Abs Immature Granulocytes: 0.01 10*3/uL (ref 0.00–0.07)
Basophils Absolute: 0.1 10*3/uL (ref 0.0–0.1)
Basophils Relative: 1 %
Eosinophils Absolute: 0.3 10*3/uL (ref 0.0–0.5)
Eosinophils Relative: 4 %
HCT: 38.8 % (ref 36.0–46.0)
Hemoglobin: 12.6 g/dL (ref 12.0–15.0)
Immature Granulocytes: 0 %
Lymphocytes Relative: 41 %
Lymphs Abs: 2.4 10*3/uL (ref 0.7–4.0)
MCH: 32 pg (ref 26.0–34.0)
MCHC: 32.5 g/dL (ref 30.0–36.0)
MCV: 98.5 fL (ref 80.0–100.0)
Monocytes Absolute: 0.5 10*3/uL (ref 0.1–1.0)
Monocytes Relative: 8 %
Neutro Abs: 2.7 10*3/uL (ref 1.7–7.7)
Neutrophils Relative %: 46 %
Platelet Count: 204 10*3/uL (ref 150–400)
RBC: 3.94 MIL/uL (ref 3.87–5.11)
RDW: 12.3 % (ref 11.5–15.5)
WBC Count: 5.9 10*3/uL (ref 4.0–10.5)
nRBC: 0 % (ref 0.0–0.2)

## 2020-06-05 LAB — CMP (CANCER CENTER ONLY)
ALT: 11 U/L (ref 0–44)
AST: 17 U/L (ref 15–41)
Albumin: 4.1 g/dL (ref 3.5–5.0)
Alkaline Phosphatase: 105 U/L (ref 38–126)
Anion gap: 8 (ref 5–15)
BUN: 21 mg/dL — ABNORMAL HIGH (ref 6–20)
CO2: 27 mmol/L (ref 22–32)
Calcium: 11.5 mg/dL — ABNORMAL HIGH (ref 8.9–10.3)
Chloride: 105 mmol/L (ref 98–111)
Creatinine: 1.28 mg/dL — ABNORMAL HIGH (ref 0.44–1.00)
GFR, Est AFR Am: 53 mL/min — ABNORMAL LOW (ref >60)
GFR, Estimated: 45 mL/min — ABNORMAL LOW (ref >60)
Glucose, Bld: 98 mg/dL (ref 70–99)
Potassium: 3.7 mmol/L (ref 3.5–5.1)
Sodium: 140 mmol/L (ref 135–145)
Total Bilirubin: 0.3 mg/dL (ref 0.3–1.2)
Total Protein: 7.6 g/dL (ref 6.5–8.1)

## 2020-06-05 MED ORDER — SODIUM CHLORIDE (PF) 0.9 % IJ SOLN
INTRAMUSCULAR | Status: AC
  Filled 2020-06-05: qty 50

## 2020-06-05 MED ORDER — IOHEXOL 300 MG/ML  SOLN
100.0000 mL | Freq: Once | INTRAMUSCULAR | Status: AC | PRN
  Administered 2020-06-05: 100 mL via INTRAVENOUS

## 2020-06-05 MED FILL — Fosaprepitant Dimeglumine For IV Infusion 150 MG (Base Eq): INTRAVENOUS | Qty: 5 | Status: AC

## 2020-06-06 ENCOUNTER — Encounter: Payer: Self-pay | Admitting: Nurse Practitioner

## 2020-06-06 ENCOUNTER — Inpatient Hospital Stay (HOSPITAL_BASED_OUTPATIENT_CLINIC_OR_DEPARTMENT_OTHER): Payer: Medicaid Other | Admitting: Nurse Practitioner

## 2020-06-06 ENCOUNTER — Other Ambulatory Visit: Payer: Self-pay

## 2020-06-06 ENCOUNTER — Inpatient Hospital Stay: Payer: Medicaid Other

## 2020-06-06 VITALS — BP 154/77 | HR 88 | Temp 97.6°F | Resp 18 | Ht 64.0 in | Wt 116.5 lb

## 2020-06-06 DIAGNOSIS — C3492 Malignant neoplasm of unspecified part of left bronchus or lung: Secondary | ICD-10-CM

## 2020-06-06 DIAGNOSIS — Z5112 Encounter for antineoplastic immunotherapy: Secondary | ICD-10-CM | POA: Diagnosis not present

## 2020-06-06 LAB — PTH, INTACT AND CALCIUM
Calcium, Total (PTH): 11 mg/dL — ABNORMAL HIGH (ref 8.7–10.3)
PTH: 19 pg/mL (ref 15–65)

## 2020-06-06 LAB — CALCIUM, IONIZED: Calcium, Ionized, Serum: 6.1 mg/dL — ABNORMAL HIGH (ref 4.5–5.6)

## 2020-06-06 MED ORDER — SODIUM CHLORIDE 0.9 % IV SOLN
Freq: Once | INTRAVENOUS | Status: AC
  Filled 2020-06-06: qty 250

## 2020-06-06 MED ORDER — HEPARIN SOD (PORK) LOCK FLUSH 100 UNIT/ML IV SOLN
500.0000 [IU] | Freq: Once | INTRAVENOUS | Status: DC | PRN
  Filled 2020-06-06: qty 5

## 2020-06-06 MED ORDER — SODIUM CHLORIDE 0.9 % IV SOLN
200.0000 mg | Freq: Once | INTRAVENOUS | Status: AC
  Administered 2020-06-06: 200 mg via INTRAVENOUS
  Filled 2020-06-06: qty 8

## 2020-06-06 MED ORDER — SODIUM CHLORIDE 0.9% FLUSH
10.0000 mL | INTRAVENOUS | Status: DC | PRN
  Filled 2020-06-06: qty 10

## 2020-06-06 NOTE — Patient Instructions (Signed)
[facility] Discharge Instructions for Patients Receiving Chemotherapy  Today you received the following chemotherapy agents:  Keytruda.  To help prevent nausea and vomiting after your treatment, we encourage you to take your nausea medication as directed.   If you develop nausea and vomiting that is not controlled by your nausea medication, call the clinic.   BELOW ARE SYMPTOMS THAT SHOULD BE REPORTED IMMEDIATELY:  *FEVER GREATER THAN 100.5 F  *CHILLS WITH OR WITHOUT FEVER  NAUSEA AND VOMITING THAT IS NOT CONTROLLED WITH YOUR NAUSEA MEDICATION  *UNUSUAL SHORTNESS OF BREATH  *UNUSUAL BRUISING OR BLEEDING  TENDERNESS IN MOUTH AND THROAT WITH OR WITHOUT PRESENCE OF ULCERS  *URINARY PROBLEMS  *BOWEL PROBLEMS  UNUSUAL RASH Items with * indicate a potential emergency and should be followed up as soon as possible.  Feel free to call the clinic should you have any questions or concerns. The clinic phone number is [phone].  Please show the CHEMO ALERT CARD at check-in to the Emergency Department and triage nurse.    

## 2020-06-06 NOTE — Progress Notes (Addendum)
[facility] OFFICE PROGRESS NOTE   Diagnosis: Non-small cell lung cancer  INTERVAL HISTORY:   Ms. Fitzgibbon returns as scheduled.  She completed a cycle of pembrolizumab 05/18/2020.  He denies nausea/vomiting.  No diarrhea.  No rash.  She denies shortness of breath.  No tearing.  She denies pain.  She has a good appetite.  Objective:  Vital signs in last 24 hours:  Blood pressure (!) 154/77, pulse 88, temperature 97.6 F (36.4 C), temperature source Temporal, resp. rate 18, height 5' 4" (1.626 m), weight 116 lb 8 oz (52.8 kg), SpO2 100 %.    Resp: Distant breath sounds.  No respiratory distress. Cardio: Regular rate and rhythm. GI: Abdomen soft and nontender.  No hepatomegaly. Vascular: No leg edema.  Skin: No rash.   Lab Results:  Lab Results  Component Value Date   WBC 5.9 06/05/2020   HGB 12.6 06/05/2020   HCT 38.8 06/05/2020   MCV 98.5 06/05/2020   PLT 204 06/05/2020   NEUTROABS 2.7 06/05/2020    Imaging:  CT Chest W Contrast  Result Date: 06/06/2020 CLINICAL DATA:  Restaging lung cancer.  Ongoing chemotherapy. EXAM: CT CHEST, ABDOMEN, AND PELVIS WITH CONTRAST TECHNIQUE: Multidetector CT imaging of the chest, abdomen and pelvis was performed following the standard protocol during bolus administration of intravenous contrast. CONTRAST:  17m OMNIPAQUE IOHEXOL 300 MG/ML  SOLN COMPARISON:  01/26/2020 FINDINGS: CT CHEST FINDINGS Cardiovascular: Normal heart size. No pericardial effusion. Aortic atherosclerosis. Lad and left circumflex coronary artery calcifications. Mediastinum/Nodes: Normal appearance of the thyroid gland. The trachea appears patent and is midline. Normal appearance of the esophagus. No supraclavicular, axillary, mediastinal, or hilar adenopathy. Lungs/Pleura: No pleural effusion. Mild to moderate changes of emphysema. Medial left upper lobe pulmonary lesion measures 3.9 x 2.6 cm, image 67/6. Previously (by my measurements) this measured the  same. Medial left apex nodular density measures 1.3 x 0.8 cm, image 38/6. Also unchanged in the interval. Scattered ill-defined nodular densities are again noted in both lungs: -posterolateral right upper lobe lung nodule measures 3 mm, image 53/6. Previously 5 mm. -index right middle lobe lung nodule measures 4 mm, image 98/6. Unchanged. -ground-glass attenuating nodule in the central left lower lobe measures 9 mm, image 82/6. Stable. No new sites of disease. Musculoskeletal: No chest wall mass or suspicious bone lesions identified. CT ABDOMEN PELVIS FINDINGS Hepatobiliary: Left hepatic lobe metastasis measures 5.2 x 5.2 cm, image 56/2. Previously 5.3 by 5.2 cm. 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 No new liver lesion. Gallbladder unremarkable. No biliary dilatation. Pancreas: Unremarkable. No pancreatic ductal dilatation or surrounding inflammatory changes. Spleen: Normal in size without focal abnormality. Adrenals/Urinary Tract: Normal appearance of the adrenal glands. Interpolar right kidney lesion measures 1.9 x 1.8 cm, image 69/2. Previously 2.0 x 2.0 cm. Left kidney is unremarkable. No hydronephrosis identified bilaterally. Urinary bladder is normal. Stomach/Bowel: Stomach is within normal limits. Appendix appears normal. No evidence of bowel wall thickening, distention, or inflammatory changes. Vascular/Lymphatic: Aortic atherosclerosis without evidence for aneurysm. Index retrocaval lymph node has resolved measures 4 mm short axis, image 68/2. Previously 8 mm. Index aortocaval node measures 4 mm, image 76/2. Unchanged. No new adenopathy. No pelvic adenopathy. Reproductive: Uterus and bilateral adnexa are unremarkable. Other: No abdominal wall hernia or abnormality. No abdominopelvic ascites. Musculoskeletal: No aggressive appearing lytic or sclerotic bone lesions. Marked degenerative disc disease at L4-5. IMPRESSION: 1. Overall,  stable exam. No significant change  in size of medial left upper lobe lung mass. 2. Unchanged appearance of left hepatic lobe metastasis. 3. Slight decrease in size of right kidney lesion. 4. Small pulmonary nodules including 9 mm sub solid lesion in the left lower lobe are stable to minimally decreased in the interval. 5. No new or progressive disease identified. 6. Emphysema and aortic atherosclerosis. Three vessel coronary artery calcifications noted. Aortic Atherosclerosis (ICD10-I70.0) and Emphysema (ICD10-J43.9). Electronically Signed   By: Kerby Moors M.D.   On: 06/06/2020 12:59   CT Abdomen Pelvis W Contrast  Result Date: 06/06/2020 CLINICAL DATA:  Restaging lung cancer.  Ongoing chemotherapy. EXAM: CT CHEST, ABDOMEN, AND PELVIS WITH CONTRAST TECHNIQUE: Multidetector CT imaging of the chest, abdomen and pelvis was performed following the standard protocol during bolus administration of intravenous contrast. CONTRAST:  133m OMNIPAQUE IOHEXOL 300 MG/ML  SOLN COMPARISON:  01/26/2020 FINDINGS: CT CHEST FINDINGS Cardiovascular: Normal heart size. No pericardial effusion. Aortic atherosclerosis. Lad and left circumflex coronary artery calcifications. Mediastinum/Nodes: Normal appearance of the thyroid gland. The trachea appears patent and is midline. Normal appearance of the esophagus. No supraclavicular, axillary, mediastinal, or hilar adenopathy. Lungs/Pleura: No pleural effusion. Mild to moderate changes of emphysema. Medial left upper lobe pulmonary lesion measures 3.9 x 2.6 cm, image 67/6. Previously (by my measurements) this measured the same. Medial left apex nodular density measures 1.3 x 0.8 cm, image 38/6. Also unchanged in the interval. Scattered ill-defined nodular densities are again noted in both lungs: -posterolateral right upper lobe lung nodule measures 3 mm, image 53/6. Previously 5 mm. -index right middle lobe lung nodule measures 4 mm, image 98/6. Unchanged. -ground-glass attenuating  nodule in the central left lower lobe measures 9 mm, image 82/6. Stable. No new sites of disease. Musculoskeletal: No chest wall mass or suspicious bone lesions identified. CT ABDOMEN PELVIS FINDINGS Hepatobiliary: Left hepatic lobe metastasis measures 5.2 x 5.2 cm, image 56/2. Previously 5.3 by 5.2 cm. 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 No new liver lesion. Gallbladder unremarkable. No biliary dilatation. Pancreas: Unremarkable. No pancreatic ductal dilatation or surrounding inflammatory changes. Spleen: Normal in size without focal abnormality. Adrenals/Urinary Tract: Normal appearance of the adrenal glands. Interpolar right kidney lesion measures 1.9 x 1.8 cm, image 69/2. Previously 2.0 x 2.0 cm. Left kidney is unremarkable. No hydronephrosis identified bilaterally. Urinary bladder is normal. Stomach/Bowel: Stomach is within normal limits. Appendix appears normal. No evidence of bowel wall thickening, distention, or inflammatory changes. Vascular/Lymphatic: Aortic atherosclerosis without evidence for aneurysm. Index retrocaval lymph node has resolved measures 4 mm short axis, image 68/2. Previously 8 mm. Index aortocaval node measures 4 mm, image 76/2. Unchanged. No new adenopathy. No pelvic adenopathy. Reproductive: Uterus and bilateral adnexa are unremarkable. Other: No abdominal wall hernia or abnormality. No abdominopelvic ascites. Musculoskeletal: No aggressive appearing lytic or sclerotic bone lesions. Marked degenerative disc disease at L4-5. IMPRESSION: 1. Overall, stable exam. No significant change in size of medial left upper lobe lung mass. 2. Unchanged appearance of left hepatic lobe metastasis. 3. Slight decrease in size of right kidney lesion. 4. Small pulmonary nodules including 9 mm sub solid lesion in the left lower lobe are stable to minimally decreased in the interval. 5. No new or progressive disease identified. 6.  Emphysema and aortic atherosclerosis. Three vessel coronary artery calcifications noted. Aortic Atherosclerosis (ICD10-I70.0) and Emphysema (ICD10-J43.9). Electronically Signed   By: TKerby MoorsM.D.   On: 06/06/2020 12:59    Medications:  I have reviewed the patient's current medications.  Assessment/Plan: 1.Metastatic non-small cell lung cancer   CTabdomen/pelvis 06/12/2019-scattered soft tissue masses in left hepatic lobe, right kidney, and the right adrenal gland. Fullness of the left hilum suspicious for left hilar mass and small nodules at the right lung base. Also noted signs of right pyelonephritis.  CT chest 06/13/2019-large encasing soft tissue mass left hilum measuring at least 5.8 x 4.5 x 3.8 cm with narrowing of the left mainstem bronchus and left pulmonary artery. Near-total obstructive atelectasis of the left upper lobe with a portion of the lingula remaining aerated. Multiple bilateral pulmonary nodules.  Biopsy left supraclavicular lymph node 06/15/2019-metastatic lung adenocarcinoma; immunohistochemistry positive for cytokeratin 7, TTF-1, PAX 8 (weak). Napsin-A and CDX-2negative.Foundation 1-microsatellite stable, tumor mutational burden 15, ATM alteration, KRAS G13D;tumor proportion score 100%  Brain CT 06/29/2019-negative for metastatic disease  Cycle 1 carboplatin/Alimta/pembrolizumab 06/29/2019  Cycle 2 carboplatin/Alimta/pembrolizumab 07/20/2019  Cycle 3 carboplatin/Alimta 08/10/2019, pembrolizumab held due to rash  Cycle 4 carboplatin/Alimta, Udenyca added, pembrolizumab held9/01/2019  Cycle 5 Alimta alone (carboplatin discontinued, pembrolizumab held)09/22/2019  CTs 10/06/2019-decrease in size of central left upper lobe pulmonary mass; bilateral pulmonary nodules of varying size some of which are irregular in shape; many of these are stable in the interval. Some of the right lung nodules have decreased/resolved since the prior study. Some new irregular  nodules in the left lower lobe indeterminate but potentially infectious/inflammatory. Marked interval decrease in size of left hepatic and right renal metastases. Metastatic retroperitoneal lymphadenopathy in the abdomen has clearly decreased. Metastatic nodules in the right omentum and perirenal fat bilaterally have decreased/resolved. No new or progressive findings.  Cycle 6 Alimta/pembrolizumab 10/17/2019  Cycle 7 Alimta/pembrolizumab 11/08/2019  Cycle 8 Alimta/pembrolizumab   Cycle 9 Pembrolizumab 12/19/2019 (Alimta held due to elevated creatinine)  Cycle 10 Pembrolizumab 01/10/2020 (Alimta held due to borderline creatinine)  CTs 01/26/2020-decreased left hilar soft tissue, persistent mass effect on the left upper lobe pulmonary artery, medial left upper lobe pulmonary lesion has decreased in size with further reexpansion of the left upper lobe, 5 mm right upper lobe nodule more conspicuous, new spiculated nodular densities in the left lower lobe on previous exam have almost completely resolved, decreased left hepatic lesion, decreased right renal lesion, resolved right adrenal lesion decreased retroperitoneal lymphadenopathy  Cycle 11 Alimta/pembrolizumab 01/31/2020  Cycle 12 Alimta/pembrolizumab 02/21/2020  Cycle 13 Alimta/pembrolizumab 03/13/2020, Aloxi added for delayed nausea  Cycle 14 Alimta/pembrolizumab 04/02/2020, Aloxi plus Emend  Cycle 15 pembrolizumab alone 04/27/2020 (Alimta held due to elevated creatinine)  Cycle 16 pembrolizumab alone 05/18/2020  CTs 06/05/2020-stable exam.  No significant change in size of medial left upper lobe lung mass.  Unchanged appearance of left hepatic lobe metastasis.  Slight decrease in size of right kidney lesion.  Small pulmonary nodules stable to minimally decreased.  No new or progressive disease. 2. Anemia 3.Urinary tract infection 4. 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 Nausea 6. Constipation 7.Right abdominal pain  secondary to #1 8.Skin rashbilateral forearms, grade 2, likely related to pembrolizumab7/22/2020; grade 3 08/03/2019, prednisone initiated; rash improved 08/10/2019, prednisone decreased to 10 mg daily x7 days then discontinued   Disposition: Ms. Innocent appears well.  Restaging CTs show stable disease.  She will continue pembrolizumab every 3 weeks.  Alimta remains on hold.  We reviewed the CBC and chemistry panel from yesterday.  Labs adequate to proceed with treatment today as scheduled.  Creatinine with stable mild elevation.  She has persistent hypercalcemia.  Ionized calcium is mildly elevated.  We will follow-up on the PTH.  She will return for lab, follow-up, pembrolizumab in 3 weeks.  She will contact the office in the interim with any problems.  Patient seen with Dr. Benay Spice.    Ned Card ANP/GNP-BC   06/06/2020  2:12 PM This was a shared visit with Ned Card.  I reviewed the CT images.  There is no evidence of disease progression.  The plan is to continue pembrolizumab.  The etiology of the hypercalcemia is unclear.  This could be related to toxicity from the pembrolizumab.  She appears asymptomatic.  We will monitor for symptoms and progression of hypercalcemia.  [name], MD

## 2020-06-08 ENCOUNTER — Telehealth: Payer: Self-pay | Admitting: Oncology

## 2020-06-08 NOTE — Telephone Encounter (Signed)
Scheduled per los. Called and left msg. Mailed printout  °

## 2020-06-20 NOTE — Progress Notes (Signed)
Pharmacist Chemotherapy Monitoring - Follow Up Assessment    I verify that I have reviewed each item in the below checklist:  . Regimen for the patient is scheduled for the appropriate day and plan matches scheduled date. Marland Kitchen Appropriate non-routine labs are ordered dependent on drug ordered. . If applicable, additional medications reviewed and ordered per protocol based on lifetime cumulative doses and/or treatment regimen.   Plan for follow-up and/or issues identified: Yes . I-vent associated with next due treatment: Yes . MD and/or nursing notified: No   Kennith Center, Pharm.D., CPP 06/20/2020@1 :34 PM

## 2020-06-24 ENCOUNTER — Other Ambulatory Visit: Payer: Self-pay | Admitting: Oncology

## 2020-06-25 MED FILL — FOLIC ACID 1 MG TABS: 1 | 30 days supply | Qty: 30 | Fill #5

## 2020-06-26 ENCOUNTER — Inpatient Hospital Stay: Payer: Medicaid Other

## 2020-06-26 ENCOUNTER — Inpatient Hospital Stay (HOSPITAL_BASED_OUTPATIENT_CLINIC_OR_DEPARTMENT_OTHER): Payer: Medicaid Other | Admitting: Nurse Practitioner

## 2020-06-26 ENCOUNTER — Other Ambulatory Visit: Payer: Self-pay

## 2020-06-26 ENCOUNTER — Encounter: Payer: Self-pay | Admitting: Nurse Practitioner

## 2020-06-26 VITALS — BP 133/74 | HR 85 | Temp 97.8°F | Resp 17 | Ht 64.0 in | Wt 115.3 lb

## 2020-06-26 DIAGNOSIS — C3492 Malignant neoplasm of unspecified part of left bronchus or lung: Secondary | ICD-10-CM

## 2020-06-26 DIAGNOSIS — Z5112 Encounter for antineoplastic immunotherapy: Secondary | ICD-10-CM | POA: Diagnosis not present

## 2020-06-26 LAB — CMP (CANCER CENTER ONLY)
ALT: 10 U/L (ref 0–44)
AST: 16 U/L (ref 15–41)
Albumin: 4.1 g/dL (ref 3.5–5.0)
Alkaline Phosphatase: 108 U/L (ref 38–126)
Anion gap: 9 (ref 5–15)
BUN: 23 mg/dL — ABNORMAL HIGH (ref 6–20)
CO2: 21 mmol/L — ABNORMAL LOW (ref 22–32)
Calcium: 10.8 mg/dL — ABNORMAL HIGH (ref 8.9–10.3)
Chloride: 107 mmol/L (ref 98–111)
Creatinine: 1.27 mg/dL — ABNORMAL HIGH (ref 0.44–1.00)
GFR, Est AFR Am: 53 mL/min — ABNORMAL LOW (ref >60)
GFR, Estimated: 46 mL/min — ABNORMAL LOW (ref >60)
Glucose, Bld: 82 mg/dL (ref 70–99)
Potassium: 4 mmol/L (ref 3.5–5.1)
Sodium: 137 mmol/L (ref 135–145)
Total Bilirubin: 0.4 mg/dL (ref 0.3–1.2)
Total Protein: 7.6 g/dL (ref 6.5–8.1)

## 2020-06-26 LAB — CBC WITH DIFFERENTIAL (CANCER CENTER ONLY)
Abs Immature Granulocytes: 0.01 10*3/uL (ref 0.00–0.07)
Basophils Absolute: 0.1 10*3/uL (ref 0.0–0.1)
Basophils Relative: 1 %
Eosinophils Absolute: 0.3 10*3/uL (ref 0.0–0.5)
Eosinophils Relative: 5 %
HCT: 39.7 % (ref 36.0–46.0)
Hemoglobin: 13 g/dL (ref 12.0–15.0)
Immature Granulocytes: 0 %
Lymphocytes Relative: 46 %
Lymphs Abs: 2.7 10*3/uL (ref 0.7–4.0)
MCH: 31.5 pg (ref 26.0–34.0)
MCHC: 32.7 g/dL (ref 30.0–36.0)
MCV: 96.1 fL (ref 80.0–100.0)
Monocytes Absolute: 0.4 10*3/uL (ref 0.1–1.0)
Monocytes Relative: 7 %
Neutro Abs: 2.4 10*3/uL (ref 1.7–7.7)
Neutrophils Relative %: 41 %
Platelet Count: 209 10*3/uL (ref 150–400)
RBC: 4.13 MIL/uL (ref 3.87–5.11)
RDW: 12.4 % (ref 11.5–15.5)
WBC Count: 6 10*3/uL (ref 4.0–10.5)
nRBC: 0 % (ref 0.0–0.2)

## 2020-06-26 LAB — TSH: TSH: 2.019 u[IU]/mL (ref 0.308–3.960)

## 2020-06-26 MED ORDER — SODIUM CHLORIDE 0.9 % IV SOLN
Freq: Once | INTRAVENOUS | Status: AC
  Filled 2020-06-26: qty 250

## 2020-06-26 MED ORDER — SODIUM CHLORIDE 0.9 % IV SOLN
400.0000 mg | Freq: Once | INTRAVENOUS | Status: AC
  Administered 2020-06-26: 400 mg via INTRAVENOUS
  Filled 2020-06-26: qty 16

## 2020-06-26 NOTE — Patient Instructions (Signed)
[facility] Discharge Instructions for Patients Receiving Chemotherapy  Today you received the following chemotherapy agents: keytruda.  To help prevent nausea and vomiting after your treatment, we encourage you to take your nausea medication as directed.   If you develop nausea and vomiting that is not controlled by your nausea medication, call the clinic.   BELOW ARE SYMPTOMS THAT SHOULD BE REPORTED IMMEDIATELY:  *FEVER GREATER THAN 100.5 F  *CHILLS WITH OR WITHOUT FEVER  NAUSEA AND VOMITING THAT IS NOT CONTROLLED WITH YOUR NAUSEA MEDICATION  *UNUSUAL SHORTNESS OF BREATH  *UNUSUAL BRUISING OR BLEEDING  TENDERNESS IN MOUTH AND THROAT WITH OR WITHOUT PRESENCE OF ULCERS  *URINARY PROBLEMS  *BOWEL PROBLEMS  UNUSUAL RASH Items with * indicate a potential emergency and should be followed up as soon as possible.  Feel free to call the clinic should you have any questions or concerns. The clinic phone number is [phone].  Please show the [place] at check-in to the Emergency Department and triage nurse.

## 2020-06-26 NOTE — Progress Notes (Signed)
[facility] OFFICE PROGRESS NOTE   Diagnosis:  Non-small cell lung cancer  INTERVAL HISTORY:   Barbara Watkins returns as scheduled.  She completed a cycle of Pembrolizumab 06/06/2020.  She feels well.  She denies nausea/vomiting.  No mouth sores.  No diarrhea.  No rash.  She denies shortness of breath.  No cough.  No fever.  She denies pain.  Objective:  Vital signs in last 24 hours:  Blood pressure 133/74, pulse 85, temperature 97.8 F (36.6 C), temperature source Temporal, resp. rate 17, height '5\' 4"'$  (1.626 m), weight 115 lb 4.8 oz (52.3 kg), SpO2 96 %.    HEENT: No thrush or ulcers. Resp: Lungs clear bilaterally. Cardio: Regular rate and rhythm. GI: Abdomen soft and nontender.  No hepatomegaly. Vascular: No leg edema. Skin: No rash.   Lab Results:  Lab Results  Component Value Date   WBC 6.0 06/26/2020   HGB 13.0 06/26/2020   HCT 39.7 06/26/2020   MCV 96.1 06/26/2020   PLT 209 06/26/2020   NEUTROABS 2.4 06/26/2020    Imaging:  No results found.  Medications: I have reviewed the patient's current medications.  Assessment/Plan: 1.Metastatic non-small cell lung cancer   CTabdomen/pelvis 06/12/2019-scattered soft tissue masses in left hepatic lobe, right kidney, and the right adrenal gland. Fullness of the left hilum suspicious for left hilar mass and small nodules at the right lung base. Also noted signs of right pyelonephritis.  CT chest 06/13/2019-large encasing soft tissue mass left hilum measuring at least 5.8 x 4.5 x 3.8 cm with narrowing of the left mainstem bronchus and left pulmonary artery. Near-total obstructive atelectasis of the left upper lobe with a portion of the lingula remaining aerated. Multiple bilateral pulmonary nodules.  Biopsy left supraclavicular lymph node 06/15/2019-metastatic lung adenocarcinoma; immunohistochemistry positive for cytokeratin 7, TTF-1, PAX 8 (weak). Napsin-A and CDX-2negative.Foundation  1-microsatellite stable, tumor mutational burden 15, ATM alteration, KRAS G13D;tumor proportion score 100%  Brain CT 06/29/2019-negative for metastatic disease  Cycle 1 carboplatin/Alimta/pembrolizumab 06/29/2019  Cycle 2 carboplatin/Alimta/pembrolizumab 07/20/2019  Cycle 3 carboplatin/Alimta 08/10/2019, pembrolizumab held due to rash  Cycle 4 carboplatin/Alimta, Udenyca added, pembrolizumab held9/01/2019  Cycle 5 Alimta alone (carboplatin discontinued, pembrolizumab held)09/22/2019  CTs 10/06/2019-decrease in size of central left upper lobe pulmonary mass; bilateral pulmonary nodules of varying size some of which are irregular in shape; many of these are stable in the interval. Some of the right lung nodules have decreased/resolved since the prior study. Some new irregular nodules in the left lower lobe indeterminate but potentially infectious/inflammatory. Marked interval decrease in size of left hepatic and right renal metastases. Metastatic retroperitoneal lymphadenopathy in the abdomen has clearly decreased. Metastatic nodules in the right omentum and perirenal fat bilaterally have decreased/resolved. No new or progressive findings.  Cycle 6 Alimta/pembrolizumab 10/17/2019  Cycle 7 Alimta/pembrolizumab 11/08/2019  Cycle 8 Alimta/pembrolizumab   Cycle 9 Pembrolizumab 12/19/2019 (Alimta held due to elevated creatinine)  Cycle 10 Pembrolizumab 01/10/2020 (Alimta held due to borderline creatinine)  CTs 01/26/2020-decreased left hilar soft tissue, persistent mass effect on the left upper lobe pulmonary artery, medial left upper lobe pulmonary lesion has decreased in size with further reexpansion of the left upper lobe, 5 mm right upper lobe nodule more conspicuous, new spiculated nodular densities in the left lower lobe on previous exam have almost completely resolved, decreased left hepatic lesion, decreased right renal lesion, resolved right adrenal lesion decreased retroperitoneal  lymphadenopathy  Cycle 11 Alimta/pembrolizumab 01/31/2020  Cycle 12 Alimta/pembrolizumab 02/21/2020  Cycle 13 Alimta/pembrolizumab 03/13/2020, Aloxi  added for delayed nausea  Cycle 14 Alimta/pembrolizumab 04/02/2020, Aloxi plus Emend  Cycle 15 pembrolizumab alone 04/27/2020 (Alimta held due to elevated creatinine)  Cycle 16 pembrolizumab alone 05/18/2020  CTs 06/05/2020-stable exam.  No significant change in size of medial left upper lobe lung mass.  Unchanged appearance of left hepatic lobe metastasis.  Slight decrease in size of right kidney lesion.  Small pulmonary nodules stable to minimally decreased.  No new or progressive disease.  Cycle 17 pembrolizumab 06/06/2020 2. Anemia 3.Urinary tract infection 4. 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 Nausea 6. Constipation 7.Right abdominal pain secondary to #1 8.Skin rashbilateral forearms, grade 2, likely related to pembrolizumab7/22/2020; grade 3 08/03/2019, prednisone initiated; rash improved 08/10/2019, prednisone decreased to 10 mg daily x7 days then discontinued   Disposition: [name] appears stable.  She is currently on active treatment with pembrolizumab.  There is no clinical evidence of disease progression.  Plan to proceed with cycle 18 today as scheduled.  She would like to change to the every 6 week pembrolizumab dose beginning with today's treatment.  We reviewed the CBC and chemistry panel from today.  Labs adequate to proceed.  She will return for lab, follow-up, pembrolizumab in 6 weeks.  She will contact the office in the interim with any problems.  Plan reviewed with Dr. Benay Spice.  Ned Card ANP/GNP-BC   06/26/2020  12:13 PM

## 2020-06-26 NOTE — Progress Notes (Signed)
Spoke with Lattie Haw - patient to change treatment schedule/dose to pembrolizumab 400mg  IV every 6 weeks starting with today's treatment. No further alimta. PA team notified, no changes in auth required with this change.   Demetrius Charity, PharmD, BCPS, [facility] Pharmacist Pharmacy Phone: [phone] 06/26/2020

## 2020-06-27 ENCOUNTER — Ambulatory Visit: Payer: Medicaid Other | Admitting: Oncology

## 2020-06-27 ENCOUNTER — Other Ambulatory Visit: Payer: Medicaid Other

## 2020-06-27 ENCOUNTER — Ambulatory Visit: Payer: Medicaid Other

## 2020-07-04 ENCOUNTER — Telehealth: Payer: Self-pay | Admitting: Nurse Practitioner

## 2020-07-04 NOTE — Telephone Encounter (Signed)
Rescheduled 8/10 appts per 7/7 staff msg. Called and spoke with pt, confirmed 8/16 appts

## 2020-07-30 MED FILL — FOLIC ACID 1 MG TABS: 1 | 30 days supply | Qty: 30 | Fill #6

## 2020-08-07 ENCOUNTER — Ambulatory Visit: Payer: Medicaid Other

## 2020-08-07 ENCOUNTER — Other Ambulatory Visit: Payer: Medicaid Other

## 2020-08-07 ENCOUNTER — Ambulatory Visit: Payer: Medicaid Other | Admitting: Oncology

## 2020-08-11 ENCOUNTER — Other Ambulatory Visit: Payer: Self-pay | Admitting: Oncology

## 2020-08-13 ENCOUNTER — Inpatient Hospital Stay (HOSPITAL_BASED_OUTPATIENT_CLINIC_OR_DEPARTMENT_OTHER): Payer: Medicaid Other | Admitting: Nurse Practitioner

## 2020-08-13 ENCOUNTER — Encounter: Payer: Self-pay | Admitting: Nurse Practitioner

## 2020-08-13 ENCOUNTER — Inpatient Hospital Stay: Payer: Medicaid Other

## 2020-08-13 ENCOUNTER — Inpatient Hospital Stay: Payer: Medicaid Other | Attending: Nurse Practitioner

## 2020-08-13 ENCOUNTER — Other Ambulatory Visit: Payer: Self-pay

## 2020-08-13 VITALS — BP 145/80 | HR 86 | Temp 97.1°F | Resp 17 | Ht 64.0 in | Wt 118.6 lb

## 2020-08-13 DIAGNOSIS — C787 Secondary malignant neoplasm of liver and intrahepatic bile duct: Secondary | ICD-10-CM | POA: Diagnosis not present

## 2020-08-13 DIAGNOSIS — R590 Localized enlarged lymph nodes: Secondary | ICD-10-CM | POA: Diagnosis not present

## 2020-08-13 DIAGNOSIS — C3492 Malignant neoplasm of unspecified part of left bronchus or lung: Secondary | ICD-10-CM | POA: Diagnosis not present

## 2020-08-13 DIAGNOSIS — D649 Anemia, unspecified: Secondary | ICD-10-CM | POA: Diagnosis not present

## 2020-08-13 DIAGNOSIS — C7902 Secondary malignant neoplasm of left kidney and renal pelvis: Secondary | ICD-10-CM | POA: Insufficient documentation

## 2020-08-13 DIAGNOSIS — Z79899 Other long term (current) drug therapy: Secondary | ICD-10-CM | POA: Insufficient documentation

## 2020-08-13 DIAGNOSIS — Z5112 Encounter for antineoplastic immunotherapy: Secondary | ICD-10-CM | POA: Diagnosis not present

## 2020-08-13 DIAGNOSIS — C3412 Malignant neoplasm of upper lobe, left bronchus or lung: Secondary | ICD-10-CM | POA: Diagnosis present

## 2020-08-13 LAB — CBC WITH DIFFERENTIAL (CANCER CENTER ONLY)
Abs Immature Granulocytes: 0.02 10*3/uL (ref 0.00–0.07)
Basophils Absolute: 0.1 10*3/uL (ref 0.0–0.1)
Basophils Relative: 1 %
Eosinophils Absolute: 0.3 10*3/uL (ref 0.0–0.5)
Eosinophils Relative: 5 %
HCT: 39.9 % (ref 36.0–46.0)
Hemoglobin: 13 g/dL (ref 12.0–15.0)
Immature Granulocytes: 0 %
Lymphocytes Relative: 41 %
Lymphs Abs: 2.6 10*3/uL (ref 0.7–4.0)
MCH: 30.2 pg (ref 26.0–34.0)
MCHC: 32.6 g/dL (ref 30.0–36.0)
MCV: 92.8 fL (ref 80.0–100.0)
Monocytes Absolute: 0.4 10*3/uL (ref 0.1–1.0)
Monocytes Relative: 7 %
Neutro Abs: 2.9 10*3/uL (ref 1.7–7.7)
Neutrophils Relative %: 46 %
Platelet Count: 185 10*3/uL (ref 150–400)
RBC: 4.3 MIL/uL (ref 3.87–5.11)
RDW: 12.7 % (ref 11.5–15.5)
WBC Count: 6.3 10*3/uL (ref 4.0–10.5)
nRBC: 0 % (ref 0.0–0.2)

## 2020-08-13 LAB — CMP (CANCER CENTER ONLY)
ALT: 7 U/L (ref 0–44)
AST: 13 U/L — ABNORMAL LOW (ref 15–41)
Albumin: 3.9 g/dL (ref 3.5–5.0)
Alkaline Phosphatase: 91 U/L (ref 38–126)
Anion gap: 8 (ref 5–15)
BUN: 21 mg/dL — ABNORMAL HIGH (ref 6–20)
CO2: 23 mmol/L (ref 22–32)
Calcium: 11 mg/dL — ABNORMAL HIGH (ref 8.9–10.3)
Chloride: 110 mmol/L (ref 98–111)
Creatinine: 1.23 mg/dL — ABNORMAL HIGH (ref 0.44–1.00)
GFR, Est AFR Am: 55 mL/min — ABNORMAL LOW (ref >60)
GFR, Estimated: 48 mL/min — ABNORMAL LOW (ref >60)
Glucose, Bld: 92 mg/dL (ref 70–99)
Potassium: 4 mmol/L (ref 3.5–5.1)
Sodium: 141 mmol/L (ref 135–145)
Total Bilirubin: 0.4 mg/dL (ref 0.3–1.2)
Total Protein: 7.3 g/dL (ref 6.5–8.1)

## 2020-08-13 LAB — TSH: TSH: 2.749 u[IU]/mL (ref 0.308–3.960)

## 2020-08-13 MED ORDER — SODIUM CHLORIDE 0.9 % IV SOLN
400.0000 mg | Freq: Once | INTRAVENOUS | Status: AC
  Administered 2020-08-13: 400 mg via INTRAVENOUS
  Filled 2020-08-13: qty 16

## 2020-08-13 MED ORDER — SODIUM CHLORIDE 0.9 % IV SOLN
Freq: Once | INTRAVENOUS | Status: AC
  Filled 2020-08-13: qty 250

## 2020-08-13 MED ORDER — TRAMADOL HCL 50 MG PO TABS: 25.0000 mg | ORAL_TABLET | Freq: Two times a day (BID) | ORAL | 1 refills | Status: DC | PRN

## 2020-08-13 MED FILL — traMADol HCL 50 MG TABS: 50 | 30 days supply | Qty: 30 | Fill #0

## 2020-08-13 NOTE — Progress Notes (Addendum)
[facility] OFFICE PROGRESS NOTE   Diagnosis: Non-small cell lung cancer  INTERVAL HISTORY:   Barbara Watkins returns for follow-up.  She completed a cycle of Pembrolizumab 06/26/2020.  She denies nausea/vomiting.  No diarrhea.  No rash.  No tearing.  She denies fever, cough, shortness of breath.  About 2 weeks ago she noted a lesion at the right upper lip, at times.  At times pruritic and tender.  She does not recall the lesion having a blister appearance.  There is been no drainage.  Stable chronic back pain.  She takes tramadol as needed.  Objective:  Vital signs in last 24 hours:  Blood pressure (!) 145/80, pulse 86, temperature (!) 97.1 F (36.2 C), temperature source Temporal, resp. rate 17, height _0  (1.626 m), weight 118 lb 9.6 oz (53.8 kg), SpO2 100 %.    HEENT: 5 to 6 mm raised nodular tender lesion right upper lip border. Lymphatics: No palpable cervical or supraclavicular lymph nodes. Resp: Lungs clear bilaterally. Cardio: Regular rate and rhythm. GI: Abdomen soft and nontender.  No hepatomegaly. Vascular: No leg edema. Neuro: Alert and oriented. Skin: No rash.   Lab Results:  Lab Results  Component Value Date   WBC 6.3 08/13/2020   HGB 13.0 08/13/2020   HCT 39.9 08/13/2020   MCV 92.8 08/13/2020   PLT 185 08/13/2020   NEUTROABS 2.9 08/13/2020    Imaging:  No results found.  Medications: I have reviewed the patient's current medications.  Assessment/Plan: 1.Metastatic non-small cell lung cancer   CTabdomen/pelvis 06/12/2019-scattered soft tissue masses in left hepatic lobe, right kidney, and the right adrenal gland. Fullness of the left hilum suspicious for left hilar mass and small nodules at the right lung base. Also noted signs of right pyelonephritis.  CT chest 06/13/2019-large encasing soft tissue mass left hilum measuring at least 5.8 x 4.5 x 3.8 cm with narrowing of the left mainstem bronchus and left pulmonary artery.  Near-total obstructive atelectasis of the left upper lobe with a portion of the lingula remaining aerated. Multiple bilateral pulmonary nodules.  Biopsy left supraclavicular lymph node 06/15/2019-metastatic lung adenocarcinoma; immunohistochemistry positive for cytokeratin 7, TTF-1, PAX 8 (weak). Napsin-A and CDX-2negative.Foundation 1-microsatellite stable, tumor mutational burden 15, ATM alteration, KRAS G13D;tumor proportion score 100%  Brain CT 06/29/2019-negative for metastatic disease  Cycle 1 carboplatin/Alimta/pembrolizumab 06/29/2019  Cycle 2 carboplatin/Alimta/pembrolizumab 07/20/2019  Cycle 3 carboplatin/Alimta 08/10/2019, pembrolizumab held due to rash  Cycle 4 carboplatin/Alimta, Udenyca added, pembrolizumab held9/01/2019  Cycle 5 Alimta alone (carboplatin discontinued, pembrolizumab held)09/22/2019  CTs 10/06/2019-decrease in size of central left upper lobe pulmonary mass; bilateral pulmonary nodules of varying size some of which are irregular in shape; many of these are stable in the interval. Some of the right lung nodules have decreased/resolved since the prior study. Some new irregular nodules in the left lower lobe indeterminate but potentially infectious/inflammatory. Marked interval decrease in size of left hepatic and right renal metastases. Metastatic retroperitoneal lymphadenopathy in the abdomen has clearly decreased. Metastatic nodules in the right omentum and perirenal fat bilaterally have decreased/resolved. No new or progressive findings.  Cycle 6 Alimta/pembrolizumab 10/17/2019  Cycle 7 Alimta/pembrolizumab 11/08/2019  Cycle 8 Alimta/pembrolizumab   Cycle 9 Pembrolizumab 12/19/2019 (Alimta held due to elevated creatinine)  Cycle 10 Pembrolizumab 01/10/2020 (Alimta held due to borderline creatinine)  CTs 01/26/2020-decreased left hilar soft tissue, persistent mass effect on the left upper lobe pulmonary artery, medial left upper lobe pulmonary lesion  has decreased in size with further reexpansion of the  left upper lobe, 5 mm right upper lobe nodule more conspicuous, new spiculated nodular densities in the left lower lobe on previous exam have almost completely resolved, decreased left hepatic lesion, decreased right renal lesion, resolved right adrenal lesion decreased retroperitoneal lymphadenopathy  Cycle 11 Alimta/pembrolizumab 01/31/2020  Cycle 12 Alimta/pembrolizumab 02/21/2020  Cycle 13 Alimta/pembrolizumab 03/13/2020, Aloxi added for delayed nausea  Cycle 14 Alimta/pembrolizumab 04/02/2020, Aloxi plus Emend  Cycle 15 pembrolizumab alone 04/27/2020 (Alimta held due to elevated creatinine)  Cycle 16 pembrolizumab alone 05/18/2020  CTs 06/05/2020-stable exam. No significant change in size of medial left upper lobe lung mass. Unchanged appearance of left hepatic lobe metastasis. Slight decrease in size of right kidney lesion. Small pulmonary nodules stable to minimally decreased. No new or progressive disease.  Cycle 17 pembrolizumab 06/06/2020 (6-week dosing)  Cycle 18 Pembrolizumab 08/13/2020 (6-week dosing) 2. Anemia 3.Urinary tract infection 4. 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 Nausea 6. Constipation 7.Right abdominal pain secondary to #1 8.Skin rashbilateral forearms, grade 2, likely related to pembrolizumab7/22/2020; grade 3 08/03/2019, prednisone initiated; rash improved 08/10/2019, prednisone decreased to 10 mg daily x7 days then discontinued 9.  Nodular right lip lesion 08/13/2020, referred to dermatology    Disposition: Barbara Watkins appears stable.  Plan to proceed with pembrolizumab today as scheduled.  We reviewed the CBC and chemistry panel from today.  Labs adequate to proceed as above.  We made a referral to dermatology for evaluation of the right upper lip lesion.  She will return for lab, follow-up, pembrolizumab in 6 weeks.  Patient seen with Dr. Benay Spice.    Ned Card ANP/GNP-BC    08/13/2020  2:26 PM This was a shared visit with Ned Card.  Barbara Watkins was interviewed and examined.  She has developed a raised firm lesion at the mid upper lip.  The etiology of this lesion is unclear.  I doubt this is related to the pembrolizumab or diagnosis of metastatic lung cancer.  We made a dermatology referral.  [name], MD

## 2020-08-13 NOTE — Patient Instructions (Signed)
[facility] Discharge Instructions for Patients Receiving Chemotherapy  Today you received the following chemotherapy agents: keytruda.  To help prevent nausea and vomiting after your treatment, we encourage you to take your nausea medication as directed.   If you develop nausea and vomiting that is not controlled by your nausea medication, call the clinic.   BELOW ARE SYMPTOMS THAT SHOULD BE REPORTED IMMEDIATELY:  *FEVER GREATER THAN 100.5 F  *CHILLS WITH OR WITHOUT FEVER  NAUSEA AND VOMITING THAT IS NOT CONTROLLED WITH YOUR NAUSEA MEDICATION  *UNUSUAL SHORTNESS OF BREATH  *UNUSUAL BRUISING OR BLEEDING  TENDERNESS IN MOUTH AND THROAT WITH OR WITHOUT PRESENCE OF ULCERS  *URINARY PROBLEMS  *BOWEL PROBLEMS  UNUSUAL RASH Items with * indicate a potential emergency and should be followed up as soon as possible.  Feel free to call the clinic should you have any questions or concerns. The clinic phone number is [phone].  Please show the [facility] at check-in to the Emergency Department and triage nurse.

## 2020-08-14 ENCOUNTER — Telehealth: Payer: Self-pay | Admitting: Nurse Practitioner

## 2020-08-14 ENCOUNTER — Telehealth: Payer: Self-pay | Admitting: Oncology

## 2020-08-14 NOTE — Telephone Encounter (Signed)
Scheduled appointments per 8/16 los. Patient is aware of appointment's dates and times. Referral has been sent.

## 2020-08-14 NOTE — Telephone Encounter (Signed)
Per 8/16 los. Referral has been sent to [facility]

## 2020-08-20 ENCOUNTER — Telehealth: Payer: Self-pay | Admitting: *Deleted

## 2020-08-20 DIAGNOSIS — C3492 Malignant neoplasm of unspecified part of left bronchus or lung: Secondary | ICD-10-CM

## 2020-08-20 NOTE — Telephone Encounter (Signed)
[facility] declined referral. They do not accept Medicaid patients. Per Dr. Benay Spice: Refer to Dr. Lavonna Monarch (is with [facility] system). Referral faxed to Dr. Onalee Hua office at [phone]. Left VM for patient the reason for change in dermatology practice.

## 2020-08-30 ENCOUNTER — Telehealth: Payer: Self-pay | Admitting: *Deleted

## 2020-08-30 ENCOUNTER — Other Ambulatory Visit: Payer: Self-pay | Admitting: Nurse Practitioner

## 2020-08-30 DIAGNOSIS — C799 Secondary malignant neoplasm of unspecified site: Secondary | ICD-10-CM

## 2020-08-30 DIAGNOSIS — C3492 Malignant neoplasm of unspecified part of left bronchus or lung: Secondary | ICD-10-CM

## 2020-08-30 NOTE — Telephone Encounter (Signed)
Informed patient that per pharmacy, she no longer needs to take the folic acid, since her last Alimta was 03/2020.

## 2020-09-23 ENCOUNTER — Other Ambulatory Visit: Payer: Self-pay | Admitting: Oncology

## 2020-09-24 ENCOUNTER — Inpatient Hospital Stay: Payer: Medicaid Other

## 2020-09-24 ENCOUNTER — Telehealth: Payer: Self-pay | Admitting: Oncology

## 2020-09-24 ENCOUNTER — Inpatient Hospital Stay: Payer: Medicaid Other | Attending: Nurse Practitioner

## 2020-09-24 ENCOUNTER — Inpatient Hospital Stay (HOSPITAL_BASED_OUTPATIENT_CLINIC_OR_DEPARTMENT_OTHER): Payer: Medicaid Other | Admitting: Oncology

## 2020-09-24 ENCOUNTER — Other Ambulatory Visit: Payer: Self-pay

## 2020-09-24 VITALS — BP 135/83 | HR 88 | Temp 97.8°F | Resp 18 | Ht 64.0 in | Wt 120.7 lb

## 2020-09-24 DIAGNOSIS — D649 Anemia, unspecified: Secondary | ICD-10-CM | POA: Diagnosis not present

## 2020-09-24 DIAGNOSIS — C7902 Secondary malignant neoplasm of left kidney and renal pelvis: Secondary | ICD-10-CM | POA: Insufficient documentation

## 2020-09-24 DIAGNOSIS — Z5112 Encounter for antineoplastic immunotherapy: Secondary | ICD-10-CM | POA: Diagnosis not present

## 2020-09-24 DIAGNOSIS — C787 Secondary malignant neoplasm of liver and intrahepatic bile duct: Secondary | ICD-10-CM | POA: Diagnosis not present

## 2020-09-24 DIAGNOSIS — C3492 Malignant neoplasm of unspecified part of left bronchus or lung: Secondary | ICD-10-CM

## 2020-09-24 DIAGNOSIS — R590 Localized enlarged lymph nodes: Secondary | ICD-10-CM | POA: Insufficient documentation

## 2020-09-24 DIAGNOSIS — Z23 Encounter for immunization: Secondary | ICD-10-CM

## 2020-09-24 DIAGNOSIS — Z79899 Other long term (current) drug therapy: Secondary | ICD-10-CM | POA: Diagnosis not present

## 2020-09-24 DIAGNOSIS — C3412 Malignant neoplasm of upper lobe, left bronchus or lung: Secondary | ICD-10-CM | POA: Diagnosis present

## 2020-09-24 LAB — LIPID PANEL
Cholesterol: 227 mg/dL — ABNORMAL HIGH (ref 0–200)
HDL: 63 mg/dL (ref >40)
LDL Cholesterol: 129 mg/dL — ABNORMAL HIGH (ref 0–99)
Total CHOL/HDL Ratio: 3.6 RATIO
Triglycerides: 174 mg/dL — ABNORMAL HIGH (ref <150)
VLDL: 35 mg/dL (ref 0–40)

## 2020-09-24 LAB — CBC WITH DIFFERENTIAL (CANCER CENTER ONLY)
Abs Immature Granulocytes: 0.02 10*3/uL (ref 0.00–0.07)
Basophils Absolute: 0.1 10*3/uL (ref 0.0–0.1)
Basophils Relative: 1 %
Eosinophils Absolute: 0.3 10*3/uL (ref 0.0–0.5)
Eosinophils Relative: 5 %
HCT: 41 % (ref 36.0–46.0)
Hemoglobin: 13.4 g/dL (ref 12.0–15.0)
Immature Granulocytes: 0 %
Lymphocytes Relative: 41 %
Lymphs Abs: 2.6 10*3/uL (ref 0.7–4.0)
MCH: 29.8 pg (ref 26.0–34.0)
MCHC: 32.7 g/dL (ref 30.0–36.0)
MCV: 91.3 fL (ref 80.0–100.0)
Monocytes Absolute: 0.4 10*3/uL (ref 0.1–1.0)
Monocytes Relative: 6 %
Neutro Abs: 3.1 10*3/uL (ref 1.7–7.7)
Neutrophils Relative %: 47 %
Platelet Count: 226 10*3/uL (ref 150–400)
RBC: 4.49 MIL/uL (ref 3.87–5.11)
RDW: 13.5 % (ref 11.5–15.5)
WBC Count: 6.5 10*3/uL (ref 4.0–10.5)
nRBC: 0 % (ref 0.0–0.2)

## 2020-09-24 LAB — CMP (CANCER CENTER ONLY)
ALT: 9 U/L (ref 0–44)
AST: 15 U/L (ref 15–41)
Albumin: 4 g/dL (ref 3.5–5.0)
Alkaline Phosphatase: 99 U/L (ref 38–126)
Anion gap: 6 (ref 5–15)
BUN: 16 mg/dL (ref 6–20)
CO2: 26 mmol/L (ref 22–32)
Calcium: 10.6 mg/dL — ABNORMAL HIGH (ref 8.9–10.3)
Chloride: 109 mmol/L (ref 98–111)
Creatinine: 1.29 mg/dL — ABNORMAL HIGH (ref 0.44–1.00)
GFR, Est AFR Am: 52 mL/min — ABNORMAL LOW (ref >60)
GFR, Estimated: 45 mL/min — ABNORMAL LOW (ref >60)
Glucose, Bld: 78 mg/dL (ref 70–99)
Potassium: 4 mmol/L (ref 3.5–5.1)
Sodium: 141 mmol/L (ref 135–145)
Total Bilirubin: 0.4 mg/dL (ref 0.3–1.2)
Total Protein: 7.6 g/dL (ref 6.5–8.1)

## 2020-09-24 LAB — TSH: TSH: 2.293 u[IU]/mL (ref 0.308–3.960)

## 2020-09-24 MED ORDER — SODIUM CHLORIDE 0.9 % IV SOLN
400.0000 mg | Freq: Once | INTRAVENOUS | Status: AC
  Administered 2020-09-24: 400 mg via INTRAVENOUS
  Filled 2020-09-24: qty 16

## 2020-09-24 MED ORDER — SODIUM CHLORIDE 0.9 % IV SOLN
Freq: Once | INTRAVENOUS | Status: AC
  Filled 2020-09-24: qty 250

## 2020-09-24 NOTE — Telephone Encounter (Signed)
Scheduled appointments per 9/27 los. Gave patient calendar print out.

## 2020-09-24 NOTE — Progress Notes (Signed)
[facility] OFFICE PROGRESS NOTE   Diagnosis: Non-small cell lung cancer  INTERVAL HISTORY:   Barbara Watkins returns as scheduled.  She complete another treatment with pembrolizumab on 08/13/2020.  The lip lesion resolved before she saw dermatology.  She used a "wart "medication on the lesion.  She feels well.  No rash or diarrhea.  No complaint.  Objective:  Vital signs in last 24 hours:  Blood pressure 135/83, pulse 88, temperature 97.8 F (36.6 C), temperature source Tympanic, resp. rate 18, height 5' 4"  (1.626 m), weight 120 lb 11.2 oz (54.7 kg), SpO2 99 %.    Resp: Lungs clear bilaterally Cardio: Regular rate and rhythm GI: No hepatosplenomegaly Vascular: No leg edema   Lab Results:  Lab Results  Component Value Date   WBC 6.5 09/24/2020   HGB 13.4 09/24/2020   HCT 41.0 09/24/2020   MCV 91.3 09/24/2020   PLT 226 09/24/2020   NEUTROABS 3.1 09/24/2020    CMP  Lab Results  Component Value Date   NA 141 08/13/2020   K 4.0 08/13/2020   CL 110 08/13/2020   CO2 23 08/13/2020   GLUCOSE 92 08/13/2020   BUN 21 (H) 08/13/2020   CREATININE 1.23 (H) 08/13/2020   CALCIUM 11.0 (H) 08/13/2020   PROT 7.3 08/13/2020   ALBUMIN 3.9 08/13/2020   AST 13 (L) 08/13/2020   ALT 7 08/13/2020   ALKPHOS 91 08/13/2020   BILITOT 0.4 08/13/2020   GFRNONAA 48 (L) 08/13/2020   GFRAA 55 (L) 08/13/2020    Medications: I have reviewed the patient's current medications.   Assessment/Plan:  1.Metastatic non-small cell lung cancer   CTabdomen/pelvis 06/12/2019-scattered soft tissue masses in left hepatic lobe, right kidney, and the right adrenal gland. Fullness of the left hilum suspicious for left hilar mass and small nodules at the right lung base. Also noted signs of right pyelonephritis.  CT chest 06/13/2019-large encasing soft tissue mass left hilum measuring at least 5.8 x 4.5 x 3.8 cm with narrowing of the left mainstem bronchus and left pulmonary artery.  Near-total obstructive atelectasis of the left upper lobe with a portion of the lingula remaining aerated. Multiple bilateral pulmonary nodules.  Biopsy left supraclavicular lymph node 06/15/2019-metastatic lung adenocarcinoma; immunohistochemistry positive for cytokeratin 7, TTF-1, PAX 8 (weak). Napsin-A and CDX-2negative.Foundation 1-microsatellite stable, tumor mutational burden 15, ATM alteration, KRAS G13D;tumor proportion score 100%  Brain CT 06/29/2019-negative for metastatic disease  Cycle 1 carboplatin/Alimta/pembrolizumab 06/29/2019  Cycle 2 carboplatin/Alimta/pembrolizumab 07/20/2019  Cycle 3 carboplatin/Alimta 08/10/2019, pembrolizumab held due to rash  Cycle 4 carboplatin/Alimta, Udenyca added, pembrolizumab held9/01/2019  Cycle 5 Alimta alone (carboplatin discontinued, pembrolizumab held)09/22/2019  CTs 10/06/2019-decrease in size of central left upper lobe pulmonary mass; bilateral pulmonary nodules of varying size some of which are irregular in shape; many of these are stable in the interval. Some of the right lung nodules have decreased/resolved since the prior study. Some new irregular nodules in the left lower lobe indeterminate but potentially infectious/inflammatory. Marked interval decrease in size of left hepatic and right renal metastases. Metastatic retroperitoneal lymphadenopathy in the abdomen has clearly decreased. Metastatic nodules in the right omentum and perirenal fat bilaterally have decreased/resolved. No new or progressive findings.  Cycle 6 Alimta/pembrolizumab 10/17/2019  Cycle 7 Alimta/pembrolizumab 11/08/2019  Cycle 8 Alimta/pembrolizumab   Cycle 9 Pembrolizumab 12/19/2019 (Alimta held due to elevated creatinine)  Cycle 10 Pembrolizumab 01/10/2020 (Alimta held due to borderline creatinine)  CTs 01/26/2020-decreased left hilar soft tissue, persistent mass effect on the left upper lobe  pulmonary artery, medial left upper lobe pulmonary lesion  has decreased in size with further reexpansion of the left upper lobe, 5 mm right upper lobe nodule more conspicuous, new spiculated nodular densities in the left lower lobe on previous exam have almost completely resolved, decreased left hepatic lesion, decreased right renal lesion, resolved right adrenal lesion decreased retroperitoneal lymphadenopathy  Cycle 11 Alimta/pembrolizumab 01/31/2020  Cycle 12 Alimta/pembrolizumab 02/21/2020  Cycle 13 Alimta/pembrolizumab 03/13/2020, Aloxi added for delayed nausea  Cycle 14 Alimta/pembrolizumab 04/02/2020, Aloxi plus Emend  Cycle 15 pembrolizumab alone 04/27/2020 (Alimta held due to elevated creatinine)  Cycle 16 pembrolizumab alone 05/18/2020  CTs 06/05/2020-stable exam. No significant change in size of medial left upper lobe lung mass. Unchanged appearance of left hepatic lobe metastasis. Slight decrease in size of right kidney lesion. Small pulmonary nodules stable to minimally decreased. No new or progressive disease.  Cycle 17 pembrolizumab 06/06/2020 (6-week dosing)  Cycle 18 Pembrolizumab 08/13/2020 (6-week dosing)  Cycle 19 pembrolizumab 09/24/2020 (6-week dosing) 2. Anemia 3.Urinary tract infection 4. 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 Nausea 6. Constipation 7.Right abdominal pain secondary to #1 8.Skin rashbilateral forearms, grade 2, likely related to pembrolizumab7/22/2020; grade 3 08/03/2019, prednisone initiated; rash improved 08/10/2019, prednisone decreased to 10 mg daily x7 days then discontinued 9.  Nodular right lip lesion 08/13/2020, referred to dermatology     Disposition: Barbara Watkins appears unchanged.  She is tolerating the pembrolizumab well.  There is no clinical evidence of disease progression.  She will like to wait on restaging scans until after the next treatment.  She will complete another treatment with pembrolizumab today.  She will receive the first Covid vaccine today.  Barbara Watkins will  return for an office visit and pembrolizumab in 6 weeks.  She will receive the influenza vaccine when she returns in 6 weeks.  Betsy Coder, MD  09/24/2020  12:15 PM

## 2020-09-24 NOTE — Patient Instructions (Signed)
[facility] Discharge Instructions for Patients Receiving Chemotherapy  Today you received the following chemotherapy agents: keytruda.  To help prevent nausea and vomiting after your treatment, we encourage you to take your nausea medication as directed.   If you develop nausea and vomiting that is not controlled by your nausea medication, call the clinic.   BELOW ARE SYMPTOMS THAT SHOULD BE REPORTED IMMEDIATELY:  *FEVER GREATER THAN 100.5 F  *CHILLS WITH OR WITHOUT FEVER  NAUSEA AND VOMITING THAT IS NOT CONTROLLED WITH YOUR NAUSEA MEDICATION  *UNUSUAL SHORTNESS OF BREATH  *UNUSUAL BRUISING OR BLEEDING  TENDERNESS IN MOUTH AND THROAT WITH OR WITHOUT PRESENCE OF ULCERS  *URINARY PROBLEMS  *BOWEL PROBLEMS  UNUSUAL RASH Items with * indicate a potential emergency and should be followed up as soon as possible.  Feel free to call the clinic should you have any questions or concerns. The clinic phone number is [phone].  Please show the [facility] at check-in to the Emergency Department and triage nurse.

## 2020-09-24 NOTE — Progress Notes (Signed)
   Covid-19 Vaccination Clinic  Name:  ANICE WILSHIRE    MRN: [id number] DOB: [birth year]  09/24/2020  Ms. Trampe was observed post Covid-19 immunization for 15 minutes without incident. She was provided with Vaccine Information Sheet and instruction to access the V-Safe system.   Ms. Brenner was instructed to call 911 with any severe reactions post vaccine: Marland Kitchen Difficulty breathing  . Swelling of face and throat  . A fast heartbeat  . A bad rash all over body  . Dizziness and weakness   Immunizations Administered    Name Date Dose VIS Date Route   Pfizer COVID-19 Vaccine 09/24/2020  2:05 PM 0.3 mL 02/22/2019 Intramuscular   Manufacturer: [place]   Lot: [id number]   Juana Diaz: [id number]

## 2020-09-28 ENCOUNTER — Telehealth: Payer: Self-pay | Admitting: *Deleted

## 2020-09-28 NOTE — Telephone Encounter (Signed)
Notified of slight elevation of cholesterol. MD wants her to continue the atorvostatin. She said she has been out for several months. Does not have a PCP. Per Dr. Benay Spice: Hold off on atorvastatin for now. She agrees to see PCP and agrees to allow [place] to contact [facility] to establish for primary care.

## 2020-09-28 NOTE — Telephone Encounter (Signed)
-----   Message from Ladell Pier, MD sent at 09/28/2020  1:19 PM EDT ----- Please call patient, cholesterol mildly elevated, continue lipitor

## 2020-10-10 ENCOUNTER — Ambulatory Visit (HOSPITAL_COMMUNITY)
Admission: RE | Admit: 2020-10-10 | Discharge: 2020-10-10 | Disposition: A | Discharge status: Home / Self Care | Payer: Medicaid Other | Source: Ambulatory Visit | Attending: Nurse Practitioner | Admitting: Nurse Practitioner

## 2020-10-10 ENCOUNTER — Other Ambulatory Visit: Payer: Self-pay | Admitting: Nurse Practitioner

## 2020-10-10 ENCOUNTER — Inpatient Hospital Stay: Payer: Medicaid Other | Attending: Nurse Practitioner | Admitting: Nurse Practitioner

## 2020-10-10 ENCOUNTER — Other Ambulatory Visit: Payer: Self-pay

## 2020-10-10 VITALS — BP 151/81 | HR 100 | Temp 97.8°F | Resp 18 | Ht 64.0 in | Wt 120.6 lb

## 2020-10-10 DIAGNOSIS — Z87891 Personal history of nicotine dependence: Secondary | ICD-10-CM | POA: Diagnosis not present

## 2020-10-10 DIAGNOSIS — R0602 Shortness of breath: Secondary | ICD-10-CM

## 2020-10-10 DIAGNOSIS — C3492 Malignant neoplasm of unspecified part of left bronchus or lung: Secondary | ICD-10-CM

## 2020-10-10 DIAGNOSIS — C787 Secondary malignant neoplasm of liver and intrahepatic bile duct: Secondary | ICD-10-CM | POA: Insufficient documentation

## 2020-10-10 DIAGNOSIS — M546 Pain in thoracic spine: Secondary | ICD-10-CM

## 2020-10-10 DIAGNOSIS — C799 Secondary malignant neoplasm of unspecified site: Secondary | ICD-10-CM | POA: Insufficient documentation

## 2020-10-10 DIAGNOSIS — C3412 Malignant neoplasm of upper lobe, left bronchus or lung: Secondary | ICD-10-CM | POA: Insufficient documentation

## 2020-10-10 DIAGNOSIS — Z23 Encounter for immunization: Secondary | ICD-10-CM | POA: Diagnosis not present

## 2020-10-10 NOTE — Progress Notes (Signed)
Pt seen by NP Burns Spain only, no assessment by [facility] RN at this time d/t time constraints.  NP Sarah aware.

## 2020-10-10 NOTE — Patient Instructions (Signed)

## 2020-10-10 NOTE — Progress Notes (Addendum)
[facility] Progress Note   Barbara Watkins [id number] 1961-03-02 59 y.o.  Barbara Watkins is managed by Dr. [name]   Actively treated with chemotherapy/immunotherapy/hormonal therapy: yes  Current therapy: Keytruda q6 weeks   Last treated: 09/24/2020  Next scheduled appointment with provider: 11/05/2020   Assessment: Plan:    Acute bilateral thoracic back pain - Plan: CANCELED: DG Thoracic Spine 4V  Shortness of breath - Plan: CANCELED: DG Thoracic Spine 4V  Malignant neoplasm of left lung, unspecified part of lung ([place]) - Plan: CANCELED: DG Thoracic Spine 4V  Metastatic malignant neoplasm, unspecified site ([place]) - Plan: CANCELED: DG Thoracic Spine 4V   Plan: Pt had 2 view Xray of Thoracic Spine today, results pending. She will take Tramadol for the back pain and let us know if that doesn't relieve the pain or if the shortness of breath gets worse. Dr. Benay Spice saw this patient with me today and we discussed possible causes for the pain - a musculoskeletal sprain, a new compression fracture or disc issue. Patient is in agreement with plan to get Xray today, and we will follow up with her with the results.   Please see After Visit Summary for patient specific instructions.  Future Appointments  Date Time Provider [place]  10/15/2020  2:00 PM [place] [facility] None  11/05/2020  1:15 PM [facility] LAB [facility] None  11/05/2020  1:45 PM Owens Shark, NP [facility] None  11/05/2020  2:30 PM [facility] INFUSION [facility] None    No orders of the defined types were placed in this encounter.      Subjective:   Patient ID:  Barbara Watkins is a 58 y.o. (DOB 01-Apr-1961) female.  Chief Complaint:  Chief Complaint  Patient presents with  . Back Pain    HPI Barbara Watkins walked into [facility] today due to acute onset of back pain last night which is mid scapula and extends bilaterally across her back in a  vertical plane. She states the pain makes it difficult to take a deep breath. She denies fever, chills, chest pain, numbness, jaw pain, sweats, nausea, vomiting or any other symptom. She states she has been picking her 30 lb grandson up a lot. She has not tried to take anything for it. She does have Tramadol at home. She states there have been times when she laid a certain way in the bed and the pain did not hurt then.  Medications: I have reviewed the patient's current medications.  Allergies:  Allergies  Allergen Reactions  . Percocet [Oxycodone-Acetaminophen] Other (See Comments)    Chest pressure and back, diaphoretic, lightheaded    Past Medical History:  Diagnosis Date  . 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 GERD (gastroesophageal reflux disease)   . 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 Peripheral vascular disease [facility])     Past Surgical History:  Procedure Laterality Date  . ABDOMINAL AORTOGRAM W/LOWER EXTREMITY N/A 04/11/2019   Procedure: ABDOMINAL AORTOGRAM W/LOWER EXTREMITY;  Surgeon: Marty Heck, MD;  Location: [place] [facility] LAB;  Service: Cardiovascular;  Laterality: N/A;  . CESAREAN SECTION    . PERIPHERAL VASCULAR INTERVENTION Left 04/11/2019   Procedure: PERIPHERAL VASCULAR INTERVENTION;  Surgeon: Marty Heck, MD;  Location: [place] [facility];  Service: Cardiovascular;  Laterality: Left;  common iliac  . TRANSMETATARSAL AMPUTATION Left 05/06/2019   Procedure: TRANSMETATARSAL AMPUTATION  LEFT FOOT;  Surgeon: Elam Dutch, MD;  Location: [facility] OR;  Service: Vascular;  Laterality: Left;    Family History  Problem Relation Age of Onset  . Cancer Mother   . Cancer Father     Social History   Socioeconomic History  . Marital status: Single    Spouse name: Not on file  . Number of children: 2  . Years of education: Not on file  . Highest education level: Not on file  Occupational History  . Not  on file  Tobacco Use  . Smoking status: Former Smoker    Packs/day: 0.25    Years: 35.00    Pack years: 8.75    Types: Cigarettes    Quit date: 03/21/2019    Years since quitting: 1.5  . Smokeless tobacco: Never Used  Vaping Use  . Vaping Use: Never used  Substance and Sexual Activity  . Alcohol use: Not Currently  . Drug use: Not Currently  . Sexual activity: Not on file  Other Topics Concern  . Not on file  Social History Narrative   Patient lives with daughter.    Social Determinants of Health   Financial Resource Strain:   . Difficulty of Paying Living Expenses: Not on file  Food Insecurity:   . Worried About Charity fundraiser in the Last Year: Not on file  . Ran Out of Food in the Last Year: Not on file  Transportation Needs:   . Lack of Transportation (Medical): Not on file  . Lack of Transportation (Non-Medical): Not on file  Physical Activity:   . Days of Exercise per Week: Not on file  . Minutes of Exercise per Session: Not on file  Stress:   . Feeling of Stress : Not on file  Social Connections:   . Frequency of Communication with Friends and Family: Not on file  . Frequency of Social Gatherings with Friends and Family: Not on file  . Attends Religious Services: Not on file  . Active Member of Clubs or Organizations: Not on file  . Attends Archivist Meetings: Not on file  . Marital Status: Not on file  Intimate Partner Violence:   . Fear of Current or Ex-Partner: Not on file  . Emotionally Abused: Not on file  . Physically Abused: Not on file  . Sexually Abused: Not on file    Past Medical History, Surgical history, Social history, and Family history were reviewed and updated as appropriate.   Please see review of systems for further details on the patient's review from today.   Review of Systems: complaining of pain "8-9" along mid back, with some shortness of breath due to pain when she takes a deep breath. ROS otherwise  negative.  Objective:   Physical Exam:  BP (!) 151/81 (BP Location: Left Arm, Patient Position: Sitting)   Pulse 100   Temp 97.8 F (36.6 C)   Resp 18   Ht 5' 4"  (1.626 m)   Wt 120 lb 9.6 oz (54.7 kg)   SpO2 96%   BMI 20.70 kg/m  ECOG: 0  Physical Exam Vitals reviewed.  Constitutional:      Appearance: Normal appearance.  HENT:     Head: Normocephalic and atraumatic.  Musculoskeletal:     Cervical back: Normal range of motion and neck supple.     Comments: Back pain/tenderness noted mid scapula, mid back, extending vertically bilaterally just below shoulder bones  Skin:    General: Skin is warm  and dry.     Comments: Dime sized sore noted mid back along spine; pt states she has been scratching it   Neurological:     Mental Status: She is alert.     Lab Review:     Component Value Date/Time   NA 141 09/24/2020 1148   K 4.0 09/24/2020 1148   CL 109 09/24/2020 1148   CO2 26 09/24/2020 1148   GLUCOSE 78 09/24/2020 1148   BUN 16 09/24/2020 1148   CREATININE 1.29 (H) 09/24/2020 1148   CALCIUM 10.6 (H) 09/24/2020 1148   CALCIUM 11.0 (H) 06/05/2020 1431   PROT 7.6 09/24/2020 1148   ALBUMIN 4.0 09/24/2020 1148   AST 15 09/24/2020 1148   ALT 9 09/24/2020 1148   ALKPHOS 99 09/24/2020 1148   BILITOT 0.4 09/24/2020 1148   GFRNONAA 45 (L) 09/24/2020 1148   GFRAA 52 (L) 09/24/2020 1148       Component Value Date/Time   WBC 6.5 09/24/2020 1148   WBC 19.6 (H) 06/15/2019 0236   RBC 4.49 09/24/2020 1148   HGB 13.4 09/24/2020 1148   HCT 41.0 09/24/2020 1148   PLT 226 09/24/2020 1148   MCV 91.3 09/24/2020 1148   MCH 29.8 09/24/2020 1148   MCHC 32.7 09/24/2020 1148   RDW 13.5 09/24/2020 1148   LYMPHSABS 2.6 09/24/2020 1148   MONOABS 0.4 09/24/2020 1148   EOSABS 0.3 09/24/2020 1148   BASOSABS 0.1 09/24/2020 1148   -------------------------------  Imaging from last 24 hours (if applicable): Xray taken today of thoracic spine - results report pending at time of  dictation.  Radiology interpretation: No results found.  This was a shared visit with Burns Spain.  Ms. Hadley was interviewed and examined.  She has acute pain in the mid thoracic spine.  The pain is positional.  The differential diagnosis includes a compression fracture-benign or malignant, or another benign musculoskeletal condition.  We have a low clinical suspicion for a primary cardiopulmonary process.  She will be referred for plain x-rays of the thoracic spine.  She will contact us if the pain does not improve.  [name], MD

## 2020-10-12 ENCOUNTER — Other Ambulatory Visit: Payer: Self-pay | Admitting: *Deleted

## 2020-10-12 ENCOUNTER — Telehealth: Payer: Self-pay | Admitting: Emergency Medicine

## 2020-10-12 DIAGNOSIS — C3492 Malignant neoplasm of unspecified part of left bronchus or lung: Secondary | ICD-10-CM

## 2020-10-12 NOTE — Telephone Encounter (Signed)
Reported negative results of spine XR per NP Burns Spain to pt who verbalized understanding and denies any further questions/concerns at this time.  Pt reports that back pain is much better now.

## 2020-10-12 NOTE — Progress Notes (Signed)
Patient does not have a PCP and agrees to establish w/[facility]. Called and confirmed they are accepting new patients. Put referral order in Epic and they will see it.

## 2020-10-15 ENCOUNTER — Inpatient Hospital Stay: Payer: Medicaid Other

## 2020-10-15 ENCOUNTER — Other Ambulatory Visit: Payer: Self-pay

## 2020-10-15 ENCOUNTER — Telehealth: Payer: Self-pay | Admitting: *Deleted

## 2020-10-15 DIAGNOSIS — C3412 Malignant neoplasm of upper lobe, left bronchus or lung: Secondary | ICD-10-CM | POA: Diagnosis not present

## 2020-10-15 DIAGNOSIS — Z23 Encounter for immunization: Secondary | ICD-10-CM

## 2020-10-15 NOTE — Telephone Encounter (Signed)
Asking for something to help her sleep at night. Has noisy neighbors who are up at night and slam doors and wake her up. She has not tried any OTC yet. Suggested she try Benadryl 25 mg or Melatonin 10 mg at bedtime. Suggested she have a box fan to run at night to help drown out noise. Reports she lives in [address] housing and there is nothing she can do about the neighbors.

## 2020-10-15 NOTE — Progress Notes (Signed)
Pt stayed for 15 min post covid shot observation period, tolerated well.  Denies any questions/concerns at time of d/c.

## 2020-11-04 ENCOUNTER — Other Ambulatory Visit: Payer: Self-pay | Admitting: Oncology

## 2020-11-05 ENCOUNTER — Inpatient Hospital Stay: Payer: Medicaid Other

## 2020-11-05 ENCOUNTER — Inpatient Hospital Stay (HOSPITAL_BASED_OUTPATIENT_CLINIC_OR_DEPARTMENT_OTHER): Payer: Medicaid Other | Admitting: Nurse Practitioner

## 2020-11-05 ENCOUNTER — Encounter: Payer: Self-pay | Admitting: Nurse Practitioner

## 2020-11-05 ENCOUNTER — Other Ambulatory Visit: Payer: Self-pay

## 2020-11-05 ENCOUNTER — Inpatient Hospital Stay: Payer: Medicaid Other | Attending: Nurse Practitioner

## 2020-11-05 VITALS — BP 151/81 | HR 87 | Temp 97.8°F | Resp 16 | Ht 64.0 in | Wt 122.1 lb

## 2020-11-05 DIAGNOSIS — Z23 Encounter for immunization: Secondary | ICD-10-CM

## 2020-11-05 DIAGNOSIS — K5900 Constipation, unspecified: Secondary | ICD-10-CM | POA: Diagnosis not present

## 2020-11-05 DIAGNOSIS — C3412 Malignant neoplasm of upper lobe, left bronchus or lung: Secondary | ICD-10-CM | POA: Insufficient documentation

## 2020-11-05 DIAGNOSIS — R110 Nausea: Secondary | ICD-10-CM | POA: Insufficient documentation

## 2020-11-05 DIAGNOSIS — Z5112 Encounter for antineoplastic immunotherapy: Secondary | ICD-10-CM | POA: Insufficient documentation

## 2020-11-05 DIAGNOSIS — C3492 Malignant neoplasm of unspecified part of left bronchus or lung: Secondary | ICD-10-CM

## 2020-11-05 DIAGNOSIS — C787 Secondary malignant neoplasm of liver and intrahepatic bile duct: Secondary | ICD-10-CM | POA: Diagnosis not present

## 2020-11-05 DIAGNOSIS — Z79899 Other long term (current) drug therapy: Secondary | ICD-10-CM | POA: Diagnosis not present

## 2020-11-05 DIAGNOSIS — D649 Anemia, unspecified: Secondary | ICD-10-CM | POA: Insufficient documentation

## 2020-11-05 DIAGNOSIS — N390 Urinary tract infection, site not specified: Secondary | ICD-10-CM | POA: Diagnosis not present

## 2020-11-05 DIAGNOSIS — C7902 Secondary malignant neoplasm of left kidney and renal pelvis: Secondary | ICD-10-CM | POA: Insufficient documentation

## 2020-11-05 LAB — CMP (CANCER CENTER ONLY)
ALT: 7 U/L (ref 0–44)
AST: 14 U/L — ABNORMAL LOW (ref 15–41)
Albumin: 4 g/dL (ref 3.5–5.0)
Alkaline Phosphatase: 88 U/L (ref 38–126)
Anion gap: 9 (ref 5–15)
BUN: 15 mg/dL (ref 6–20)
CO2: 23 mmol/L (ref 22–32)
Calcium: 10.1 mg/dL (ref 8.9–10.3)
Chloride: 108 mmol/L (ref 98–111)
Creatinine: 1.35 mg/dL — ABNORMAL HIGH (ref 0.44–1.00)
GFR, Estimated: 45 mL/min — ABNORMAL LOW (ref >60)
Glucose, Bld: 88 mg/dL (ref 70–99)
Potassium: 4 mmol/L (ref 3.5–5.1)
Sodium: 140 mmol/L (ref 135–145)
Total Bilirubin: 0.5 mg/dL (ref 0.3–1.2)
Total Protein: 7.2 g/dL (ref 6.5–8.1)

## 2020-11-05 MED ORDER — INFLUENZA VAC SPLIT QUAD 0.5 ML IM SUSY
PREFILLED_SYRINGE | INTRAMUSCULAR | Status: AC
  Filled 2020-11-05: qty 0.5

## 2020-11-05 MED ORDER — INFLUENZA VAC SPLIT QUAD 0.5 ML IM SUSY
0.5000 mL | PREFILLED_SYRINGE | Freq: Once | INTRAMUSCULAR | Status: AC
  Administered 2020-11-05: 0.5 mL via INTRAMUSCULAR

## 2020-11-05 MED ORDER — SODIUM CHLORIDE 0.9 % IV SOLN
Freq: Once | INTRAVENOUS | Status: AC
  Filled 2020-11-05: qty 250

## 2020-11-05 MED ORDER — SODIUM CHLORIDE 0.9 % IV SOLN
400.0000 mg | Freq: Once | INTRAVENOUS | Status: AC
  Administered 2020-11-05: 400 mg via INTRAVENOUS
  Filled 2020-11-05: qty 16

## 2020-11-05 NOTE — Patient Instructions (Addendum)
[facility] Discharge Instructions for Patients Receiving Chemotherapy  Today you received the following chemotherapy agents: Keytruda  To help prevent nausea and vomiting after your treatment, we encourage you to take your nausea medication as directed.    If you develop nausea and vomiting that is not controlled by your nausea medication, call the clinic.   BELOW ARE SYMPTOMS THAT SHOULD BE REPORTED IMMEDIATELY:  *FEVER GREATER THAN 100.5 F  *CHILLS WITH OR WITHOUT FEVER  NAUSEA AND VOMITING THAT IS NOT CONTROLLED WITH YOUR NAUSEA MEDICATION  *UNUSUAL SHORTNESS OF BREATH  *UNUSUAL BRUISING OR BLEEDING  TENDERNESS IN MOUTH AND THROAT WITH OR WITHOUT PRESENCE OF ULCERS  *URINARY PROBLEMS  *BOWEL PROBLEMS  UNUSUAL RASH Items with * indicate a potential emergency and should be followed up as soon as possible.  Feel free to call the clinic should you have any questions or concerns. The clinic phone number is [phone].  Please show the [facility] at check-in to the Emergency Department and triage nurse.  Influenza Virus Vaccine injection What is this medicine? INFLUENZA VIRUS VACCINE (in floo EN zuh VAHY ruhs vak SEEN) helps to reduce the risk of getting influenza also known as the flu. The vaccine only helps protect you against some strains of the flu. This medicine may be used for other purposes; ask your health care provider or pharmacist if you have questions. COMMON BRAND NAME(S): Afluria, Afluria Quadrivalent, Agriflu, Alfuria, FLUAD, Fluarix, Fluarix Quadrivalent, Flublok, Flublok Quadrivalent, FLUCELVAX, FLUCELVAX Quadrivalent, Flulaval, Flulaval Quadrivalent, Fluvirin, Fluzone, Fluzone High-Dose, Fluzone Intradermal, Fluzone Quadrivalent What should I tell my health care provider before I take this medicine? They need to know if you have any of these conditions:  bleeding disorder like hemophilia  fever or infection  Guillain-Barre syndrome  or other neurological problems  immune system problems  infection with the human immunodeficiency virus (HIV) or AIDS  low blood platelet counts  multiple sclerosis  an unusual or allergic reaction to influenza virus vaccine, latex, other medicines, foods, dyes, or preservatives. Different brands of vaccines contain different allergens. Some may contain latex or eggs. Talk to your doctor about your allergies to make sure that you get the right vaccine.  pregnant or trying to get pregnant  breast-feeding How should I use this medicine? This vaccine is for injection into a muscle or under the skin. It is given by a health care professional. A copy of Vaccine Information Statements will be given before each vaccination. Read this sheet carefully each time. The sheet may change frequently. Talk to your healthcare provider to see which vaccines are right for you. Some vaccines should not be used in all age groups. Overdosage: If you think you have taken too much of this medicine contact a poison control center or emergency room at once. NOTE: This medicine is only for you. Do not share this medicine with others. What if I miss a dose? This does not apply. What may interact with this medicine?  chemotherapy or radiation therapy  medicines that lower your immune system like etanercept, anakinra, infliximab, and adalimumab  medicines that treat or prevent blood clots like warfarin  phenytoin  steroid medicines like prednisone or cortisone  theophylline  vaccines This list may not describe all possible interactions. Give your health care provider a list of all the medicines, herbs, non-prescription drugs, or dietary supplements you use. Also tell them if you smoke, drink alcohol, or use illegal drugs. Some items may interact with your medicine. What  should I watch for while using this medicine? Report any side effects that do not go away within 3 days to your doctor or health care  professional. Call your health care provider if any unusual symptoms occur within 6 weeks of receiving this vaccine. You may still catch the flu, but the illness is not usually as bad. You cannot get the flu from the vaccine. The vaccine will not protect against colds or other illnesses that may cause fever. The vaccine is needed every year. What side effects may I notice from receiving this medicine? Side effects that you should report to your doctor or health care professional as soon as possible:  allergic reactions like skin rash, itching or hives, swelling of the face, lips, or tongue Side effects that usually do not require medical attention (report to your doctor or health care professional if they continue or are bothersome):  fever  headache  muscle aches and pains  pain, tenderness, redness, or swelling at the injection site  tiredness This list may not describe all possible side effects. Call your doctor for medical advice about side effects. You may report side effects to FDA at 1-800-FDA-1088. Where should I keep my medicine? The vaccine will be given by a health care professional in a clinic, pharmacy, doctor's office, or other health care setting. You will not be given vaccine doses to store at home. NOTE: This sheet is a summary. It may not cover all possible information. If you have questions about this medicine, talk to your doctor, pharmacist, or health care provider.  2020 Elsevier/Gold Standard (2018-11-09 08:45:43)

## 2020-11-05 NOTE — Progress Notes (Signed)
Barbara Watkins OFFICE PROGRESS NOTE   Diagnosis: Non-small cell lung cancer  INTERVAL HISTORY:   Barbara Watkins returns as scheduled.  She completed another cycle of Pembrolizumab 09/24/2020.  She feels well. No nausea or vomiting.  No rash.  No diarrhea.  She denies pain.  She is no longer having back pain.  No fever, cough, shortness of breath.  Objective:  Vital signs in last 24 hours:  Blood pressure (!) 151/81, pulse 87, temperature 97.8 F (36.6 C), temperature source Tympanic, resp. rate 16, height 5' 4"  (1.626 m), weight 122 lb 1.6 oz (55.4 kg), SpO2 100 %.   Resp: Lungs clear bilaterally. Cardio: Regular rate and rhythm. GI: Abdomen soft and nontender.  No hepatosplenomegaly. Vascular: No leg edema.  Skin: No rash.   Lab Results:  Lab Results  Component Value Date   WBC 6.5 09/24/2020   HGB 13.4 09/24/2020   HCT 41.0 09/24/2020   MCV 91.3 09/24/2020   PLT 226 09/24/2020   NEUTROABS 3.1 09/24/2020    Imaging:  No results found.  Medications: I have reviewed the patient's current medications.  Assessment/Plan: 1.Metastatic non-small cell lung cancer   CTabdomen/pelvis 06/12/2019-scattered soft tissue masses in left hepatic lobe, right kidney, and the right adrenal gland. Fullness of the left hilum suspicious for left hilar mass and small nodules at the right lung base. Also noted signs of right pyelonephritis.  CT chest 06/13/2019-large encasing soft tissue mass left hilum measuring at least 5.8 x 4.5 x 3.8 cm with narrowing of the left mainstem bronchus and left pulmonary artery. Near-total obstructive atelectasis of the left upper lobe with a portion of the lingula remaining aerated. Multiple bilateral pulmonary nodules.  Biopsy left supraclavicular lymph node 06/15/2019-metastatic lung adenocarcinoma; immunohistochemistry positive for cytokeratin 7, TTF-1, PAX 8 (weak). Napsin-A and CDX-2negative.Foundation 1-microsatellite stable, tumor  mutational burden 15, ATM alteration, KRAS G13D;tumor proportion score 100%  Brain CT 06/29/2019-negative for metastatic disease  Cycle 1 carboplatin/Alimta/pembrolizumab 06/29/2019  Cycle 2 carboplatin/Alimta/pembrolizumab 07/20/2019  Cycle 3 carboplatin/Alimta 08/10/2019, pembrolizumab held due to rash  Cycle 4 carboplatin/Alimta, Udenyca added, pembrolizumab held9/01/2019  Cycle 5 Alimta alone (carboplatin discontinued, pembrolizumab held)09/22/2019  CTs 10/06/2019-decrease in size of central left upper lobe pulmonary mass; bilateral pulmonary nodules of varying size some of which are irregular in shape; many of these are stable in the interval. Some of the right lung nodules have decreased/resolved since the prior study. Some new irregular nodules in the left lower lobe indeterminate but potentially infectious/inflammatory. Marked interval decrease in size of left hepatic and right renal metastases. Metastatic retroperitoneal lymphadenopathy in the abdomen has clearly decreased. Metastatic nodules in the right omentum and perirenal fat bilaterally have decreased/resolved. No new or progressive findings.  Cycle 6 Alimta/pembrolizumab 10/17/2019  Cycle 7 Alimta/pembrolizumab 11/08/2019  Cycle 8 Alimta/pembrolizumab   Cycle 9 Pembrolizumab 12/19/2019 (Alimta held due to elevated creatinine)  Cycle 10 Pembrolizumab 01/10/2020 (Alimta held due to borderline creatinine)  CTs 01/26/2020-decreased left hilar soft tissue, persistent mass effect on the left upper lobe pulmonary artery, medial left upper lobe pulmonary lesion has decreased in size with further reexpansion of the left upper lobe, 5 mm right upper lobe nodule more conspicuous, new spiculated nodular densities in the left lower lobe on previous exam have almost completely resolved, decreased left hepatic lesion, decreased right renal lesion, resolved right adrenal lesion decreased retroperitoneal lymphadenopathy  Cycle 11  Alimta/pembrolizumab 01/31/2020  Cycle 12 Alimta/pembrolizumab 02/21/2020  Cycle 13 Alimta/pembrolizumab 03/13/2020, Aloxi added for delayed nausea  Cycle  14 Alimta/pembrolizumab 04/02/2020, Aloxi plus Emend  Cycle 15 pembrolizumab alone 04/27/2020 (Alimta held due to elevated creatinine)  Cycle 16 pembrolizumab alone 05/18/2020  CTs 06/05/2020-stable exam. No significant change in size of medial left upper lobe lung mass. Unchanged appearance of left hepatic lobe metastasis. Slight decrease in size of right kidney lesion. Small pulmonary nodules stable to minimally decreased. No new or progressive disease.  Cycle 17 pembrolizumab 06/06/2020 (6-week dosing)  Cycle 18 Pembrolizumab 08/13/2020 (6-week dosing)  Cycle 19 pembrolizumab 09/24/2020 (6-week dosing)  Cycle 20 Pembrolizumab 11/05/2020 (6-week dosing) 2. Anemia 3.Urinary tract infection 4. 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 Nausea 6. Constipation 7.Right abdominal pain secondary to #1 8.Skin rashbilateral forearms, grade 2, likely related to pembrolizumab7/22/2020; grade 3 08/03/2019, prednisone initiated; rash improved 08/10/2019, prednisone decreased to 10 mg daily x7 days then discontinued 9.  Nodular right lip lesion 08/13/2020, referred to dermatology   Disposition: Barbara Watkins appears well.  She has completed 19 cycles of Pembrolizumab, now on a 6-week dosing schedule.  There is no clinical evidence of disease progression.  Plan to proceed with cycle 20 today as scheduled.  Restaging CTs prior to her next visit.  She will return for follow-up and Pembrolizumab in 6 weeks, lab and restaging CTs a few days prior.    Ned Card ANP/GNP-BC   11/05/2020  1:56 PM

## 2020-11-05 NOTE — Progress Notes (Signed)
Per Ned Card NP, of to use CBC from 9/27 for today's treatment.

## 2020-11-06 ENCOUNTER — Telehealth: Payer: Self-pay | Admitting: Nurse Practitioner

## 2020-11-06 NOTE — Telephone Encounter (Signed)
Scheduled appointments per 11/8 los. Called patient, no answer. Left message for patient with appointments dates and times.

## 2020-11-14 ENCOUNTER — Telehealth: Payer: Self-pay | Admitting: *Deleted

## 2020-11-14 NOTE — Telephone Encounter (Signed)
Called to f/u on her dermatology referral for lip lesion. She reports she did not need to go. Purchased OTC wart remover and the lesion is gone.

## 2020-11-23 ENCOUNTER — Telehealth: Payer: Self-pay | Admitting: *Deleted

## 2020-11-23 NOTE — Telephone Encounter (Signed)
Called patient to f/u on referral sent in October to [facility] to see if they had called her yet. She says no, but noticed later that her Medicaid card has on the front [facility] on it. She agrees to allow referral there to establish for primary care. Faxed demographics and records to [phone] for referral to establish for primary care.

## 2020-12-13 ENCOUNTER — Other Ambulatory Visit: Payer: Medicaid Other

## 2020-12-13 ENCOUNTER — Inpatient Hospital Stay: Payer: Medicaid Other | Attending: Nurse Practitioner

## 2020-12-13 ENCOUNTER — Ambulatory Visit (HOSPITAL_COMMUNITY)
Admission: RE | Admit: 2020-12-13 | Discharge: 2020-12-13 | Disposition: A | Discharge status: Home / Self Care | Payer: Medicaid Other | Source: Ambulatory Visit | Attending: Nurse Practitioner | Admitting: Nurse Practitioner

## 2020-12-13 ENCOUNTER — Other Ambulatory Visit: Payer: Self-pay

## 2020-12-13 DIAGNOSIS — C3492 Malignant neoplasm of unspecified part of left bronchus or lung: Secondary | ICD-10-CM

## 2020-12-13 DIAGNOSIS — Z79899 Other long term (current) drug therapy: Secondary | ICD-10-CM | POA: Diagnosis not present

## 2020-12-13 DIAGNOSIS — D649 Anemia, unspecified: Secondary | ICD-10-CM | POA: Insufficient documentation

## 2020-12-13 DIAGNOSIS — N390 Urinary tract infection, site not specified: Secondary | ICD-10-CM | POA: Diagnosis not present

## 2020-12-13 DIAGNOSIS — C787 Secondary malignant neoplasm of liver and intrahepatic bile duct: Secondary | ICD-10-CM | POA: Diagnosis not present

## 2020-12-13 DIAGNOSIS — Z9221 Personal history of antineoplastic chemotherapy: Secondary | ICD-10-CM | POA: Insufficient documentation

## 2020-12-13 DIAGNOSIS — C7902 Secondary malignant neoplasm of left kidney and renal pelvis: Secondary | ICD-10-CM | POA: Insufficient documentation

## 2020-12-13 DIAGNOSIS — Z5112 Encounter for antineoplastic immunotherapy: Secondary | ICD-10-CM | POA: Insufficient documentation

## 2020-12-13 DIAGNOSIS — C3412 Malignant neoplasm of upper lobe, left bronchus or lung: Secondary | ICD-10-CM | POA: Diagnosis present

## 2020-12-13 LAB — CMP (CANCER CENTER ONLY)
ALT: 10 U/L (ref 0–44)
AST: 16 U/L (ref 15–41)
Albumin: 4.3 g/dL (ref 3.5–5.0)
Alkaline Phosphatase: 98 U/L (ref 38–126)
Anion gap: 8 (ref 5–15)
BUN: 14 mg/dL (ref 6–20)
CO2: 26 mmol/L (ref 22–32)
Calcium: 10.8 mg/dL — ABNORMAL HIGH (ref 8.9–10.3)
Chloride: 106 mmol/L (ref 98–111)
Creatinine: 1.39 mg/dL — ABNORMAL HIGH (ref 0.44–1.00)
GFR, Estimated: 43 mL/min — ABNORMAL LOW (ref >60)
Glucose, Bld: 87 mg/dL (ref 70–99)
Potassium: 4.4 mmol/L (ref 3.5–5.1)
Sodium: 140 mmol/L (ref 135–145)
Total Bilirubin: 0.4 mg/dL (ref 0.3–1.2)
Total Protein: 7.7 g/dL (ref 6.5–8.1)

## 2020-12-13 LAB — CBC WITH DIFFERENTIAL (CANCER CENTER ONLY)
Abs Immature Granulocytes: 0.01 10*3/uL (ref 0.00–0.07)
Basophils Absolute: 0.1 10*3/uL (ref 0.0–0.1)
Basophils Relative: 1 %
Eosinophils Absolute: 0.3 10*3/uL (ref 0.0–0.5)
Eosinophils Relative: 4 %
HCT: 42.5 % (ref 36.0–46.0)
Hemoglobin: 13.8 g/dL (ref 12.0–15.0)
Immature Granulocytes: 0 %
Lymphocytes Relative: 36 %
Lymphs Abs: 2.5 10*3/uL (ref 0.7–4.0)
MCH: 29.4 pg (ref 26.0–34.0)
MCHC: 32.5 g/dL (ref 30.0–36.0)
MCV: 90.6 fL (ref 80.0–100.0)
Monocytes Absolute: 0.5 10*3/uL (ref 0.1–1.0)
Monocytes Relative: 6 %
Neutro Abs: 3.6 10*3/uL (ref 1.7–7.7)
Neutrophils Relative %: 53 %
Platelet Count: 208 10*3/uL (ref 150–400)
RBC: 4.69 MIL/uL (ref 3.87–5.11)
RDW: 13.4 % (ref 11.5–15.5)
WBC Count: 7 10*3/uL (ref 4.0–10.5)
nRBC: 0 % (ref 0.0–0.2)

## 2020-12-13 LAB — TSH: TSH: 2.392 u[IU]/mL (ref 0.308–3.960)

## 2020-12-13 MED ORDER — SODIUM CHLORIDE (PF) 0.9 % IJ SOLN
INTRAMUSCULAR | Status: AC
  Filled 2020-12-13: qty 50

## 2020-12-13 MED ORDER — IOHEXOL 300 MG/ML  SOLN
75.0000 mL | Freq: Once | INTRAMUSCULAR | Status: AC | PRN
  Administered 2020-12-13: 14:00:00 75 mL via INTRAVENOUS

## 2020-12-16 ENCOUNTER — Other Ambulatory Visit: Payer: Self-pay | Admitting: Oncology

## 2020-12-17 ENCOUNTER — Other Ambulatory Visit: Payer: Self-pay

## 2020-12-17 ENCOUNTER — Inpatient Hospital Stay: Payer: Medicaid Other

## 2020-12-17 ENCOUNTER — Inpatient Hospital Stay (HOSPITAL_BASED_OUTPATIENT_CLINIC_OR_DEPARTMENT_OTHER): Payer: Medicaid Other | Admitting: Oncology

## 2020-12-17 VITALS — HR 75

## 2020-12-17 VITALS — BP 142/84 | HR 108 | Temp 97.8°F | Resp 20 | Ht 64.0 in | Wt 126.3 lb

## 2020-12-17 DIAGNOSIS — C3492 Malignant neoplasm of unspecified part of left bronchus or lung: Secondary | ICD-10-CM

## 2020-12-17 DIAGNOSIS — Z5112 Encounter for antineoplastic immunotherapy: Secondary | ICD-10-CM | POA: Diagnosis not present

## 2020-12-17 MED ORDER — SODIUM CHLORIDE 0.9 % IV SOLN
400.0000 mg | Freq: Once | INTRAVENOUS | Status: AC
  Administered 2020-12-17: 14:00:00 400 mg via INTRAVENOUS
  Filled 2020-12-17: qty 16

## 2020-12-17 MED ORDER — SODIUM CHLORIDE 0.9 % IV SOLN
Freq: Once | INTRAVENOUS | Status: AC
  Filled 2020-12-17: qty 250

## 2020-12-17 NOTE — Progress Notes (Signed)
[facility] OFFICE PROGRESS NOTE   Diagnosis: Non-small cell lung cancer  INTERVAL HISTORY:   [name] returns as scheduled.  She feels well.  Good appetite.  Intermittent chronic back pain.  No other complaint.  No rash or diarrhea.  Objective:  Vital signs in last 24 hours:  Blood pressure (!) 142/84, pulse (!) 108, temperature 97.8 F (36.6 C), temperature source Tympanic, resp. rate 20, height 5' 4"  (1.626 m), weight 126 lb 4.8 oz (57.3 kg), SpO2 96 %.    HEENT: Neck without mass Lymphatics: No cervical, supraclavicular, or axillary nodes Resp: Lungs clear bilaterally Cardio: Regular rate and rhythm GI: No hepatosplenomegaly Vascular: No leg edema      Lab Results:  Lab Results  Component Value Date   WBC 7.0 12/13/2020   HGB 13.8 12/13/2020   HCT 42.5 12/13/2020   MCV 90.6 12/13/2020   PLT 208 12/13/2020   NEUTROABS 3.6 12/13/2020    CMP  Lab Results  Component Value Date   NA 140 12/13/2020   K 4.4 12/13/2020   CL 106 12/13/2020   CO2 26 12/13/2020   GLUCOSE 87 12/13/2020   BUN 14 12/13/2020   CREATININE 1.39 (H) 12/13/2020   CALCIUM 10.8 (H) 12/13/2020   PROT 7.7 12/13/2020   ALBUMIN 4.3 12/13/2020   AST 16 12/13/2020   ALT 10 12/13/2020   ALKPHOS 98 12/13/2020   BILITOT 0.4 12/13/2020   GFRNONAA 43 (L) 12/13/2020   GFRAA 52 (L) 09/24/2020    No results found for: CEA1  No results found for: INR  Imaging:  CT Chest W Contrast  Result Date: 12/14/2020 CLINICAL DATA:  Non-small-cell lung cancer of the right lung. Restaging. EXAM: CT CHEST, ABDOMEN, AND PELVIS WITH CONTRAST TECHNIQUE: Multidetector CT imaging of the chest, abdomen and pelvis was performed following the standard protocol during bolus administration of intravenous contrast. CONTRAST:  57m OMNIPAQUE IOHEXOL 300 MG/ML  SOLN COMPARISON:  06/05/2020 FINDINGS: CT CHEST FINDINGS Cardiovascular: The heart size is normal. No substantial pericardial effusion. Coronary  artery calcification is evident. Atherosclerotic calcification is noted in the wall of the thoracic aorta. Mediastinum/Nodes: No mediastinal lymphadenopathy. There is no hilar lymphadenopathy. The esophagus has normal imaging features. There is no axillary lymphadenopathy. Lungs/Pleura: Centrilobular emphsyema noted. Post treatment scarring again noted medial left upper lobe. Masslike consolidative opacity measured previously at 3.9 x 2.7 cm now measures 3.9 x 2.2 cm. 6 mm left apical nodule on 28/6 is new in the interval. Subtle 7 mm ground-glass opacity anterior right upper lobe (64/6) is stable 4 mm right middle lobe pulmonary nodule is unchanged. 9 mm subtle ground-glass opacity in the paraspinal right lower lobe (78/6) is stable. Another 8 mm ground-glass nodule in the central left lower lobe on 84/6 is unchanged. Tiny peripheral right upper lobe nodule described previously has resolved in the interval. No pleural effusion. Musculoskeletal: No worrisome lytic or sclerotic osseous abnormality. CT ABDOMEN PELVIS FINDINGS Hepatobiliary: Left hepatic lobe metastatic lesion measures minimally smaller today at 5.1 x 4.6 compared to 5.2 x 5.2 cm previously. 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 No new suspicious hepatic abnormality on the current study. There is no evidence for gallstones, gallbladder wall thickening, or pericholecystic fluid. No intrahepatic or extrahepatic biliary dilation. Pancreas: No focal mass lesion. No dilatation of the main duct. No intraparenchymal cyst. No peripancreatic edema. Spleen: No splenomegaly. No focal mass lesion. Adrenals/Urinary Tract: No adrenal nodule or mass. Irregular cystic lesion with  adjacent parenchymal scarring in the interpolar right kidney measures 2.0 x 1.8 cm today given rim enhancement, neoplasm not excluded. Left kidney unremarkable. No evidence for hydroureter. The urinary bladder appears normal for the degree of distention.  Stomach/Bowel: Stomach is unremarkable. No gastric wall thickening. No evidence of outlet obstruction. Duodenum is normally positioned as is the ligament of Treitz. No small bowel wall thickening. No small bowel dilatation. The terminal ileum is normal. The appendix is normal. No gross colonic mass. No colonic wall thickening. Vascular/Lymphatic: There is abdominal aortic atherosclerosis without aneurysm. There is no gastrohepatic or hepatoduodenal ligament lymphadenopathy. No retroperitoneal or mesenteric lymphadenopathy. No pelvic sidewall lymphadenopathy. Reproductive: The uterus is unremarkable.  There is no adnexal mass. Other: No intraperitoneal free fluid. Musculoskeletal: No worrisome lytic or sclerotic osseous abnormality. IMPRESSION: 1. No substantial interval change in exam. There is a new 6 mm nodule in the left lung apex and close attention on follow-up recommended. 2. Slight interval decrease in size of the left hepatic lobe metastatic lesion. No new or progressive liver metastases on the current study. 3. Stable appearance of left upper lobe lung lesion and post treatment scarring medial left upper lobe. 4. Interval resolution of the tiny peripheral right upper lobe pulmonary nodule described previously. Other scattered bilateral solid and ground-glass pulmonary nodules are stable. 5. Irregular cystic lesion with with rim enhancement measures minimally smaller on today's exam. 6. Emphysema (ICD10-J43.9) and Aortic Atherosclerosis (ICD10-170.0) Electronically Signed   By: Misty Stanley M.D.   On: 12/14/2020 08:58   CT Abdomen Pelvis W Contrast  Result Date: 12/14/2020 CLINICAL DATA:  Non-small-cell lung cancer of the right lung. Restaging. EXAM: CT CHEST, ABDOMEN, AND PELVIS WITH CONTRAST TECHNIQUE: Multidetector CT imaging of the chest, abdomen and pelvis was performed following the standard protocol during bolus administration of intravenous contrast. CONTRAST:  6m OMNIPAQUE IOHEXOL 300 MG/ML   SOLN COMPARISON:  06/05/2020 FINDINGS: CT CHEST FINDINGS Cardiovascular: The heart size is normal. No substantial pericardial effusion. Coronary artery calcification is evident. Atherosclerotic calcification is noted in the wall of the thoracic aorta. Mediastinum/Nodes: No mediastinal lymphadenopathy. There is no hilar lymphadenopathy. The esophagus has normal imaging features. There is no axillary lymphadenopathy. Lungs/Pleura: Centrilobular emphsyema noted. Post treatment scarring again noted medial left upper lobe. Masslike consolidative opacity measured previously at 3.9 x 2.7 cm now measures 3.9 x 2.2 cm. 6 mm left apical nodule on 28/6 is new in the interval. Subtle 7 mm ground-glass opacity anterior right upper lobe (64/6) is stable 4 mm right middle lobe pulmonary nodule is unchanged. 9 mm subtle ground-glass opacity in the paraspinal right lower lobe (78/6) is stable. Another 8 mm ground-glass nodule in the central left lower lobe on 84/6 is unchanged. Tiny peripheral right upper lobe nodule described previously has resolved in the interval. No pleural effusion. Musculoskeletal: No worrisome lytic or sclerotic osseous abnormality. CT ABDOMEN PELVIS FINDINGS Hepatobiliary: Left hepatic lobe metastatic lesion measures minimally smaller today at 5.1 x 4.6 compared to 5.2 x 5.2 cm previously. 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 No new suspicious hepatic abnormality on the current study. There is no evidence for gallstones, gallbladder wall thickening, or pericholecystic fluid. No intrahepatic or extrahepatic biliary dilation. Pancreas: No focal mass lesion. No dilatation of the main duct. No intraparenchymal cyst. No peripancreatic edema. Spleen: No splenomegaly. No focal mass lesion. Adrenals/Urinary Tract: No adrenal nodule or mass. Irregular cystic lesion with adjacent parenchymal scarring in the interpolar right kidney measures 2.0  x 1.8 cm today given rim enhancement,  neoplasm not excluded. Left kidney unremarkable. No evidence for hydroureter. The urinary bladder appears normal for the degree of distention. Stomach/Bowel: Stomach is unremarkable. No gastric wall thickening. No evidence of outlet obstruction. Duodenum is normally positioned as is the ligament of Treitz. No small bowel wall thickening. No small bowel dilatation. The terminal ileum is normal. The appendix is normal. No gross colonic mass. No colonic wall thickening. Vascular/Lymphatic: There is abdominal aortic atherosclerosis without aneurysm. There is no gastrohepatic or hepatoduodenal ligament lymphadenopathy. No retroperitoneal or mesenteric lymphadenopathy. No pelvic sidewall lymphadenopathy. Reproductive: The uterus is unremarkable.  There is no adnexal mass. Other: No intraperitoneal free fluid. Musculoskeletal: No worrisome lytic or sclerotic osseous abnormality. IMPRESSION: 1. No substantial interval change in exam. There is a new 6 mm nodule in the left lung apex and close attention on follow-up recommended. 2. Slight interval decrease in size of the left hepatic lobe metastatic lesion. No new or progressive liver metastases on the current study. 3. Stable appearance of left upper lobe lung lesion and post treatment scarring medial left upper lobe. 4. Interval resolution of the tiny peripheral right upper lobe pulmonary nodule described previously. Other scattered bilateral solid and ground-glass pulmonary nodules are stable. 5. Irregular cystic lesion with with rim enhancement measures minimally smaller on today's exam. 6. Emphysema (ICD10-J43.9) and Aortic Atherosclerosis (ICD10-170.0) Electronically Signed   By: Misty Stanley M.D.   On: 12/14/2020 08:58    Medications: I have reviewed the patient's current medications.   Assessment/Plan: 1. Metastatic non-small cell lung cancer   CTabdomen/pelvis 06/12/2019-scattered soft tissue masses in left hepatic lobe, right kidney, and the right adrenal  gland. Fullness of the left hilum suspicious for left hilar mass and small nodules at the right lung base. Also noted signs of right pyelonephritis.  CT chest 06/13/2019-large encasing soft tissue mass left hilum measuring at least 5.8 x 4.5 x 3.8 cm with narrowing of the left mainstem bronchus and left pulmonary artery. Near-total obstructive atelectasis of the left upper lobe with a portion of the lingula remaining aerated. Multiple bilateral pulmonary nodules.  Biopsy left supraclavicular lymph node 06/15/2019-metastatic lung adenocarcinoma; immunohistochemistry positive for cytokeratin 7, TTF-1, PAX 8 (weak). Napsin-A and CDX-2negative.Foundation 1-microsatellite stable, tumor mutational burden 15, ATM alteration, KRAS G13D;tumor proportion score 100%  Brain CT 06/29/2019-negative for metastatic disease  Cycle 1 carboplatin/Alimta/pembrolizumab 06/29/2019  Cycle 2 carboplatin/Alimta/pembrolizumab 07/20/2019  Cycle 3 carboplatin/Alimta 08/10/2019, pembrolizumab held due to rash  Cycle 4 carboplatin/Alimta, Udenyca added, pembrolizumab held9/01/2019  Cycle 5 Alimta alone (carboplatin discontinued, pembrolizumab held)09/22/2019  CTs 10/06/2019-decrease in size of central left upper lobe pulmonary mass; bilateral pulmonary nodules of varying size some of which are irregular in shape; many of these are stable in the interval. Some of the right lung nodules have decreased/resolved since the prior study. Some new irregular nodules in the left lower lobe indeterminate but potentially infectious/inflammatory. Marked interval decrease in size of left hepatic and right renal metastases. Metastatic retroperitoneal lymphadenopathy in the abdomen has clearly decreased. Metastatic nodules in the right omentum and perirenal fat bilaterally have decreased/resolved. No new or progressive findings.  Cycle 6 Alimta/pembrolizumab 10/17/2019  Cycle 7 Alimta/pembrolizumab 11/08/2019  Cycle 8  Alimta/pembrolizumab   Cycle 9 Pembrolizumab 12/19/2019 (Alimta held due to elevated creatinine)  Cycle 10 Pembrolizumab 01/10/2020 (Alimta held due to borderline creatinine)  CTs 01/26/2020-decreased left hilar soft tissue, persistent mass effect on the left upper lobe pulmonary artery, medial left upper lobe pulmonary lesion has  decreased in size with further reexpansion of the left upper lobe, 5 mm right upper lobe nodule more conspicuous, new spiculated nodular densities in the left lower lobe on previous exam have almost completely resolved, decreased left hepatic lesion, decreased right renal lesion, resolved right adrenal lesion decreased retroperitoneal lymphadenopathy  Cycle 11 Alimta/pembrolizumab 01/31/2020  Cycle 12 Alimta/pembrolizumab 02/21/2020  Cycle 13 Alimta/pembrolizumab 03/13/2020, Aloxi added for delayed nausea  Cycle 14 Alimta/pembrolizumab 04/02/2020, Aloxi plus Emend  Cycle 15 pembrolizumab alone 04/27/2020 (Alimta held due to elevated creatinine)  Cycle 16 pembrolizumab alone 05/18/2020  CTs 06/05/2020-stable exam. No significant change in size of medial left upper lobe lung mass. Unchanged appearance of left hepatic lobe metastasis. Slight decrease in size of right kidney lesion. Small pulmonary nodules stable to minimally decreased. No new or progressive disease.  Cycle 17 pembrolizumab 06/06/2020 (6-week dosing)  Cycle 18 Pembrolizumab 08/13/2020 (6-week dosing)  Cycle 19 pembrolizumab 09/24/2020 (6-week dosing)  Cycle 20 Pembrolizumab 11/05/2020 (6-week dosing)  CTs 12/13/2020-slight decrease in left hepatic lobe metastases, stable appearance of left upper lung lesion, resolution of tiny peripheral right upper lobe nodule, scattered stable pulmonary nodules, new 6 mm left apical nodule  Cycle 21 pembrolizumab 12/17/2020 (6-week dosing) 2. Anemia 3.Urinary tract infection 4. 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 Nausea 6. Constipation 7.Right  abdominal pain secondary to #1 8.Skin rashbilateral forearms, grade 2, likely related to pembrolizumab7/22/2020; grade 3 08/03/2019, prednisone initiated; rash improved 08/10/2019, prednisone decreased to 10 mg daily x7 days then discontinued 9.  Nodular right lip lesion 08/13/2020, referred to dermatology 10.  Hypercalcemia-mild chronic elevation, normal PTH level 06/05/2020     Disposition: Ms. Bawa appears stable.  She is tolerating the pembrolizumab well.  There is no clinical evidence of disease progression.  The restaging CT reveals a small new left upper lung nodule and otherwise no evidence of progressive disease.  I reviewed the CT images with her.  The plan is to continue pembrolizumab.  The calcium remains mildly elevated.  The PTH level was normal in June.  The etiology of the mild hypercalcemia is unclear.  We will check a PTH related protein level today.  I doubt the elevated PTH is related to hypercalcemia of malignancy.  She will return for an office visit in the next cycle of pembrolizumab in 6 weeks.  Betsy Coder, MD  12/17/2020  12:54 PM

## 2020-12-17 NOTE — Patient Instructions (Signed)
[facility] Discharge Instructions for Patients Receiving Chemotherapy  Today you received the following chemotherapy agents: pembrolizumab.  To help prevent nausea and vomiting after your treatment, we encourage you to take your nausea medication as directed.   If you develop nausea and vomiting that is not controlled by your nausea medication, call the clinic.   BELOW ARE SYMPTOMS THAT SHOULD BE REPORTED IMMEDIATELY:  *FEVER GREATER THAN 100.5 F  *CHILLS WITH OR WITHOUT FEVER  NAUSEA AND VOMITING THAT IS NOT CONTROLLED WITH YOUR NAUSEA MEDICATION  *UNUSUAL SHORTNESS OF BREATH  *UNUSUAL BRUISING OR BLEEDING  TENDERNESS IN MOUTH AND THROAT WITH OR WITHOUT PRESENCE OF ULCERS  *URINARY PROBLEMS  *BOWEL PROBLEMS  UNUSUAL RASH Items with * indicate a potential emergency and should be followed up as soon as possible.  Feel free to call the clinic should you have any questions or concerns. The clinic phone number is [phone].  Please show the CHEMO ALERT CARD at check-in to the Emergency Department and triage nurse.   

## 2020-12-17 NOTE — Progress Notes (Signed)
Patient has not heard from [employer] [facility] [employer] to establish for primary care. Re-faxed referral that was sent on 11/23/20 and provided patient telephone # to make appointment.

## 2020-12-18 ENCOUNTER — Telehealth: Payer: Self-pay | Admitting: Nurse Practitioner

## 2020-12-18 NOTE — Telephone Encounter (Signed)
Scheduled per 12/20 los, patient has been called and notified.

## 2020-12-19 ENCOUNTER — Telehealth: Payer: Self-pay | Admitting: *Deleted

## 2020-12-19 NOTE — Telephone Encounter (Signed)
Patient called back and confirmed she will see PCP on 12/31/20 as scheduled.

## 2020-12-19 NOTE — Telephone Encounter (Signed)
Left VM for patient and her daughter of new patient appointment at [facility] for PCP. Appointment on 12/31/20 at 11:30 am. [address] Phone [phone]. Requested return call to confirm.

## 2021-01-21 ENCOUNTER — Other Ambulatory Visit: Payer: Self-pay | Admitting: Oncology

## 2021-01-28 ENCOUNTER — Other Ambulatory Visit: Payer: Self-pay

## 2021-01-28 ENCOUNTER — Inpatient Hospital Stay: Payer: Medicaid Other

## 2021-01-28 ENCOUNTER — Inpatient Hospital Stay: Payer: Medicaid Other | Attending: Nurse Practitioner

## 2021-01-28 ENCOUNTER — Encounter: Payer: Self-pay | Admitting: Nurse Practitioner

## 2021-01-28 ENCOUNTER — Inpatient Hospital Stay (HOSPITAL_BASED_OUTPATIENT_CLINIC_OR_DEPARTMENT_OTHER): Payer: Medicaid Other | Admitting: Nurse Practitioner

## 2021-01-28 VITALS — BP 152/82 | HR 86 | Temp 97.5°F | Resp 18 | Ht 64.0 in | Wt 129.0 lb

## 2021-01-28 DIAGNOSIS — C3492 Malignant neoplasm of unspecified part of left bronchus or lung: Secondary | ICD-10-CM | POA: Diagnosis not present

## 2021-01-28 DIAGNOSIS — C7902 Secondary malignant neoplasm of left kidney and renal pelvis: Secondary | ICD-10-CM | POA: Diagnosis not present

## 2021-01-28 DIAGNOSIS — D649 Anemia, unspecified: Secondary | ICD-10-CM | POA: Insufficient documentation

## 2021-01-28 DIAGNOSIS — C3412 Malignant neoplasm of upper lobe, left bronchus or lung: Secondary | ICD-10-CM | POA: Insufficient documentation

## 2021-01-28 DIAGNOSIS — Z9221 Personal history of antineoplastic chemotherapy: Secondary | ICD-10-CM | POA: Insufficient documentation

## 2021-01-28 DIAGNOSIS — N390 Urinary tract infection, site not specified: Secondary | ICD-10-CM | POA: Insufficient documentation

## 2021-01-28 DIAGNOSIS — C786 Secondary malignant neoplasm of retroperitoneum and peritoneum: Secondary | ICD-10-CM | POA: Insufficient documentation

## 2021-01-28 DIAGNOSIS — Z79899 Other long term (current) drug therapy: Secondary | ICD-10-CM | POA: Diagnosis not present

## 2021-01-28 DIAGNOSIS — C787 Secondary malignant neoplasm of liver and intrahepatic bile duct: Secondary | ICD-10-CM | POA: Insufficient documentation

## 2021-01-28 DIAGNOSIS — Z23 Encounter for immunization: Secondary | ICD-10-CM

## 2021-01-28 DIAGNOSIS — K5900 Constipation, unspecified: Secondary | ICD-10-CM | POA: Diagnosis not present

## 2021-01-28 DIAGNOSIS — Z5112 Encounter for antineoplastic immunotherapy: Secondary | ICD-10-CM | POA: Insufficient documentation

## 2021-01-28 LAB — CMP (CANCER CENTER ONLY)
ALT: 9 U/L (ref 0–44)
AST: 14 U/L — ABNORMAL LOW (ref 15–41)
Albumin: 4.2 g/dL (ref 3.5–5.0)
Alkaline Phosphatase: 89 U/L (ref 38–126)
Anion gap: 7 (ref 5–15)
BUN: 17 mg/dL (ref 6–20)
CO2: 24 mmol/L (ref 22–32)
Calcium: 10.3 mg/dL (ref 8.9–10.3)
Chloride: 108 mmol/L (ref 98–111)
Creatinine: 1.24 mg/dL — ABNORMAL HIGH (ref 0.44–1.00)
GFR, Estimated: 50 mL/min — ABNORMAL LOW (ref >60)
Glucose, Bld: 88 mg/dL (ref 70–99)
Potassium: 4.3 mmol/L (ref 3.5–5.1)
Sodium: 139 mmol/L (ref 135–145)
Total Bilirubin: 0.5 mg/dL (ref 0.3–1.2)
Total Protein: 7.4 g/dL (ref 6.5–8.1)

## 2021-01-28 LAB — CBC WITH DIFFERENTIAL (CANCER CENTER ONLY)
Abs Immature Granulocytes: 0.01 10*3/uL (ref 0.00–0.07)
Basophils Absolute: 0.1 10*3/uL (ref 0.0–0.1)
Basophils Relative: 1 %
Eosinophils Absolute: 0.2 10*3/uL (ref 0.0–0.5)
Eosinophils Relative: 3 %
HCT: 42.8 % (ref 36.0–46.0)
Hemoglobin: 14 g/dL (ref 12.0–15.0)
Immature Granulocytes: 0 %
Lymphocytes Relative: 39 %
Lymphs Abs: 2.4 10*3/uL (ref 0.7–4.0)
MCH: 29.7 pg (ref 26.0–34.0)
MCHC: 32.7 g/dL (ref 30.0–36.0)
MCV: 90.7 fL (ref 80.0–100.0)
Monocytes Absolute: 0.4 10*3/uL (ref 0.1–1.0)
Monocytes Relative: 6 %
Neutro Abs: 3.2 10*3/uL (ref 1.7–7.7)
Neutrophils Relative %: 51 %
Platelet Count: 197 10*3/uL (ref 150–400)
RBC: 4.72 MIL/uL (ref 3.87–5.11)
RDW: 13.3 % (ref 11.5–15.5)
WBC Count: 6.2 10*3/uL (ref 4.0–10.5)
nRBC: 0 % (ref 0.0–0.2)

## 2021-01-28 LAB — TSH: TSH: 2.923 u[IU]/mL (ref 0.308–3.960)

## 2021-01-28 MED ORDER — SODIUM CHLORIDE 0.9 % IV SOLN
400.0000 mg | Freq: Once | INTRAVENOUS | Status: AC
  Administered 2021-01-28: 400 mg via INTRAVENOUS
  Filled 2021-01-28: qty 16

## 2021-01-28 MED ORDER — SODIUM CHLORIDE 0.9 % IV SOLN
Freq: Once | INTRAVENOUS | Status: AC
  Filled 2021-01-28: qty 250

## 2021-01-28 NOTE — Patient Instructions (Signed)
[facility] Discharge Instructions for Patients Receiving Chemotherapy  Today you received the following chemotherapy agents: pembrolizumab.  To help prevent nausea and vomiting after your treatment, we encourage you to take your nausea medication as directed.   If you develop nausea and vomiting that is not controlled by your nausea medication, call the clinic.   BELOW ARE SYMPTOMS THAT SHOULD BE REPORTED IMMEDIATELY:  *FEVER GREATER THAN 100.5 F  *CHILLS WITH OR WITHOUT FEVER  NAUSEA AND VOMITING THAT IS NOT CONTROLLED WITH YOUR NAUSEA MEDICATION  *UNUSUAL SHORTNESS OF BREATH  *UNUSUAL BRUISING OR BLEEDING  TENDERNESS IN MOUTH AND THROAT WITH OR WITHOUT PRESENCE OF ULCERS  *URINARY PROBLEMS  *BOWEL PROBLEMS  UNUSUAL RASH Items with * indicate a potential emergency and should be followed up as soon as possible.  Feel free to call the clinic should you have any questions or concerns. The clinic phone number is [phone].  Please show the CHEMO ALERT CARD at check-in to the Emergency Department and triage nurse.   

## 2021-01-28 NOTE — Progress Notes (Signed)
Pt discharged in no apparent distress. Pt left ambulatory without assistance. Pt aware of discharge instructions and verbalized understanding and had no further questions.  

## 2021-01-28 NOTE — Progress Notes (Signed)
[place] OFFICE PROGRESS NOTE   Diagnosis: Non-small cell lung cancer  INTERVAL HISTORY:   [facility] Barbara Watkins returns as scheduled.  She completed another cycle of Pembrolizumab 12/17/2020.  She feels well.  No nausea or vomiting.  No mouth sores.  No diarrhea.  No skin rash.  She denies fever, cough, shortness of breath.  She has a good appetite.  No pain.  Objective:  Vital signs in last 24 hours:  Blood pressure (!) 152/82, pulse 86, temperature (!) 97.5 F (36.4 C), resp. rate 18, height 5' 4"  (1.626 m), weight 129 lb (58.5 kg), SpO2 100 %.    HEENT: No thrush or ulcers. Lymphatics: No palpable cervical or supraclavicular lymph nodes. Resp: Lungs clear bilaterally. Cardio: Regular rate and rhythm. GI: No hepatosplenomegaly.  Nontender. Vascular: No leg edema. Skin: No rash.   Lab Results:  Lab Results  Component Value Date   WBC 6.2 01/28/2021   HGB 14.0 01/28/2021   HCT 42.8 01/28/2021   MCV 90.7 01/28/2021   PLT 197 01/28/2021   NEUTROABS 3.2 01/28/2021    Imaging:  No results found.  Medications: I have reviewed the patient's current medications.  Assessment/Plan: 1. Metastatic non-small cell lung cancer   CTabdomen/pelvis 06/12/2019-scattered soft tissue masses in left hepatic lobe, right kidney, and the right adrenal gland. Fullness of the left hilum suspicious for left hilar mass and small nodules at the right lung base. Also noted signs of right pyelonephritis.  CT chest 06/13/2019-large encasing soft tissue mass left hilum measuring at least 5.8 x 4.5 x 3.8 cm with narrowing of the left mainstem bronchus and left pulmonary artery. Near-total obstructive atelectasis of the left upper lobe with a portion of the lingula remaining aerated. Multiple bilateral pulmonary nodules.  Biopsy left supraclavicular lymph node 06/15/2019-metastatic lung adenocarcinoma; immunohistochemistry positive for cytokeratin 7, TTF-1, PAX 8 (weak). Napsin-A  and CDX-2negative.Foundation 1-microsatellite stable, tumor mutational burden 15, ATM alteration, KRAS G13D;tumor proportion score 100%  Brain CT 06/29/2019-negative for metastatic disease  Cycle 1 carboplatin/Alimta/pembrolizumab 06/29/2019  Cycle 2 carboplatin/Alimta/pembrolizumab 07/20/2019  Cycle 3 carboplatin/Alimta 08/10/2019, pembrolizumab held due to rash  Cycle 4 carboplatin/Alimta, Udenyca added, pembrolizumab held9/01/2019  Cycle 5 Alimta alone (carboplatin discontinued, pembrolizumab held)09/22/2019  CTs 10/06/2019-decrease in size of central left upper lobe pulmonary mass; bilateral pulmonary nodules of varying size some of which are irregular in shape; many of these are stable in the interval. Some of the right lung nodules have decreased/resolved since the prior study. Some new irregular nodules in the left lower lobe indeterminate but potentially infectious/inflammatory. Marked interval decrease in size of left hepatic and right renal metastases. Metastatic retroperitoneal lymphadenopathy in the abdomen has clearly decreased. Metastatic nodules in the right omentum and perirenal fat bilaterally have decreased/resolved. No new or progressive findings.  Cycle 6 Alimta/pembrolizumab 10/17/2019  Cycle 7 Alimta/pembrolizumab 11/08/2019  Cycle 8 Alimta/pembrolizumab   Cycle 9 Pembrolizumab 12/19/2019 (Alimta held due to elevated creatinine)  Cycle 10 Pembrolizumab 01/10/2020 (Alimta held due to borderline creatinine)  CTs 01/26/2020-decreased left hilar soft tissue, persistent mass effect on the left upper lobe pulmonary artery, medial left upper lobe pulmonary lesion has decreased in size with further reexpansion of the left upper lobe, 5 mm right upper lobe nodule more conspicuous, new spiculated nodular densities in the left lower lobe on previous exam have almost completely resolved, decreased left hepatic lesion, decreased right renal lesion, resolved right adrenal lesion  decreased retroperitoneal lymphadenopathy  Cycle 11 Alimta/pembrolizumab 01/31/2020  Cycle 12 Alimta/pembrolizumab 02/21/2020  Cycle 13 Alimta/pembrolizumab 03/13/2020, Aloxi added for delayed nausea  Cycle 14 Alimta/pembrolizumab 04/02/2020, Aloxi plus Emend  Cycle 15 pembrolizumab alone 04/27/2020 (Alimta held due to elevated creatinine)  Cycle 16 pembrolizumab alone 05/18/2020  CTs 06/05/2020-stable exam. No significant change in size of medial left upper lobe lung mass. Unchanged appearance of left hepatic lobe metastasis. Slight decrease in size of right kidney lesion. Small pulmonary nodules stable to minimally decreased. No new or progressive disease.  Cycle 17 pembrolizumab 06/06/2020 (6-week dosing)  Cycle 18 Pembrolizumab 08/13/2020 (6-week dosing)  Cycle 19 pembrolizumab 09/24/2020 (6-week dosing)  Cycle 20 Pembrolizumab 11/05/2020 (6-week dosing)  CTs 12/13/2020-slight decrease in left hepatic lobe metastases, stable appearance of left upper lung lesion, resolution of tiny peripheral right upper lobe nodule, scattered stable pulmonary nodules, new 6 mm left apical nodule  Cycle 21 pembrolizumab 12/17/2020 (6-week dosing)  Cycle 22 Pembrolizumab 01/28/2021 2. Anemia 3.Urinary tract infection 4. 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 Nausea 6. Constipation 7.Right abdominal pain secondary to #1 8.Skin rashbilateral forearms, grade 2, likely related to pembrolizumab7/22/2020; grade 3 08/03/2019, prednisone initiated; rash improved 08/10/2019, prednisone decreased to 10 mg daily x7 days then discontinued 9. Nodular right lip lesion 08/13/2020, referred to dermatology 10.  Hypercalcemia-mild chronic elevation, normal PTH level 06/05/2020   Disposition: [name] appears well.  There is no clinical evidence of disease progression.  Plan to continue Pembrolizumab every 6 weeks.  CBC from today reviewed.  Blood counts adequate to proceed with treatment.   Chemistry panel is pending.  She will return for lab, follow-up, Pembrolizumab in 6 weeks.  She will receive the Covid booster today.    Ned Card ANP/GNP-BC   01/28/2021  2:18 PM

## 2021-01-29 ENCOUNTER — Telehealth: Payer: Self-pay | Admitting: Nurse Practitioner

## 2021-01-29 LAB — PTH, INTACT AND CALCIUM
Calcium, Total (PTH): 10.5 mg/dL — ABNORMAL HIGH (ref 8.7–10.3)
PTH: 33 pg/mL (ref 15–65)

## 2021-01-29 NOTE — Telephone Encounter (Signed)
Scheduled appointments per 1/31 los. Spoke to patient who is aware of appointments date and times.

## 2021-02-03 LAB — PTH-RELATED PEPTIDE: PTH-related peptide: <2 pmol/L

## 2021-03-10 ENCOUNTER — Other Ambulatory Visit: Payer: Self-pay | Admitting: Oncology

## 2021-03-11 ENCOUNTER — Other Ambulatory Visit: Payer: Self-pay | Admitting: Oncology

## 2021-03-11 ENCOUNTER — Other Ambulatory Visit: Payer: Self-pay

## 2021-03-11 ENCOUNTER — Inpatient Hospital Stay: Payer: Medicaid Other

## 2021-03-11 ENCOUNTER — Inpatient Hospital Stay (HOSPITAL_BASED_OUTPATIENT_CLINIC_OR_DEPARTMENT_OTHER): Payer: Medicaid Other | Admitting: Oncology

## 2021-03-11 ENCOUNTER — Inpatient Hospital Stay: Payer: Medicaid Other | Attending: Nurse Practitioner

## 2021-03-11 VITALS — BP 161/87 | HR 100 | Temp 98.1°F | Resp 20 | Ht 64.0 in | Wt 132.1 lb

## 2021-03-11 DIAGNOSIS — Z5112 Encounter for antineoplastic immunotherapy: Secondary | ICD-10-CM | POA: Diagnosis not present

## 2021-03-11 DIAGNOSIS — C3492 Malignant neoplasm of unspecified part of left bronchus or lung: Secondary | ICD-10-CM

## 2021-03-11 DIAGNOSIS — C7971 Secondary malignant neoplasm of right adrenal gland: Secondary | ICD-10-CM | POA: Insufficient documentation

## 2021-03-11 DIAGNOSIS — C787 Secondary malignant neoplasm of liver and intrahepatic bile duct: Secondary | ICD-10-CM | POA: Diagnosis not present

## 2021-03-11 DIAGNOSIS — C7902 Secondary malignant neoplasm of left kidney and renal pelvis: Secondary | ICD-10-CM | POA: Insufficient documentation

## 2021-03-11 DIAGNOSIS — D649 Anemia, unspecified: Secondary | ICD-10-CM | POA: Insufficient documentation

## 2021-03-11 DIAGNOSIS — C3412 Malignant neoplasm of upper lobe, left bronchus or lung: Secondary | ICD-10-CM | POA: Insufficient documentation

## 2021-03-11 LAB — CMP (CANCER CENTER ONLY)
ALT: 9 U/L (ref 0–44)
AST: 15 U/L (ref 15–41)
Albumin: 4.2 g/dL (ref 3.5–5.0)
Alkaline Phosphatase: 93 U/L (ref 38–126)
Anion gap: 9 (ref 5–15)
BUN: 15 mg/dL (ref 6–20)
CO2: 21 mmol/L — ABNORMAL LOW (ref 22–32)
Calcium: 10.2 mg/dL (ref 8.9–10.3)
Chloride: 107 mmol/L (ref 98–111)
Creatinine: 1.25 mg/dL — ABNORMAL HIGH (ref 0.44–1.00)
GFR, Estimated: 49 mL/min — ABNORMAL LOW (ref >60)
Glucose, Bld: 84 mg/dL (ref 70–99)
Potassium: 4.1 mmol/L (ref 3.5–5.1)
Sodium: 137 mmol/L (ref 135–145)
Total Bilirubin: 0.4 mg/dL (ref 0.3–1.2)
Total Protein: 7.5 g/dL (ref 6.5–8.1)

## 2021-03-11 LAB — CBC WITH DIFFERENTIAL (CANCER CENTER ONLY)
Abs Immature Granulocytes: 0.01 10*3/uL (ref 0.00–0.07)
Basophils Absolute: 0.1 10*3/uL (ref 0.0–0.1)
Basophils Relative: 2 %
Eosinophils Absolute: 0.4 10*3/uL (ref 0.0–0.5)
Eosinophils Relative: 5 %
HCT: 40.5 % (ref 36.0–46.0)
Hemoglobin: 13.6 g/dL (ref 12.0–15.0)
Immature Granulocytes: 0 %
Lymphocytes Relative: 40 %
Lymphs Abs: 2.7 10*3/uL (ref 0.7–4.0)
MCH: 30 pg (ref 26.0–34.0)
MCHC: 33.6 g/dL (ref 30.0–36.0)
MCV: 89.2 fL (ref 80.0–100.0)
Monocytes Absolute: 0.5 10*3/uL (ref 0.1–1.0)
Monocytes Relative: 7 %
Neutro Abs: 3.2 10*3/uL (ref 1.7–7.7)
Neutrophils Relative %: 46 %
Platelet Count: 211 10*3/uL (ref 150–400)
RBC: 4.54 MIL/uL (ref 3.87–5.11)
RDW: 13.3 % (ref 11.5–15.5)
WBC Count: 6.8 10*3/uL (ref 4.0–10.5)
nRBC: 0 % (ref 0.0–0.2)

## 2021-03-11 LAB — TSH: TSH: 3.985 u[IU]/mL — ABNORMAL HIGH (ref 0.308–3.960)

## 2021-03-11 MED ORDER — SODIUM CHLORIDE 0.9 % IV SOLN
400.0000 mg | Freq: Once | INTRAVENOUS | Status: AC
  Administered 2021-03-11: 400 mg via INTRAVENOUS
  Filled 2021-03-11: qty 16

## 2021-03-11 MED ORDER — TRAMADOL HCL 50 MG PO TABS: 25.0000 mg | ORAL_TABLET | Freq: Two times a day (BID) | ORAL | 1 refills | Status: DC | PRN

## 2021-03-11 MED ORDER — SODIUM CHLORIDE 0.9 % IV SOLN
Freq: Once | INTRAVENOUS | Status: AC
  Filled 2021-03-11: qty 250

## 2021-03-11 NOTE — Patient Instructions (Signed)
[facility] Discharge Instructions for Patients Receiving Chemotherapy  Today you received the following chemotherapy agents: pembrolizumab.  To help prevent nausea and vomiting after your treatment, we encourage you to take your nausea medication as directed.   If you develop nausea and vomiting that is not controlled by your nausea medication, call the clinic.   BELOW ARE SYMPTOMS THAT SHOULD BE REPORTED IMMEDIATELY:  *FEVER GREATER THAN 100.5 F  *CHILLS WITH OR WITHOUT FEVER  NAUSEA AND VOMITING THAT IS NOT CONTROLLED WITH YOUR NAUSEA MEDICATION  *UNUSUAL SHORTNESS OF BREATH  *UNUSUAL BRUISING OR BLEEDING  TENDERNESS IN MOUTH AND THROAT WITH OR WITHOUT PRESENCE OF ULCERS  *URINARY PROBLEMS  *BOWEL PROBLEMS  UNUSUAL RASH Items with * indicate a potential emergency and should be followed up as soon as possible.  Feel free to call the clinic should you have any questions or concerns. The clinic phone number is [phone].  Please show the CHEMO ALERT CARD at check-in to the Emergency Department and triage nurse.   

## 2021-03-11 NOTE — Progress Notes (Signed)
[facility] OFFICE PROGRESS NOTE   Diagnosis: Non-small cell lung cancer  INTERVAL HISTORY:   Barbara Watkins returns as scheduled.  She feels well.  Good appetite.  Chronic intermittent back pain.  She complete another treatment with pembrolizumab on 01/28/2021.  No rash or diarrhea.  No new complaint.  Objective:  Vital signs in last 24 hours:  Blood pressure (!) 161/87, pulse 100, temperature 98.1 F (36.7 C), temperature source Tympanic, resp. rate 20, height _0  (1.626 m), weight 132 lb 1.6 oz (59.9 kg), SpO2 100 %.    Lymphatics: No cervical, supraclavicular, or axillary nodes Resp: Lungs clear bilaterally Cardio: Regular rate and rhythm GI: No hepatosplenomegaly Vascular: No leg edema  Skin: No rash   Lab Results:  Lab Results  Component Value Date   WBC 6.8 03/11/2021   HGB 13.6 03/11/2021   HCT 40.5 03/11/2021   MCV 89.2 03/11/2021   PLT 211 03/11/2021   NEUTROABS 3.2 03/11/2021    CMP  Lab Results  Component Value Date   NA 139 01/28/2021   K 4.3 01/28/2021   CL 108 01/28/2021   CO2 24 01/28/2021   GLUCOSE 88 01/28/2021   BUN 17 01/28/2021   CREATININE 1.24 (H) 01/28/2021   CALCIUM 10.5 (H) 01/28/2021   CALCIUM 10.3 01/28/2021   PROT 7.4 01/28/2021   ALBUMIN 4.2 01/28/2021   AST 14 (L) 01/28/2021   ALT 9 01/28/2021   ALKPHOS 89 01/28/2021   BILITOT 0.5 01/28/2021   GFRNONAA 50 (L) 01/28/2021   GFRAA 52 (L) 09/24/2020     Medications: I have reviewed the patient's current medications.   Assessment/Plan: 1. Metastatic non-small cell lung cancer   CTabdomen/pelvis 06/12/2019-scattered soft tissue masses in left hepatic lobe, right kidney, and the right adrenal gland. Fullness of the left hilum suspicious for left hilar mass and small nodules at the right lung base. Also noted signs of right pyelonephritis.  CT chest 06/13/2019-large encasing soft tissue mass left hilum measuring at least 5.8 x 4.5 x 3.8 cm with narrowing of  the left mainstem bronchus and left pulmonary artery. Near-total obstructive atelectasis of the left upper lobe with a portion of the lingula remaining aerated. Multiple bilateral pulmonary nodules.  Biopsy left supraclavicular lymph node 06/15/2019-metastatic lung adenocarcinoma; immunohistochemistry positive for cytokeratin 7, TTF-1, PAX 8 (weak). Napsin-A and CDX-2negative.Foundation 1-microsatellite stable, tumor mutational burden 15, ATM alteration, KRAS G13D;tumor proportion score 100%  Brain CT 06/29/2019-negative for metastatic disease  Cycle 1 carboplatin/Alimta/pembrolizumab 06/29/2019  Cycle 2 carboplatin/Alimta/pembrolizumab 07/20/2019  Cycle 3 carboplatin/Alimta 08/10/2019, pembrolizumab held due to rash  Cycle 4 carboplatin/Alimta, Udenyca added, pembrolizumab held9/01/2019  Cycle 5 Alimta alone (carboplatin discontinued, pembrolizumab held)09/22/2019  CTs 10/06/2019-decrease in size of central left upper lobe pulmonary mass; bilateral pulmonary nodules of varying size some of which are irregular in shape; many of these are stable in the interval. Some of the right lung nodules have decreased/resolved since the prior study. Some new irregular nodules in the left lower lobe indeterminate but potentially infectious/inflammatory. Marked interval decrease in size of left hepatic and right renal metastases. Metastatic retroperitoneal lymphadenopathy in the abdomen has clearly decreased. Metastatic nodules in the right omentum and perirenal fat bilaterally have decreased/resolved. No new or progressive findings.  Cycle 6 Alimta/pembrolizumab 10/17/2019  Cycle 7 Alimta/pembrolizumab 11/08/2019  Cycle 8 Alimta/pembrolizumab   Cycle 9 Pembrolizumab 12/19/2019 (Alimta held due to elevated creatinine)  Cycle 10 Pembrolizumab 01/10/2020 (Alimta held due to borderline creatinine)  CTs 01/26/2020-decreased left hilar soft tissue,  persistent mass effect on the left upper lobe  pulmonary artery, medial left upper lobe pulmonary lesion has decreased in size with further reexpansion of the left upper lobe, 5 mm right upper lobe nodule more conspicuous, new spiculated nodular densities in the left lower lobe on previous exam have almost completely resolved, decreased left hepatic lesion, decreased right renal lesion, resolved right adrenal lesion decreased retroperitoneal lymphadenopathy  Cycle 11 Alimta/pembrolizumab 01/31/2020  Cycle 12 Alimta/pembrolizumab 02/21/2020  Cycle 13 Alimta/pembrolizumab 03/13/2020, Aloxi added for delayed nausea  Cycle 14 Alimta/pembrolizumab 04/02/2020, Aloxi plus Emend  Cycle 15 pembrolizumab alone 04/27/2020 (Alimta held due to elevated creatinine)  Cycle 16 pembrolizumab alone 05/18/2020  CTs 06/05/2020-stable exam. No significant change in size of medial left upper lobe lung mass. Unchanged appearance of left hepatic lobe metastasis. Slight decrease in size of right kidney lesion. Small pulmonary nodules stable to minimally decreased. No new or progressive disease.  Cycle 17 pembrolizumab 06/06/2020 (6-week dosing)  Cycle 18 Pembrolizumab 08/13/2020 (6-week dosing)  Cycle 19 pembrolizumab 09/24/2020 (6-week dosing)  Cycle 20 Pembrolizumab 11/05/2020 (6-week dosing)  CTs 12/13/2020-slight decrease in left hepatic lobe metastases, stable appearance of left upper lung lesion, resolution of tiny peripheral right upper lobe nodule, scattered stable pulmonary nodules, new 6 mm left apical nodule  Cycle 21 pembrolizumab 12/17/2020 (6-week dosing)  Cycle 22 Pembrolizumab 01/28/2021  Cycle 23 pembrolizumab 03/11/2021 2. Anemia 3.Urinary tract infection 4. 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 Nausea 6. Constipation 7.Right abdominal pain secondary to #1 8.Skin rashbilateral forearms, grade 2, likely related to pembrolizumab7/22/2020; grade 3 08/03/2019, prednisone initiated; rash improved 08/10/2019, prednisone decreased  to 10 mg daily x7 days then discontinued 9. Nodular right lip lesion 08/13/2020, referred to dermatology 10.  Hypercalcemia-mild chronic elevation, normal PTH level 06/05/2020    Disposition: [name] appears stable.  She will complete another treatment with pembrolizumab today.  She will return for an office visit and pembrolizumab in 6 weeks.  She will be scheduled for restaging CTs after the next office visit.  I refilled her prescription for tramadol.  Betsy Coder, MD  03/11/2021  12:29 PM

## 2021-03-12 ENCOUNTER — Telehealth: Payer: Self-pay | Admitting: Oncology

## 2021-03-12 NOTE — Telephone Encounter (Signed)
SCHEDULED APPT PER 3/14 LOS - UNABLE TO REACH PT.LEFT MESSAGE FOR PATIENT WITH APPT DATE AND TIME

## 2021-03-25 ENCOUNTER — Telehealth: Payer: Self-pay | Admitting: Oncology

## 2021-03-25 NOTE — Telephone Encounter (Signed)
I mailed updated appointment calendar to patient w/ change of location  

## 2021-04-20 ENCOUNTER — Other Ambulatory Visit: Payer: Self-pay | Admitting: Oncology

## 2021-04-23 ENCOUNTER — Inpatient Hospital Stay: Payer: Medicaid Other | Attending: Nurse Practitioner

## 2021-04-23 ENCOUNTER — Encounter: Payer: Self-pay | Admitting: Nurse Practitioner

## 2021-04-23 ENCOUNTER — Ambulatory Visit: Payer: Medicaid Other

## 2021-04-23 ENCOUNTER — Ambulatory Visit: Payer: Medicaid Other | Admitting: Oncology

## 2021-04-23 ENCOUNTER — Other Ambulatory Visit: Payer: Medicaid Other

## 2021-04-23 ENCOUNTER — Inpatient Hospital Stay (HOSPITAL_BASED_OUTPATIENT_CLINIC_OR_DEPARTMENT_OTHER): Payer: Medicaid Other | Admitting: Nurse Practitioner

## 2021-04-23 ENCOUNTER — Other Ambulatory Visit: Payer: Self-pay

## 2021-04-23 ENCOUNTER — Inpatient Hospital Stay: Payer: Medicaid Other

## 2021-04-23 VITALS — BP 155/80 | HR 96 | Temp 97.7°F | Resp 18 | Ht 64.0 in | Wt 131.0 lb

## 2021-04-23 DIAGNOSIS — C7971 Secondary malignant neoplasm of right adrenal gland: Secondary | ICD-10-CM | POA: Diagnosis not present

## 2021-04-23 DIAGNOSIS — Z5112 Encounter for antineoplastic immunotherapy: Secondary | ICD-10-CM | POA: Insufficient documentation

## 2021-04-23 DIAGNOSIS — C7902 Secondary malignant neoplasm of left kidney and renal pelvis: Secondary | ICD-10-CM | POA: Insufficient documentation

## 2021-04-23 DIAGNOSIS — C787 Secondary malignant neoplasm of liver and intrahepatic bile duct: Secondary | ICD-10-CM | POA: Diagnosis not present

## 2021-04-23 DIAGNOSIS — C3492 Malignant neoplasm of unspecified part of left bronchus or lung: Secondary | ICD-10-CM

## 2021-04-23 DIAGNOSIS — C3412 Malignant neoplasm of upper lobe, left bronchus or lung: Secondary | ICD-10-CM | POA: Insufficient documentation

## 2021-04-23 DIAGNOSIS — D649 Anemia, unspecified: Secondary | ICD-10-CM | POA: Insufficient documentation

## 2021-04-23 LAB — CMP (CANCER CENTER ONLY)
ALT: 6 U/L (ref 0–44)
AST: 12 U/L — ABNORMAL LOW (ref 15–41)
Albumin: 4.3 g/dL (ref 3.5–5.0)
Alkaline Phosphatase: 90 U/L (ref 38–126)
Anion gap: 10 (ref 5–15)
BUN: 14 mg/dL (ref 8–23)
CO2: 24 mmol/L (ref 22–32)
Calcium: 10.6 mg/dL — ABNORMAL HIGH (ref 8.9–10.3)
Chloride: 107 mmol/L (ref 98–111)
Creatinine: 1.12 mg/dL — ABNORMAL HIGH (ref 0.44–1.00)
GFR, Estimated: 56 mL/min — ABNORMAL LOW (ref >60)
Glucose, Bld: 96 mg/dL (ref 70–99)
Potassium: 4 mmol/L (ref 3.5–5.1)
Sodium: 141 mmol/L (ref 135–145)
Total Bilirubin: 0.4 mg/dL (ref 0.3–1.2)
Total Protein: 7 g/dL (ref 6.5–8.1)

## 2021-04-23 MED ORDER — SODIUM CHLORIDE 0.9 % IV SOLN
400.0000 mg | Freq: Once | INTRAVENOUS | Status: AC
  Administered 2021-04-23: 400 mg via INTRAVENOUS
  Filled 2021-04-23: qty 16

## 2021-04-23 MED ORDER — SODIUM CHLORIDE 0.9 % IV SOLN
Freq: Once | INTRAVENOUS | Status: AC
  Filled 2021-04-23: qty 250

## 2021-04-23 NOTE — Patient Instructions (Signed)
Barbara Watkins   Discharge Instructions:  Thank you for choosing [facility] to provide your oncology and hematology care.   If you have a lab appointment with the [facility], please go directly to the [facility] and check in at the registration area.   Wear comfortable clothing and clothing appropriate for easy access to any Portacath or PICC line.   We strive to give you quality time with your provider. You may need to reschedule your appointment if you arrive late (15 or more minutes).  Arriving late affects you and other patients whose appointments are after yours.  Also, if you miss three or more appointments without notifying the office, you may be dismissed from the clinic at the provider's discretion.      For prescription refill requests, have your pharmacy contact our office and allow 72 hours for refills to be completed.    Today you received the following chemotherapy and/or immunotherapy agents Pembrolizumab (KEYTRUDA).   To help prevent nausea and vomiting after your treatment, we encourage you to take your nausea medication as directed.  BELOW ARE SYMPTOMS THAT SHOULD BE REPORTED IMMEDIATELY: . *FEVER GREATER THAN 100.4 F (38 C) OR HIGHER . *CHILLS OR SWEATING . *NAUSEA AND VOMITING THAT IS NOT CONTROLLED WITH YOUR NAUSEA MEDICATION . *UNUSUAL SHORTNESS OF BREATH . *UNUSUAL BRUISING OR BLEEDING . *URINARY PROBLEMS (pain or burning when urinating, or frequent urination) . *BOWEL PROBLEMS (unusual diarrhea, constipation, pain near the anus) . TENDERNESS IN MOUTH AND THROAT WITH OR WITHOUT PRESENCE OF ULCERS (sore throat, sores in mouth, or a toothache) . UNUSUAL RASH, SWELLING OR PAIN  . UNUSUAL VAGINAL DISCHARGE OR ITCHING   Items with * indicate a potential emergency and should be followed up as soon as possible or go to the Emergency Department if any problems should occur.  Please show the CHEMOTHERAPY ALERT CARD or  IMMUNOTHERAPY ALERT CARD at check-in to the Emergency Department and triage nurse.  Should you have questions after your visit or need to cancel or reschedule your appointment, please contact [facility]  Dept: [phone]  and follow the prompts.  Office hours are 8:00 a.m. to 4:30 p.m. Monday - Friday. Please note that voicemails left after 4:00 p.m. may not be returned until the following business day.  We are closed weekends and major holidays. You have access to a nurse at all times for urgent questions. Please call the main number to the clinic Dept: [phone] and follow the prompts.   For any non-urgent questions, you may also contact your provider using MyChart. We now offer e-Visits for anyone 15 and older to request care online for non-urgent symptoms. For details visit [email or web address].   Also download the MyChart app! Go to the app store, search "MyChart", open the app, select [facility], and log in with your MyChart username and password.  Due to Covid, a mask is required upon entering the hospital/clinic. If you do not have a mask, one will be given to you upon arrival. For doctor visits, patients may have 1 support person aged 92 or older with them. For treatment visits, patients cannot have anyone with them due to current Covid guidelines and our immunocompromised population.

## 2021-04-23 NOTE — Progress Notes (Signed)
[facility] OFFICE PROGRESS NOTE   Diagnosis: Non-small cell lung cancer  INTERVAL HISTORY:   Barbara Watkins returns as scheduled.  She completed another cycle of Pembrolizumab 03/11/2021.  No rash or diarrhea.  No nausea or vomiting.  She denies shortness of breath.  No change in chronic back pain.  She continues to have a good appetite.  Objective:  Vital signs in last 24 hours:  Blood pressure (!) 155/80, pulse 96, temperature 97.7 F (36.5 C), temperature source Oral, resp. rate 18, height 5' 4"  (1.626 m), weight 131 lb (59.4 kg), SpO2 100 %.    HEENT: No thrush or ulcers. Lymphatics: No palpable cervical or supraclavicular lymph nodes. Resp: Distant breath sounds.  No respiratory distress. Cardio: Regular rate and rhythm. GI: Abdomen soft and nontender.  No hepatomegaly. Vascular: No leg edema. Skin: No rash.   Lab Results:  Lab Results  Component Value Date   WBC 6.8 03/11/2021   HGB 13.6 03/11/2021   HCT 40.5 03/11/2021   MCV 89.2 03/11/2021   PLT 211 03/11/2021   NEUTROABS 3.2 03/11/2021    Imaging:  No results found.  Medications: I have reviewed the patient's current medications.  Assessment/Plan: 1. Metastatic non-small cell lung cancer   CTabdomen/pelvis 06/12/2019-scattered soft tissue masses in left hepatic lobe, right kidney, and the right adrenal gland. Fullness of the left hilum suspicious for left hilar mass and small nodules at the right lung base. Also noted signs of right pyelonephritis.  CT chest 06/13/2019-large encasing soft tissue mass left hilum measuring at least 5.8 x 4.5 x 3.8 cm with narrowing of the left mainstem bronchus and left pulmonary artery. Near-total obstructive atelectasis of the left upper lobe with a portion of the lingula remaining aerated. Multiple bilateral pulmonary nodules.  Biopsy left supraclavicular lymph node 06/15/2019-metastatic lung adenocarcinoma; immunohistochemistry positive for cytokeratin  7, TTF-1, PAX 8 (weak). Napsin-A and CDX-2negative.Foundation 1-microsatellite stable, tumor mutational burden 15, ATM alteration, KRAS G13D;tumor proportion score 100%  Brain CT 06/29/2019-negative for metastatic disease  Cycle 1 carboplatin/Alimta/pembrolizumab 06/29/2019  Cycle 2 carboplatin/Alimta/pembrolizumab 07/20/2019  Cycle 3 carboplatin/Alimta 08/10/2019, pembrolizumab held due to rash  Cycle 4 carboplatin/Alimta, Udenyca added, pembrolizumab held9/01/2019  Cycle 5 Alimta alone (carboplatin discontinued, pembrolizumab held)09/22/2019  CTs 10/06/2019-decrease in size of central left upper lobe pulmonary mass; bilateral pulmonary nodules of varying size some of which are irregular in shape; many of these are stable in the interval. Some of the right lung nodules have decreased/resolved since the prior study. Some new irregular nodules in the left lower lobe indeterminate but potentially infectious/inflammatory. Marked interval decrease in size of left hepatic and right renal metastases. Metastatic retroperitoneal lymphadenopathy in the abdomen has clearly decreased. Metastatic nodules in the right omentum and perirenal fat bilaterally have decreased/resolved. No new or progressive findings.  Cycle 6 Alimta/pembrolizumab 10/17/2019  Cycle 7 Alimta/pembrolizumab 11/08/2019  Cycle 8 Alimta/pembrolizumab   Cycle 9 Pembrolizumab 12/19/2019 (Alimta held due to elevated creatinine)  Cycle 10 Pembrolizumab 01/10/2020 (Alimta held due to borderline creatinine)  CTs 01/26/2020-decreased left hilar soft tissue, persistent mass effect on the left upper lobe pulmonary artery, medial left upper lobe pulmonary lesion has decreased in size with further reexpansion of the left upper lobe, 5 mm right upper lobe nodule more conspicuous, new spiculated nodular densities in the left lower lobe on previous exam have almost completely resolved, decreased left hepatic lesion, decreased right renal  lesion, resolved right adrenal lesion decreased retroperitoneal lymphadenopathy  Cycle 11 Alimta/pembrolizumab 01/31/2020  Cycle 12  Alimta/pembrolizumab 02/21/2020  Cycle 13 Alimta/pembrolizumab 03/13/2020, Aloxi added for delayed nausea  Cycle 14 Alimta/pembrolizumab 04/02/2020, Aloxi plus Emend  Cycle 15 pembrolizumab alone 04/27/2020 (Alimta held due to elevated creatinine)  Cycle 16 pembrolizumab alone 05/18/2020  CTs 06/05/2020-stable exam. No significant change in size of medial left upper lobe lung mass. Unchanged appearance of left hepatic lobe metastasis. Slight decrease in size of right kidney lesion. Small pulmonary nodules stable to minimally decreased. No new or progressive disease.  Cycle 17 pembrolizumab 06/06/2020 (6-week dosing)  Cycle 18 Pembrolizumab 08/13/2020 (6-week dosing)  Cycle 19 pembrolizumab 09/24/2020 (6-week dosing)  Cycle 20 Pembrolizumab 11/05/2020 (6-week dosing)  CTs 12/13/2020-slight decrease in left hepatic lobe metastases, stable appearance of left upper lung lesion, resolution of tiny peripheral right upper lobe nodule, scattered stable pulmonary nodules, new 6 mm left apical nodule  Cycle 21 pembrolizumab 12/17/2020 (6-week dosing)  Cycle 22 Pembrolizumab 01/28/2021  Cycle 23 pembrolizumab 03/11/2021  Cycle 24 Pembrolizumab 04/23/2021 2. Anemia 3.Urinary tract infection 4. 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 Nausea 6. Constipation 7.Right abdominal pain secondary to #1 8.Skin rashbilateral forearms, grade 2, likely related to pembrolizumab7/22/2020; grade 3 08/03/2019, prednisone initiated; rash improved 08/10/2019, prednisone decreased to 10 mg daily x7 days then discontinued 9. Nodular right lip lesion 08/13/2020, referred to dermatology 10.Hypercalcemia-mild chronic elevation, normal PTH level 06/05/2020   Disposition: Barbara Watkins appears well.  She has completed 23 cycles of Pembrolizumab.  There is no clinical evidence  of disease progression.  Plan to proceed with cycle 24 today as scheduled.  Restaging CTs prior to next visit.  Chemistry panel reviewed.  Labs adequate to proceed as above.  She will return for lab, follow-up, Pembrolizumab in 6 weeks with CT scans a few days prior.    Ned Card ANP/GNP-BC   04/23/2021  11:47 AM

## 2021-05-22 ENCOUNTER — Encounter: Payer: Self-pay | Admitting: Oncology

## 2021-05-28 ENCOUNTER — Other Ambulatory Visit: Payer: Self-pay | Admitting: Oncology

## 2021-05-29 ENCOUNTER — Encounter: Payer: Self-pay | Admitting: Oncology

## 2021-05-30 ENCOUNTER — Encounter: Payer: Self-pay | Admitting: Oncology

## 2021-05-31 ENCOUNTER — Inpatient Hospital Stay: Payer: Medicaid Other | Attending: Nurse Practitioner

## 2021-05-31 ENCOUNTER — Other Ambulatory Visit: Payer: Self-pay

## 2021-05-31 ENCOUNTER — Encounter (HOSPITAL_BASED_OUTPATIENT_CLINIC_OR_DEPARTMENT_OTHER): Payer: Self-pay

## 2021-05-31 ENCOUNTER — Ambulatory Visit (HOSPITAL_BASED_OUTPATIENT_CLINIC_OR_DEPARTMENT_OTHER)
Admission: RE | Admit: 2021-05-31 | Discharge: 2021-05-31 | Disposition: A | Discharge status: Home / Self Care | Payer: Medicaid Other | Source: Ambulatory Visit | Attending: Nurse Practitioner | Admitting: Nurse Practitioner

## 2021-05-31 DIAGNOSIS — K5900 Constipation, unspecified: Secondary | ICD-10-CM | POA: Insufficient documentation

## 2021-05-31 DIAGNOSIS — Z79899 Other long term (current) drug therapy: Secondary | ICD-10-CM | POA: Insufficient documentation

## 2021-05-31 DIAGNOSIS — J9811 Atelectasis: Secondary | ICD-10-CM | POA: Insufficient documentation

## 2021-05-31 DIAGNOSIS — C3492 Malignant neoplasm of unspecified part of left bronchus or lung: Secondary | ICD-10-CM

## 2021-05-31 DIAGNOSIS — R918 Other nonspecific abnormal finding of lung field: Secondary | ICD-10-CM | POA: Diagnosis not present

## 2021-05-31 DIAGNOSIS — C787 Secondary malignant neoplasm of liver and intrahepatic bile duct: Secondary | ICD-10-CM | POA: Insufficient documentation

## 2021-05-31 DIAGNOSIS — I700 Atherosclerosis of aorta: Secondary | ICD-10-CM | POA: Insufficient documentation

## 2021-05-31 DIAGNOSIS — K769 Liver disease, unspecified: Secondary | ICD-10-CM | POA: Diagnosis not present

## 2021-05-31 DIAGNOSIS — D649 Anemia, unspecified: Secondary | ICD-10-CM | POA: Insufficient documentation

## 2021-05-31 DIAGNOSIS — C3412 Malignant neoplasm of upper lobe, left bronchus or lung: Secondary | ICD-10-CM | POA: Insufficient documentation

## 2021-05-31 DIAGNOSIS — Z5112 Encounter for antineoplastic immunotherapy: Secondary | ICD-10-CM | POA: Insufficient documentation

## 2021-05-31 DIAGNOSIS — N390 Urinary tract infection, site not specified: Secondary | ICD-10-CM | POA: Insufficient documentation

## 2021-05-31 LAB — CMP (CANCER CENTER ONLY)
ALT: 7 U/L (ref 0–44)
AST: 14 U/L — ABNORMAL LOW (ref 15–41)
Albumin: 4.4 g/dL (ref 3.5–5.0)
Alkaline Phosphatase: 88 U/L (ref 38–126)
Anion gap: 9 (ref 5–15)
BUN: 18 mg/dL (ref 8–23)
CO2: 25 mmol/L (ref 22–32)
Calcium: 10.3 mg/dL (ref 8.9–10.3)
Chloride: 104 mmol/L (ref 98–111)
Creatinine: 1.23 mg/dL — ABNORMAL HIGH (ref 0.44–1.00)
GFR, Estimated: 50 mL/min — ABNORMAL LOW (ref >60)
Glucose, Bld: 95 mg/dL (ref 70–99)
Potassium: 4.2 mmol/L (ref 3.5–5.1)
Sodium: 138 mmol/L (ref 135–145)
Total Bilirubin: 0.6 mg/dL (ref 0.3–1.2)
Total Protein: 7.3 g/dL (ref 6.5–8.1)

## 2021-05-31 LAB — CBC WITH DIFFERENTIAL (CANCER CENTER ONLY)
Abs Immature Granulocytes: 0.01 10*3/uL (ref 0.00–0.07)
Basophils Absolute: 0.1 10*3/uL (ref 0.0–0.1)
Basophils Relative: 2 %
Eosinophils Absolute: 0.4 10*3/uL (ref 0.0–0.5)
Eosinophils Relative: 6 %
HCT: 44.9 % (ref 36.0–46.0)
Hemoglobin: 14.5 g/dL (ref 12.0–15.0)
Immature Granulocytes: 0 %
Lymphocytes Relative: 41 %
Lymphs Abs: 2.5 10*3/uL (ref 0.7–4.0)
MCH: 29.5 pg (ref 26.0–34.0)
MCHC: 32.3 g/dL (ref 30.0–36.0)
MCV: 91.3 fL (ref 80.0–100.0)
Monocytes Absolute: 0.4 10*3/uL (ref 0.1–1.0)
Monocytes Relative: 6 %
Neutro Abs: 2.7 10*3/uL (ref 1.7–7.7)
Neutrophils Relative %: 45 %
Platelet Count: 201 10*3/uL (ref 150–400)
RBC: 4.92 MIL/uL (ref 3.87–5.11)
RDW: 13.2 % (ref 11.5–15.5)
WBC Count: 6.1 10*3/uL (ref 4.0–10.5)
nRBC: 0 % (ref 0.0–0.2)

## 2021-05-31 LAB — TSH: TSH: 2.946 u[IU]/mL (ref 0.350–4.500)

## 2021-05-31 MED ORDER — IOHEXOL 300 MG/ML  SOLN
75.0000 mL | Freq: Once | INTRAMUSCULAR | Status: AC | PRN
  Administered 2021-05-31: 75 mL via INTRAVENOUS

## 2021-06-04 ENCOUNTER — Inpatient Hospital Stay: Payer: Medicaid Other

## 2021-06-04 ENCOUNTER — Inpatient Hospital Stay (HOSPITAL_BASED_OUTPATIENT_CLINIC_OR_DEPARTMENT_OTHER): Payer: Medicaid Other | Admitting: Nurse Practitioner

## 2021-06-04 ENCOUNTER — Encounter: Payer: Self-pay | Admitting: Nurse Practitioner

## 2021-06-04 ENCOUNTER — Other Ambulatory Visit (HOSPITAL_BASED_OUTPATIENT_CLINIC_OR_DEPARTMENT_OTHER): Payer: Self-pay

## 2021-06-04 ENCOUNTER — Other Ambulatory Visit: Payer: Self-pay

## 2021-06-04 ENCOUNTER — Encounter: Payer: Self-pay | Admitting: Oncology

## 2021-06-04 DIAGNOSIS — C3492 Malignant neoplasm of unspecified part of left bronchus or lung: Secondary | ICD-10-CM

## 2021-06-04 DIAGNOSIS — Z5112 Encounter for antineoplastic immunotherapy: Secondary | ICD-10-CM | POA: Diagnosis not present

## 2021-06-04 DIAGNOSIS — N390 Urinary tract infection, site not specified: Secondary | ICD-10-CM | POA: Diagnosis not present

## 2021-06-04 DIAGNOSIS — C787 Secondary malignant neoplasm of liver and intrahepatic bile duct: Secondary | ICD-10-CM | POA: Diagnosis not present

## 2021-06-04 DIAGNOSIS — Z79899 Other long term (current) drug therapy: Secondary | ICD-10-CM | POA: Diagnosis not present

## 2021-06-04 DIAGNOSIS — D649 Anemia, unspecified: Secondary | ICD-10-CM | POA: Diagnosis not present

## 2021-06-04 DIAGNOSIS — C3412 Malignant neoplasm of upper lobe, left bronchus or lung: Secondary | ICD-10-CM | POA: Diagnosis present

## 2021-06-04 DIAGNOSIS — K5900 Constipation, unspecified: Secondary | ICD-10-CM | POA: Diagnosis not present

## 2021-06-04 DIAGNOSIS — J9811 Atelectasis: Secondary | ICD-10-CM | POA: Diagnosis not present

## 2021-06-04 MED ORDER — TRAMADOL HCL 50 MG PO TABS
ORAL_TABLET | ORAL | 1 refills | Status: DC
  Filled 2021-06-04: qty 30, 30d supply, fill #0

## 2021-06-04 MED ORDER — SODIUM CHLORIDE 0.9 % IV SOLN
Freq: Once | INTRAVENOUS | Status: AC
Start: 2021-06-04 — End: 2021-06-04
  Filled 2021-06-04: qty 250

## 2021-06-04 MED ORDER — SODIUM CHLORIDE 0.9 % IV SOLN
400.0000 mg | Freq: Once | INTRAVENOUS | Status: AC
  Administered 2021-06-04: 400 mg via INTRAVENOUS
  Filled 2021-06-04: qty 16

## 2021-06-04 NOTE — Patient Instructions (Signed)
[facility]    Discharge Instructions:  Thank you for choosing [facility] to provide your oncology and hematology care.   If you have a lab appointment with the [facility], please go directly to the [facility] and check in at the registration area.   Wear comfortable clothing and clothing appropriate for easy access to any Portacath or PICC line.   We strive to give you quality time with your provider. You may need to reschedule your appointment if you arrive late (15 or more minutes).  Arriving late affects you and other patients whose appointments are after yours.  Also, if you miss three or more appointments without notifying the office, you may be dismissed from the clinic at the provider's discretion.      For prescription refill requests, have your pharmacy contact our office and allow 72 hours for refills to be completed.    Today you received the following chemotherapy and/or immunotherapy agents Pembrolizumab (KEYTRUDA).   To help prevent nausea and vomiting after your treatment, we encourage you to take your nausea medication as directed.  BELOW ARE SYMPTOMS THAT SHOULD BE REPORTED IMMEDIATELY: . *FEVER GREATER THAN 100.4 F (38 C) OR HIGHER . *CHILLS OR SWEATING . *NAUSEA AND VOMITING THAT IS NOT CONTROLLED WITH YOUR NAUSEA MEDICATION . *UNUSUAL SHORTNESS OF BREATH . *UNUSUAL BRUISING OR BLEEDING . *URINARY PROBLEMS (pain or burning when urinating, or frequent urination) . *BOWEL PROBLEMS (unusual diarrhea, constipation, pain near the anus) . TENDERNESS IN MOUTH AND THROAT WITH OR WITHOUT PRESENCE OF ULCERS (sore throat, sores in mouth, or a toothache) . UNUSUAL RASH, SWELLING OR PAIN  . UNUSUAL VAGINAL DISCHARGE OR ITCHING   Items with * indicate a potential emergency and should be followed up as soon as possible or go to the Emergency Department if any problems should occur.  Please show the CHEMOTHERAPY ALERT CARD or  IMMUNOTHERAPY ALERT CARD at check-in to the Emergency Department and triage nurse.  Should you have questions after your visit or need to cancel or reschedule your appointment, please contact [facility]  Dept: [phone]  and follow the prompts.  Office hours are 8:00 a.m. to 4:30 p.m. Monday - Friday. Please note that voicemails left after 4:00 p.m. may not be returned until the following business day.  We are closed weekends and major holidays. You have access to a nurse at all times for urgent questions. Please call the main number to the clinic Dept: [phone] and follow the prompts.   For any non-urgent questions, you may also contact your provider using MyChart. We now offer e-Visits for anyone 71 and older to request care online for non-urgent symptoms. For details visit [email or web address].   Also download the MyChart app! Go to the app store, search "MyChart", open the app, select [facility], and log in with your MyChart username and password.  Due to Covid, a mask is required upon entering the hospital/clinic. If you do not have a mask, one will be given to you upon arrival. For doctor visits, patients may have 1 support person aged 65 or older with them. For treatment visits, patients cannot have anyone with them due to current Covid guidelines and our immunocompromised population.   Pembrolizumab injection What is this medicine? PEMBROLIZUMAB (pem broe liz ue mab) is a monoclonal antibody. It is used to treat certain types of cancer. This medicine may be used for other purposes; ask your health care provider  or pharmacist if you have questions. COMMON BRAND NAME(S): Keytruda What should I tell my health care provider before I take this medicine? They need to know if you have any of these conditions:  autoimmune diseases like Crohn's disease, ulcerative colitis, or lupus  have had or planning to have an allogeneic stem cell transplant (uses  someone else's stem cells)  history of organ transplant  history of chest radiation  nervous system problems like myasthenia gravis or Guillain-Barre syndrome  an unusual or allergic reaction to pembrolizumab, other medicines, foods, dyes, or preservatives  pregnant or trying to get pregnant  breast-feeding How should I use this medicine? This medicine is for infusion into a vein. It is given by a health care professional in a hospital or clinic setting. A special MedGuide will be given to you before each treatment. Be sure to read this information carefully each time. Talk to your pediatrician regarding the use of this medicine in children. While this drug may be prescribed for children as young as 6 months for selected conditions, precautions do apply. Overdosage: If you think you have taken too much of this medicine contact a poison control center or emergency room at once. NOTE: This medicine is only for you. Do not share this medicine with others. What if I miss a dose? It is important not to miss your dose. Call your doctor or health care professional if you are unable to keep an appointment. What may interact with this medicine? Interactions have not been studied. This list may not describe all possible interactions. Give your health care provider a list of all the medicines, herbs, non-prescription drugs, or dietary supplements you use. Also tell them if you smoke, drink alcohol, or use illegal drugs. Some items may interact with your medicine. What should I watch for while using this medicine? Your condition will be monitored carefully while you are receiving this medicine. You may need blood work done while you are taking this medicine. Do not become pregnant while taking this medicine or for 4 months after stopping it. Women should inform their doctor if they wish to become pregnant or think they might be pregnant. There is a potential for serious side effects to an unborn  child. Talk to your health care professional or pharmacist for more information. Do not breast-feed an infant while taking this medicine or for 4 months after the last dose. What side effects may I notice from receiving this medicine? Side effects that you should report to your doctor or health care professional as soon as possible:  allergic reactions like skin rash, itching or hives, swelling of the face, lips, or tongue  bloody or black, tarry  breathing problems  changes in vision  chest pain  chills  confusion  constipation  cough  diarrhea  dizziness or feeling faint or lightheaded  fast or irregular heartbeat  fever  flushing  joint pain  low blood counts - this medicine may decrease the number of white blood cells, red blood cells and platelets. You may be at increased risk for infections and bleeding.  muscle pain  muscle weakness  pain, tingling, numbness in the hands or feet  persistent headache  redness, blistering, peeling or loosening of the skin, including inside the mouth  signs and symptoms of high blood sugar such as dizziness; dry mouth; dry skin; fruity breath; nausea; stomach pain; increased hunger or thirst; increased urination  signs and symptoms of kidney injury like trouble passing urine or change in  the amount of urine  signs and symptoms of liver injury like dark urine, light-colored stools, loss of appetite, nausea, right upper belly pain, yellowing of the eyes or skin  sweating  swollen lymph nodes  weight loss Side effects that usually do not require medical attention (report to your doctor or health care professional if they continue or are bothersome):  decreased appetite  hair loss  tiredness This list may not describe all possible side effects. Call your doctor for medical advice about side effects. You may report side effects to FDA at 1-800-FDA-1088. Where should I keep my medicine? This drug is given in a hospital  or clinic and will not be stored at home. NOTE: This sheet is a summary. It may not cover all possible information. If you have questions about this medicine, talk to your doctor, pharmacist, or health care provider.  2021 Elsevier/Gold Standard (2019-11-16 21:44:53)

## 2021-06-04 NOTE — Progress Notes (Signed)
[facility] OFFICE PROGRESS NOTE   Diagnosis: Non-small cell lung cancer  INTERVAL HISTORY:   Barbara Watkins returns as scheduled.  She completed another cycle of Pembrolizumab 04/23/2021.  She feels well.  No nausea or vomiting.  No mouth sores.  No diarrhea.  No rash.  She denies shortness of breath.  She has occasional back pain and takes Ultram periodically.  Objective:  Vital signs in last 24 hours:  Blood pressure (!) 149/80, pulse 99, temperature 97.7 F (36.5 C), temperature source Oral, resp. rate 19, height _0  (1.626 m), weight 131 lb 3.2 oz (59.5 kg), SpO2 100 %.    HEENT: No thrush or ulcers. Lymphatics: No palpable cervical or supraclavicular lymph nodes. Resp: Lungs clear bilaterally. Cardio: Regular rate and rhythm. GI: Abdomen soft and nontender.  No hepatomegaly. Vascular: No leg edema. Neuro: Alert and oriented. Skin: No rash.   Lab Results:  Lab Results  Component Value Date   WBC 6.1 05/31/2021   HGB 14.5 05/31/2021   HCT 44.9 05/31/2021   MCV 91.3 05/31/2021   PLT 201 05/31/2021   NEUTROABS 2.7 05/31/2021    Imaging:  No results found.  Medications: I have reviewed the patient's current medications.  Assessment/Plan: 1.Metastatic non-small cell lung cancer   CTabdomen/pelvis 06/12/2019-scattered soft tissue masses in left hepatic lobe, right kidney, and the right adrenal gland. Fullness of the left hilum suspicious for left hilar mass and small nodules at the right lung base. Also noted signs of right pyelonephritis.  CT chest 06/13/2019-large encasing soft tissue mass left hilum measuring at least 5.8 x 4.5 x 3.8 cm with narrowing of the left mainstem bronchus and left pulmonary artery. Near-total obstructive atelectasis of the left upper lobe with a portion of the lingula remaining aerated. Multiple bilateral pulmonary nodules.  Biopsy left supraclavicular lymph node 06/15/2019-metastatic lung adenocarcinoma;  immunohistochemistry positive for cytokeratin 7, TTF-1, PAX 8 (weak). Napsin-A and CDX-2negative.Foundation 1-microsatellite stable, tumor mutational burden 15, ATM alteration, KRAS G13D;tumor proportion score 100%  Brain CT 06/29/2019-negative for metastatic disease  Cycle 1 carboplatin/Alimta/pembrolizumab 06/29/2019  Cycle 2 carboplatin/Alimta/pembrolizumab 07/20/2019  Cycle 3 carboplatin/Alimta 08/10/2019, pembrolizumab held due to rash  Cycle 4 carboplatin/Alimta, Udenyca added, pembrolizumab held9/01/2019  Cycle 5 Alimta alone (carboplatin discontinued, pembrolizumab held)09/22/2019  CTs 10/06/2019-decrease in size of central left upper lobe pulmonary mass; bilateral pulmonary nodules of varying size some of which are irregular in shape; many of these are stable in the interval. Some of the right lung nodules have decreased/resolved since the prior study. Some new irregular nodules in the left lower lobe indeterminate but potentially infectious/inflammatory. Marked interval decrease in size of left hepatic and right renal metastases. Metastatic retroperitoneal lymphadenopathy in the abdomen has clearly decreased. Metastatic nodules in the right omentum and perirenal fat bilaterally have decreased/resolved. No new or progressive findings.  Cycle 6 Alimta/pembrolizumab 10/17/2019  Cycle 7 Alimta/pembrolizumab 11/08/2019  Cycle 8 Alimta/pembrolizumab   Cycle 9 Pembrolizumab 12/19/2019 (Alimta held due to elevated creatinine)  Cycle 10 Pembrolizumab 01/10/2020 (Alimta held due to borderline creatinine)  CTs 01/26/2020-decreased left hilar soft tissue, persistent mass effect on the left upper lobe pulmonary artery, medial left upper lobe pulmonary lesion has decreased in size with further reexpansion of the left upper lobe, 5 mm right upper lobe nodule more conspicuous, new spiculated nodular densities in the left lower lobe on previous exam have almost completely resolved, decreased  left hepatic lesion, decreased right renal lesion, resolved right adrenal lesion decreased retroperitoneal lymphadenopathy  Cycle 11  Alimta/pembrolizumab 01/31/2020  Cycle 12 Alimta/pembrolizumab 02/21/2020  Cycle 13 Alimta/pembrolizumab 03/13/2020, Aloxi added for delayed nausea  Cycle 14 Alimta/pembrolizumab 04/02/2020, Aloxi plus Emend  Cycle 15 pembrolizumab alone 04/27/2020 (Alimta held due to elevated creatinine)  Cycle 16 pembrolizumab alone 05/18/2020  CTs 06/05/2020-stable exam. No significant change in size of medial left upper lobe lung mass. Unchanged appearance of left hepatic lobe metastasis. Slight decrease in size of right kidney lesion. Small pulmonary nodules stable to minimally decreased. No new or progressive disease.  Cycle 17 pembrolizumab 06/06/2020 (6-week dosing)  Cycle 18 Pembrolizumab 08/13/2020 (6-week dosing)  Cycle 19 pembrolizumab 09/24/2020 (6-week dosing)  Cycle 20 Pembrolizumab 11/05/2020 (6-week dosing)  CTs 12/13/2020-slight decrease in left hepatic lobe metastases, stable appearance of left upper lung lesion, resolution of tiny peripheral right upper lobe nodule, scattered stable pulmonary nodules, new 6 mm left apical nodule  Cycle 21 pembrolizumab 12/17/2020 (6-week dosing)  Cycle 22 Pembrolizumab 01/28/2021  Cycle 23 pembrolizumab 03/11/2021  Cycle 24 Pembrolizumab 04/23/2021  CTs 05/31/2021- stable posttreatment changes involving the left upper lobe medially with 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  No findings to suggest recurrent tumor.  Stable scattered pulmonary nodules and groundglass opacities in both lungs.  Stable 5.0 x 4.7 cm partially enhancing necrotic left hepatic lobe lesion.  No new hepatic metastatic disease.  Stable scarring changes involving the upper pole region of the right kidney with adjacent persistent masslike lesion measuring 19 mm.  Cycle 25 Pembrolizumab 06/04/2021 2. Anemia 3.Urinary tract infection 4. 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 Nausea 6. Constipation 7.Right abdominal pain secondary to #1 8.Skin rashbilateral forearms, grade 2, likely related to pembrolizumab7/22/2020; grade 3 08/03/2019, prednisone initiated; rash improved 08/10/2019, prednisone decreased to 10 mg daily x7 days then discontinued 9. Nodular right lip lesion 08/13/2020, referred to dermatology 10.Hypercalcemia-mild chronic elevation, normal PTH level 06/05/2020   Disposition: Ms. Brum appears well.  She has completed 24 cycles of Pembrolizumab.  Recent restaging CTs show stable disease.  Plan to proceed with cycle 25 today as scheduled.  We reviewed the CBC, chemistry panel and TSH from 05/31/2021.  Labs adequate to proceed as above.  She will return for lab, follow-up, Pembrolizumab in 6 weeks.    Ned Card ANP/GNP-BC   06/04/2021  9:52 AM

## 2021-07-11 ENCOUNTER — Encounter: Payer: Self-pay | Admitting: *Deleted

## 2021-07-11 NOTE — Progress Notes (Signed)
Per Dr. Benay Spice: Does not need labs on 7/19. Canceled and patient aware.

## 2021-07-13 ENCOUNTER — Other Ambulatory Visit: Payer: Self-pay | Admitting: Oncology

## 2021-07-16 ENCOUNTER — Inpatient Hospital Stay: Payer: Medicaid Other

## 2021-07-16 ENCOUNTER — Other Ambulatory Visit: Payer: Self-pay

## 2021-07-16 ENCOUNTER — Inpatient Hospital Stay: Payer: Medicaid Other | Attending: Nurse Practitioner | Admitting: Oncology

## 2021-07-16 VITALS — BP 140/84 | HR 86 | Temp 98.2°F | Resp 18 | Ht 64.0 in | Wt 129.6 lb

## 2021-07-16 DIAGNOSIS — C3492 Malignant neoplasm of unspecified part of left bronchus or lung: Secondary | ICD-10-CM

## 2021-07-16 DIAGNOSIS — C7902 Secondary malignant neoplasm of left kidney and renal pelvis: Secondary | ICD-10-CM | POA: Insufficient documentation

## 2021-07-16 DIAGNOSIS — C3412 Malignant neoplasm of upper lobe, left bronchus or lung: Secondary | ICD-10-CM | POA: Diagnosis present

## 2021-07-16 DIAGNOSIS — Z79899 Other long term (current) drug therapy: Secondary | ICD-10-CM | POA: Insufficient documentation

## 2021-07-16 DIAGNOSIS — R590 Localized enlarged lymph nodes: Secondary | ICD-10-CM | POA: Diagnosis not present

## 2021-07-16 DIAGNOSIS — Z9221 Personal history of antineoplastic chemotherapy: Secondary | ICD-10-CM | POA: Diagnosis not present

## 2021-07-16 DIAGNOSIS — Z5112 Encounter for antineoplastic immunotherapy: Secondary | ICD-10-CM | POA: Diagnosis present

## 2021-07-16 DIAGNOSIS — N390 Urinary tract infection, site not specified: Secondary | ICD-10-CM | POA: Insufficient documentation

## 2021-07-16 DIAGNOSIS — K5900 Constipation, unspecified: Secondary | ICD-10-CM | POA: Insufficient documentation

## 2021-07-16 DIAGNOSIS — D649 Anemia, unspecified: Secondary | ICD-10-CM | POA: Insufficient documentation

## 2021-07-16 DIAGNOSIS — C787 Secondary malignant neoplasm of liver and intrahepatic bile duct: Secondary | ICD-10-CM | POA: Insufficient documentation

## 2021-07-16 MED ORDER — SODIUM CHLORIDE 0.9 % IV SOLN
400.0000 mg | Freq: Once | INTRAVENOUS | Status: AC
  Administered 2021-07-16: 400 mg via INTRAVENOUS
  Filled 2021-07-16: qty 16

## 2021-07-16 MED ORDER — SODIUM CHLORIDE 0.9 % IV SOLN
Freq: Once | INTRAVENOUS | Status: AC
Start: 2021-07-16 — End: 2021-07-16
  Filled 2021-07-16: qty 250

## 2021-07-16 NOTE — Progress Notes (Signed)
[facility] OFFICE PROGRESS NOTE   Diagnosis: Non-small cell lung cancer  INTERVAL HISTORY:   Barbara Watkins completed another cycle of pembrolizumab on 06/04/2021.  She feels well.  Good appetite.  No rash or diarrhea.  No new complaint.  She takes tramadol intermittently for back pain.  Objective:  Vital signs in last 24 hours:  Blood pressure 140/84, pulse 86, temperature 98.2 F (36.8 C), temperature source Oral, resp. rate 18, height _0  (1.626 m), weight 129 lb 9.6 oz (58.8 kg), SpO2 90 %.    Lymphatics: No cervical, supraclavicular, or axillary nodes Resp: Lungs clear bilaterally Cardio: Regular rate and rhythm GI: No hepatosplenomegaly Vascular: No leg edema     Lab Results:  Lab Results  Component Value Date   WBC 6.1 05/31/2021   HGB 14.5 05/31/2021   HCT 44.9 05/31/2021   MCV 91.3 05/31/2021   PLT 201 05/31/2021   NEUTROABS 2.7 05/31/2021    CMP  Lab Results  Component Value Date   NA 138 05/31/2021   K 4.2 05/31/2021   CL 104 05/31/2021   CO2 25 05/31/2021   GLUCOSE 95 05/31/2021   BUN 18 05/31/2021   CREATININE 1.23 (H) 05/31/2021   CALCIUM 10.3 05/31/2021   PROT 7.3 05/31/2021   ALBUMIN 4.4 05/31/2021   AST 14 (L) 05/31/2021   ALT 7 05/31/2021   ALKPHOS 88 05/31/2021   BILITOT 0.6 05/31/2021   GFRNONAA 50 (L) 05/31/2021   GFRAA 52 (L) 09/24/2020    No results found for: CEA1, CEA, CAN199, CA125  No results found for: INR, LABPROT  Imaging:  No results found.  Medications: I have reviewed the patient's current medications.   Assessment/Plan:  Metastatic non-small cell lung cancer  CT abdomen/pelvis 06/12/2019-scattered soft tissue masses in left hepatic lobe, right kidney, and the right adrenal gland.  Fullness of the left hilum suspicious for left hilar mass and small nodules at the right lung base.  Also noted signs of right pyelonephritis.   CT chest 06/13/2019- large encasing soft tissue mass left hilum measuring at  least 5.8 x 4.5 x 3.8 cm with narrowing of the left mainstem bronchus and left pulmonary artery.  Near-total obstructive atelectasis of the left upper lobe with a portion of the lingula remaining aerated.  Multiple bilateral pulmonary nodules. Biopsy left supraclavicular lymph node 06/15/2019- metastatic lung adenocarcinoma; immunohistochemistry positive for cytokeratin 7, TTF-1, PAX 8 (weak).  Napsin-A and CDX-2 negative.  Foundation 1- microsatellite stable, tumor mutational burden 15, ATM alteration, KRAS G13D;  tumor proportion score 100% Brain CT 06/29/2019- negative for metastatic disease Cycle 1 carboplatin/Alimta/pembrolizumab 06/29/2019 Cycle 2 carboplatin/Alimta/pembrolizumab 07/20/2019 Cycle 3 carboplatin/Alimta 08/10/2019, pembrolizumab held due to rash Cycle 4 carboplatin/Alimta, Udenyca added, pembrolizumab held 08/31/2019 Cycle 5 Alimta alone (carboplatin discontinued, pembrolizumab held) 09/22/2019 CTs 10/06/2019-decrease in size of central left upper lobe pulmonary mass; bilateral pulmonary nodules of varying size some of which are irregular in shape; many of these are stable in the interval.  Some of the right lung nodules have decreased/resolved since the prior study.  Some new irregular nodules in the left lower lobe indeterminate but potentially infectious/inflammatory.  Marked interval decrease in size of left hepatic and right renal metastases.  Metastatic retroperitoneal lymphadenopathy in the abdomen has clearly decreased.  Metastatic nodules in the right omentum and perirenal fat bilaterally have decreased/resolved.  No new or progressive findings. Cycle 6 Alimta/pembrolizumab 10/17/2019 Cycle 7 Alimta/pembrolizumab 11/08/2019 Cycle 8 Alimta/pembrolizumab  Cycle 9 Pembrolizumab 12/19/2019 (Alimta held due  to elevated creatinine) Cycle 10 Pembrolizumab 01/10/2020 (Alimta held due to borderline creatinine) CTs 01/26/2020-decreased left hilar soft tissue, persistent mass effect on the left  upper lobe pulmonary artery, medial left upper lobe pulmonary lesion has decreased in size with further reexpansion of the left upper lobe, 5 mm right upper lobe nodule more conspicuous, new spiculated nodular densities in the left lower lobe on previous exam have almost completely resolved, decreased left hepatic lesion, decreased right renal lesion, resolved right adrenal lesion decreased retroperitoneal lymphadenopathy Cycle 11 Alimta/pembrolizumab 01/31/2020 Cycle 12 Alimta/pembrolizumab 02/21/2020 Cycle 13 Alimta/pembrolizumab 03/13/2020, Aloxi added for delayed nausea Cycle 14 Alimta/pembrolizumab 04/02/2020, Aloxi plus Emend Cycle 15 pembrolizumab alone 04/27/2020 (Alimta held due to elevated creatinine) Cycle 16 pembrolizumab alone 05/18/2020 CTs 06/05/2020-stable exam.  No significant change in size of medial left upper lobe lung mass.  Unchanged appearance of left hepatic lobe metastasis.  Slight decrease in size of right kidney lesion.  Small pulmonary nodules stable to minimally decreased.  No new or progressive disease. Cycle 17 pembrolizumab 06/06/2020 (6-week dosing) Cycle 18 Pembrolizumab 08/13/2020 (6-week dosing) Cycle 19 pembrolizumab 09/24/2020 (6-week dosing) Cycle 20 Pembrolizumab 11/05/2020 (6-week dosing) CTs 12/13/2020-slight decrease in left hepatic lobe metastases, stable appearance of left upper lung lesion, resolution of tiny peripheral right upper lobe nodule, scattered stable pulmonary nodules, new 6 mm left apical nodule Cycle 21 pembrolizumab 12/17/2020 (6-week dosing) Cycle 22 Pembrolizumab 01/28/2021 Cycle 23 pembrolizumab 03/11/2021 Cycle 24 Pembrolizumab 04/23/2021 CTs 05/31/2021- stable posttreatment changes involving the left upper lobe medially with 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  No findings to suggest recurrent tumor.  Stable scattered pulmonary nodules and groundglass opacities in both lungs.  Stable 5.0 x 4.7 cm partially enhancing necrotic left hepatic lobe  lesion.  No new hepatic metastatic disease.  Stable scarring changes involving the upper pole region of the right kidney with adjacent persistent masslike lesion measuring 19 mm. Cycle 25 Pembrolizumab 06/04/2021 Cycle 26 pembrolizumab 07/16/2021 2. Anemia 3. Urinary tract infection 4. 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 Nausea 6. Constipation 7.  Right abdominal pain secondary to #1 8.  Skin rash bilateral forearms, grade 2, likely related to pembrolizumab 07/20/2019; grade 3 08/03/2019, prednisone initiated; rash improved 08/10/2019, prednisone decreased to 10 mg daily x7 days then discontinued 9.  Nodular right lip lesion 08/13/2020, referred to dermatology 10.  Hypercalcemia-mild chronic elevation, normal PTH level 06/05/2020      Disposition: Barbara Watkins appears stable.  She is in clinical remission from non-small cell lung cancer.  She has completed 2 years of treatment with pembrolizumab.  We discussed the indication for discontinuing pembrolizumab.  She understands some patients can experience a prolonged remission after discontinuation of pembrolizumab, though there is a chance of relapse.  She is most comfortable continuing pembrolizumab on the current schedule.  She will complete another treatment today.  She will return for an office visit and pembrolizumab in 6 weeks.  Betsy Coder, MD  07/16/2021  10:41 AM

## 2021-08-24 ENCOUNTER — Other Ambulatory Visit: Payer: Self-pay | Admitting: Oncology

## 2021-08-27 ENCOUNTER — Inpatient Hospital Stay: Payer: Medicaid Other | Attending: Nurse Practitioner

## 2021-08-27 ENCOUNTER — Other Ambulatory Visit: Payer: Self-pay

## 2021-08-27 ENCOUNTER — Inpatient Hospital Stay: Payer: Medicaid Other

## 2021-08-27 ENCOUNTER — Inpatient Hospital Stay (HOSPITAL_BASED_OUTPATIENT_CLINIC_OR_DEPARTMENT_OTHER): Payer: Medicaid Other | Admitting: Nurse Practitioner

## 2021-08-27 ENCOUNTER — Encounter: Payer: Self-pay | Admitting: Nurse Practitioner

## 2021-08-27 VITALS — BP 150/85 | HR 100 | Temp 98.0°F | Resp 20 | Ht 64.0 in | Wt 128.8 lb

## 2021-08-27 DIAGNOSIS — C3492 Malignant neoplasm of unspecified part of left bronchus or lung: Secondary | ICD-10-CM

## 2021-08-27 DIAGNOSIS — Z9221 Personal history of antineoplastic chemotherapy: Secondary | ICD-10-CM | POA: Diagnosis not present

## 2021-08-27 DIAGNOSIS — C3412 Malignant neoplasm of upper lobe, left bronchus or lung: Secondary | ICD-10-CM | POA: Insufficient documentation

## 2021-08-27 DIAGNOSIS — D649 Anemia, unspecified: Secondary | ICD-10-CM | POA: Insufficient documentation

## 2021-08-27 DIAGNOSIS — Z79899 Other long term (current) drug therapy: Secondary | ICD-10-CM | POA: Insufficient documentation

## 2021-08-27 DIAGNOSIS — Z5112 Encounter for antineoplastic immunotherapy: Secondary | ICD-10-CM | POA: Diagnosis present

## 2021-08-27 DIAGNOSIS — C7902 Secondary malignant neoplasm of left kidney and renal pelvis: Secondary | ICD-10-CM | POA: Diagnosis not present

## 2021-08-27 DIAGNOSIS — C787 Secondary malignant neoplasm of liver and intrahepatic bile duct: Secondary | ICD-10-CM | POA: Insufficient documentation

## 2021-08-27 LAB — CBC WITH DIFFERENTIAL (CANCER CENTER ONLY)
Abs Immature Granulocytes: 0.01 10*3/uL (ref 0.00–0.07)
Basophils Absolute: 0.1 10*3/uL (ref 0.0–0.1)
Basophils Relative: 1 %
Eosinophils Absolute: 0.4 10*3/uL (ref 0.0–0.5)
Eosinophils Relative: 6 %
HCT: 42.3 % (ref 36.0–46.0)
Hemoglobin: 13.8 g/dL (ref 12.0–15.0)
Immature Granulocytes: 0 %
Lymphocytes Relative: 33 %
Lymphs Abs: 2.2 10*3/uL (ref 0.7–4.0)
MCH: 29.4 pg (ref 26.0–34.0)
MCHC: 32.6 g/dL (ref 30.0–36.0)
MCV: 90.2 fL (ref 80.0–100.0)
Monocytes Absolute: 0.4 10*3/uL (ref 0.1–1.0)
Monocytes Relative: 6 %
Neutro Abs: 3.7 10*3/uL (ref 1.7–7.7)
Neutrophils Relative %: 54 %
Platelet Count: 233 10*3/uL (ref 150–400)
RBC: 4.69 MIL/uL (ref 3.87–5.11)
RDW: 15.1 % (ref 11.5–15.5)
WBC Count: 6.9 10*3/uL (ref 4.0–10.5)
nRBC: 0 % (ref 0.0–0.2)

## 2021-08-27 LAB — CMP (CANCER CENTER ONLY)
ALT: 7 U/L (ref 0–44)
AST: 13 U/L — ABNORMAL LOW (ref 15–41)
Albumin: 4.1 g/dL (ref 3.5–5.0)
Alkaline Phosphatase: 93 U/L (ref 38–126)
Anion gap: 9 (ref 5–15)
BUN: 17 mg/dL (ref 8–23)
CO2: 23 mmol/L (ref 22–32)
Calcium: 10.1 mg/dL (ref 8.9–10.3)
Chloride: 104 mmol/L (ref 98–111)
Creatinine: 1.2 mg/dL — ABNORMAL HIGH (ref 0.44–1.00)
GFR, Estimated: 52 mL/min — ABNORMAL LOW (ref >60)
Glucose, Bld: 98 mg/dL (ref 70–99)
Potassium: 4.1 mmol/L (ref 3.5–5.1)
Sodium: 136 mmol/L (ref 135–145)
Total Bilirubin: 0.6 mg/dL (ref 0.3–1.2)
Total Protein: 7.4 g/dL (ref 6.5–8.1)

## 2021-08-27 LAB — TSH: TSH: 5.705 u[IU]/mL — ABNORMAL HIGH (ref 0.350–4.500)

## 2021-08-27 MED ORDER — SODIUM CHLORIDE 0.9 % IV SOLN
400.0000 mg | Freq: Once | INTRAVENOUS | Status: AC
  Administered 2021-08-27: 400 mg via INTRAVENOUS
  Filled 2021-08-27: qty 16

## 2021-08-27 MED ORDER — SODIUM CHLORIDE 0.9 % IV SOLN: Freq: Once | INTRAVENOUS | Status: AC

## 2021-08-27 NOTE — Patient Instructions (Signed)
[facility]    Discharge Instructions:  Thank you for choosing [facility] to provide your oncology and hematology care.   If you have a lab appointment with the [facility], please go directly to the [facility] and check in at the registration area.   Wear comfortable clothing and clothing appropriate for easy access to any Portacath or PICC line.   We strive to give you quality time with your provider. You may need to reschedule your appointment if you arrive late (15 or more minutes).  Arriving late affects you and other patients whose appointments are after yours.  Also, if you miss three or more appointments without notifying the office, you may be dismissed from the clinic at the provider's discretion.      For prescription refill requests, have your pharmacy contact our office and allow 72 hours for refills to be completed.    Today you received the following chemotherapy and/or immunotherapy agents Pembrolizumab (KEYTRUDA).   To help prevent nausea and vomiting after your treatment, we encourage you to take your nausea medication as directed.  BELOW ARE SYMPTOMS THAT SHOULD BE REPORTED IMMEDIATELY: *FEVER GREATER THAN 100.4 F (38 C) OR HIGHER *CHILLS OR SWEATING *NAUSEA AND VOMITING THAT IS NOT CONTROLLED WITH YOUR NAUSEA MEDICATION *UNUSUAL SHORTNESS OF BREATH *UNUSUAL BRUISING OR BLEEDING *URINARY PROBLEMS (pain or burning when urinating, or frequent urination) *BOWEL PROBLEMS (unusual diarrhea, constipation, pain near the anus) TENDERNESS IN MOUTH AND THROAT WITH OR WITHOUT PRESENCE OF ULCERS (sore throat, sores in mouth, or a toothache) UNUSUAL RASH, SWELLING OR PAIN  UNUSUAL VAGINAL DISCHARGE OR ITCHING   Items with * indicate a potential emergency and should be followed up as soon as possible or go to the Emergency Department if any problems should occur.  Please show the CHEMOTHERAPY ALERT CARD or IMMUNOTHERAPY ALERT CARD  at check-in to the Emergency Department and triage nurse.  Should you have questions after your visit or need to cancel or reschedule your appointment, please contact [facility]  Dept: [phone]  and follow the prompts.  Office hours are 8:00 a.m. to 4:30 p.m. Monday - Friday. Please note that voicemails left after 4:00 p.m. may not be returned until the following business day.  We are closed weekends and major holidays. You have access to a nurse at all times for urgent questions. Please call the main number to the clinic Dept: [phone] and follow the prompts.   For any non-urgent questions, you may also contact your provider using MyChart. We now offer e-Visits for anyone 98 and older to request care online for non-urgent symptoms. For details visit [email or web address].   Also download the MyChart app! Go to the app store, search "MyChart", open the app, select [facility], and log in with your MyChart username and password.  Due to Covid, a mask is required upon entering the hospital/clinic. If you do not have a mask, one will be given to you upon arrival. For doctor visits, patients may have 1 support person aged 57 or older with them. For treatment visits, patients cannot have anyone with them due to current Covid guidelines and our immunocompromised population.   Pembrolizumab injection What is this medicine? PEMBROLIZUMAB (pem broe liz ue mab) is a monoclonal antibody. It is used to treat certain types of cancer. This medicine may be used for other purposes; ask your health care provider or pharmacist if you have questions. COMMON BRAND NAME(S): [employer]  What should I tell my health care provider before I take this medicine? They need to know if you have any of these conditions: autoimmune diseases like Crohn's disease, ulcerative colitis, or lupus have had or planning to have an allogeneic stem cell transplant (uses someone else's stem cells) history  of organ transplant history of chest radiation nervous system problems like myasthenia gravis or Guillain-Barre syndrome an unusual or allergic reaction to pembrolizumab, other medicines, foods, dyes, or preservatives pregnant or trying to get pregnant breast-feeding How should I use this medicine? This medicine is for infusion into a vein. It is given by a health care professional in a hospital or clinic setting. A special MedGuide will be given to you before each treatment. Be sure to read this information carefully each time. Talk to your pediatrician regarding the use of this medicine in children. While this drug may be prescribed for children as young as 6 months for selected conditions, precautions do apply. Overdosage: If you think you have taken too much of this medicine contact a poison control center or emergency room at once. NOTE: This medicine is only for you. Do not share this medicine with others. What if I miss a dose? It is important not to miss your dose. Call your doctor or health care professional if you are unable to keep an appointment. What may interact with this medicine? Interactions have not been studied. This list may not describe all possible interactions. Give your health care provider a list of all the medicines, herbs, non-prescription drugs, or dietary supplements you use. Also tell them if you smoke, drink alcohol, or use illegal drugs. Some items may interact with your medicine. What should I watch for while using this medicine? Your condition will be monitored carefully while you are receiving this medicine. You may need blood work done while you are taking this medicine. Do not become pregnant while taking this medicine or for 4 months after stopping it. Women should inform their doctor if they wish to become pregnant or think they might be pregnant. There is a potential for serious side effects to an unborn child. Talk to your health care professional or  pharmacist for more information. Do not breast-feed an infant while taking this medicine or for 4 months after the last dose. What side effects may I notice from receiving this medicine? Side effects that you should report to your doctor or health care professional as soon as possible: allergic reactions like skin rash, itching or hives, swelling of the face, lips, or tongue bloody or black, tarry breathing problems changes in vision chest pain chills confusion constipation cough diarrhea dizziness or feeling faint or lightheaded fast or irregular heartbeat fever flushing joint pain low blood counts - this medicine may decrease the number of white blood cells, red blood cells and platelets. You may be at increased risk for infections and bleeding. muscle pain muscle weakness pain, tingling, numbness in the hands or feet persistent headache redness, blistering, peeling or loosening of the skin, including inside the mouth signs and symptoms of high blood sugar such as dizziness; dry mouth; dry skin; fruity breath; nausea; stomach pain; increased hunger or thirst; increased urination signs and symptoms of kidney injury like trouble passing urine or change in the amount of urine signs and symptoms of liver injury like dark urine, light-colored stools, loss of appetite, nausea, right upper belly pain, yellowing of the eyes or skin sweating swollen lymph nodes weight loss Side effects that usually do not  require medical attention (report to your doctor or health care professional if they continue or are bothersome): decreased appetite hair loss tiredness This list may not describe all possible side effects. Call your doctor for medical advice about side effects. You may report side effects to FDA at 1-800-FDA-1088. Where should I keep my medicine? This drug is given in a hospital or clinic and will not be stored at home. NOTE: This sheet is a summary. It may not cover all possible  information. If you have questions about this medicine, talk to your doctor, pharmacist, or health care provider.  2021 Elsevier/Gold Standard (2019-11-16 21:44:53)

## 2021-08-27 NOTE — Progress Notes (Signed)
[facility] OFFICE PROGRESS NOTE   Diagnosis: Non-small cell lung cancer  INTERVAL HISTORY:   Barbara Watkins returns as scheduled.  She completed another cycle of Pembrolizumab 07/16/2021.  She denies nausea/vomiting.  No mouth sores.  No diarrhea.  No rash.  She has a good appetite.  No pain.  Objective:  Vital signs in last 24 hours:  Blood pressure (!) 150/85, pulse 100, temperature 98 F (36.7 C), temperature source Oral, resp. rate 20, height $RemoveBe'5\' 4"'BkPTRrcgm$  (1.626 m), weight 128 lb 12.8 oz (58.4 kg), SpO2 100 %.    HEENT: No thrush or ulcers. Resp: Lungs clear bilaterally. Cardio: Regular rate and rhythm. GI: Abdomen soft and nontender.  No hepatomegaly. Vascular: No leg edema. Skin: No rash.   Lab Results:  Lab Results  Component Value Date   WBC 6.9 08/27/2021   HGB 13.8 08/27/2021   HCT 42.3 08/27/2021   MCV 90.2 08/27/2021   PLT 233 08/27/2021   NEUTROABS 3.7 08/27/2021    Imaging:  No results found.  Medications: I have reviewed the patient's current medications.  Assessment/Plan:  Metastatic non-small cell lung cancer  CT abdomen/pelvis 06/12/2019-scattered soft tissue masses in left hepatic lobe, right kidney, and the right adrenal gland.  Fullness of the left hilum suspicious for left hilar mass and small nodules at the right lung base.  Also noted signs of right pyelonephritis.   CT chest 06/13/2019- large encasing soft tissue mass left hilum measuring at least 5.8 x 4.5 x 3.8 cm with narrowing of the left mainstem bronchus and left pulmonary artery.  Near-total obstructive atelectasis of the left upper lobe with a portion of the lingula remaining aerated.  Multiple bilateral pulmonary nodules. Biopsy left supraclavicular lymph node 06/15/2019- metastatic lung adenocarcinoma; immunohistochemistry positive for cytokeratin 7, TTF-1, PAX 8 (weak).  Napsin-A and CDX-2 negative.  Foundation 1- microsatellite stable, tumor mutational burden 15, ATM alteration,  KRAS G13D;  tumor proportion score 100% Brain CT 06/29/2019- negative for metastatic disease Cycle 1 carboplatin/Alimta/pembrolizumab 06/29/2019 Cycle 2 carboplatin/Alimta/pembrolizumab 07/20/2019 Cycle 3 carboplatin/Alimta 08/10/2019, pembrolizumab held due to rash Cycle 4 carboplatin/Alimta, Udenyca added, pembrolizumab held 08/31/2019 Cycle 5 Alimta alone (carboplatin discontinued, pembrolizumab held) 09/22/2019 CTs 10/06/2019-decrease in size of central left upper lobe pulmonary mass; bilateral pulmonary nodules of varying size some of which are irregular in shape; many of these are stable in the interval.  Some of the right lung nodules have decreased/resolved since the prior study.  Some new irregular nodules in the left lower lobe indeterminate but potentially infectious/inflammatory.  Marked interval decrease in size of left hepatic and right renal metastases.  Metastatic retroperitoneal lymphadenopathy in the abdomen has clearly decreased.  Metastatic nodules in the right omentum and perirenal fat bilaterally have decreased/resolved.  No new or progressive findings. Cycle 6 Alimta/pembrolizumab 10/17/2019 Cycle 7 Alimta/pembrolizumab 11/08/2019 Cycle 8 Alimta/pembrolizumab  Cycle 9 Pembrolizumab 12/19/2019 (Alimta held due to elevated creatinine) Cycle 10 Pembrolizumab 01/10/2020 (Alimta held due to borderline creatinine) CTs 01/26/2020-decreased left hilar soft tissue, persistent mass effect on the left upper lobe pulmonary artery, medial left upper lobe pulmonary lesion has decreased in size with further reexpansion of the left upper lobe, 5 mm right upper lobe nodule more conspicuous, new spiculated nodular densities in the left lower lobe on previous exam have almost completely resolved, decreased left hepatic lesion, decreased right renal lesion, resolved right adrenal lesion decreased retroperitoneal lymphadenopathy Cycle 11 Alimta/pembrolizumab 01/31/2020 Cycle 12 Alimta/pembrolizumab  02/21/2020 Cycle 13 Alimta/pembrolizumab 03/13/2020, Aloxi added for delayed nausea  Cycle 14 Alimta/pembrolizumab 04/02/2020, Aloxi plus Emend Cycle 15 pembrolizumab alone 04/27/2020 (Alimta held due to elevated creatinine) Cycle 16 pembrolizumab alone 05/18/2020 CTs 06/05/2020-stable exam.  No significant change in size of medial left upper lobe lung mass.  Unchanged appearance of left hepatic lobe metastasis.  Slight decrease in size of right kidney lesion.  Small pulmonary nodules stable to minimally decreased.  No new or progressive disease. Cycle 17 pembrolizumab 06/06/2020 (6-week dosing) Cycle 18 Pembrolizumab 08/13/2020 (6-week dosing) Cycle 19 pembrolizumab 09/24/2020 (6-week dosing) Cycle 20 Pembrolizumab 11/05/2020 (6-week dosing) CTs 12/13/2020-slight decrease in left hepatic lobe metastases, stable appearance of left upper lung lesion, resolution of tiny peripheral right upper lobe nodule, scattered stable pulmonary nodules, new 6 mm left apical nodule Cycle 21 pembrolizumab 12/17/2020 (6-week dosing) Cycle 22 Pembrolizumab 01/28/2021 Cycle 23 pembrolizumab 03/11/2021 Cycle 24 Pembrolizumab 04/23/2021 CTs 05/31/2021- stable posttreatment changes involving the left upper lobe medially with 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  No findings to suggest recurrent tumor.  Stable scattered pulmonary nodules and groundglass opacities in both lungs.  Stable 5.0 x 4.7 cm partially enhancing necrotic left hepatic lobe lesion.  No new hepatic metastatic disease.  Stable scarring changes involving the upper pole region of the right kidney with adjacent persistent masslike lesion measuring 19 mm. Cycle 25 Pembrolizumab 06/04/2021 Cycle 26 pembrolizumab 07/16/2021 Cycle 27 Pembrolizumab 08/27/2021 2. Anemia 3. Urinary tract infection 4. 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 Nausea 6. Constipation 7.  Right abdominal pain secondary to #1 8.  Skin rash bilateral forearms, grade 2, likely  related to pembrolizumab 07/20/2019; grade 3 08/03/2019, prednisone initiated; rash improved 08/10/2019, prednisone decreased to 10 mg daily x7 days then discontinued 9.  Nodular right lip lesion 08/13/2020, referred to dermatology 10.  Hypercalcemia-mild chronic elevation, normal PTH level 06/05/2020    Disposition: Barbara Watkins appears well.  She has completed 26 cycles of Pembrolizumab.  There is no clinical evidence of disease progression.  Plan to proceed with cycle 27 today as scheduled.  CBC and chemistry panel reviewed.  Labs adequate to proceed as above.  She will return for lab, follow-up, Pembrolizumab in 6 weeks.    Ned Card ANP/GNP-BC   08/27/2021  11:29 AM

## 2021-10-05 ENCOUNTER — Other Ambulatory Visit: Payer: Self-pay | Admitting: Oncology

## 2021-10-08 ENCOUNTER — Inpatient Hospital Stay: Payer: Medicaid Other

## 2021-10-08 ENCOUNTER — Inpatient Hospital Stay: Payer: Medicaid Other | Attending: Nurse Practitioner | Admitting: Oncology

## 2021-10-08 DIAGNOSIS — Z5112 Encounter for antineoplastic immunotherapy: Secondary | ICD-10-CM | POA: Insufficient documentation

## 2021-10-08 DIAGNOSIS — C787 Secondary malignant neoplasm of liver and intrahepatic bile duct: Secondary | ICD-10-CM | POA: Insufficient documentation

## 2021-10-08 DIAGNOSIS — C7902 Secondary malignant neoplasm of left kidney and renal pelvis: Secondary | ICD-10-CM | POA: Insufficient documentation

## 2021-10-08 DIAGNOSIS — C3412 Malignant neoplasm of upper lobe, left bronchus or lung: Secondary | ICD-10-CM | POA: Insufficient documentation

## 2021-10-08 DIAGNOSIS — Z23 Encounter for immunization: Secondary | ICD-10-CM | POA: Insufficient documentation

## 2021-10-08 DIAGNOSIS — D649 Anemia, unspecified: Secondary | ICD-10-CM | POA: Insufficient documentation

## 2021-10-08 DIAGNOSIS — Z79899 Other long term (current) drug therapy: Secondary | ICD-10-CM | POA: Insufficient documentation

## 2021-10-08 DIAGNOSIS — Z9221 Personal history of antineoplastic chemotherapy: Secondary | ICD-10-CM | POA: Insufficient documentation

## 2021-10-14 ENCOUNTER — Inpatient Hospital Stay (HOSPITAL_BASED_OUTPATIENT_CLINIC_OR_DEPARTMENT_OTHER): Payer: Medicaid Other | Admitting: Nurse Practitioner

## 2021-10-14 ENCOUNTER — Inpatient Hospital Stay: Payer: Medicaid Other

## 2021-10-14 ENCOUNTER — Encounter: Payer: Self-pay | Admitting: Nurse Practitioner

## 2021-10-14 ENCOUNTER — Other Ambulatory Visit: Payer: Self-pay

## 2021-10-14 VITALS — BP 164/94 | HR 99 | Temp 97.8°F | Resp 18 | Ht 64.0 in | Wt 128.6 lb

## 2021-10-14 DIAGNOSIS — C7902 Secondary malignant neoplasm of left kidney and renal pelvis: Secondary | ICD-10-CM | POA: Diagnosis not present

## 2021-10-14 DIAGNOSIS — C3492 Malignant neoplasm of unspecified part of left bronchus or lung: Secondary | ICD-10-CM

## 2021-10-14 DIAGNOSIS — C787 Secondary malignant neoplasm of liver and intrahepatic bile duct: Secondary | ICD-10-CM | POA: Diagnosis not present

## 2021-10-14 DIAGNOSIS — Z23 Encounter for immunization: Secondary | ICD-10-CM

## 2021-10-14 DIAGNOSIS — D649 Anemia, unspecified: Secondary | ICD-10-CM | POA: Diagnosis not present

## 2021-10-14 DIAGNOSIS — C3412 Malignant neoplasm of upper lobe, left bronchus or lung: Secondary | ICD-10-CM | POA: Diagnosis present

## 2021-10-14 DIAGNOSIS — Z9221 Personal history of antineoplastic chemotherapy: Secondary | ICD-10-CM | POA: Diagnosis not present

## 2021-10-14 DIAGNOSIS — Z79899 Other long term (current) drug therapy: Secondary | ICD-10-CM | POA: Diagnosis not present

## 2021-10-14 DIAGNOSIS — Z5112 Encounter for antineoplastic immunotherapy: Secondary | ICD-10-CM | POA: Diagnosis not present

## 2021-10-14 LAB — CBC WITH DIFFERENTIAL (CANCER CENTER ONLY)
Abs Immature Granulocytes: 0.02 10*3/uL (ref 0.00–0.07)
Basophils Absolute: 0.1 10*3/uL (ref 0.0–0.1)
Basophils Relative: 1 %
Eosinophils Absolute: 0.2 10*3/uL (ref 0.0–0.5)
Eosinophils Relative: 3 %
HCT: 42.3 % (ref 36.0–46.0)
Hemoglobin: 14 g/dL (ref 12.0–15.0)
Immature Granulocytes: 0 %
Lymphocytes Relative: 30 %
Lymphs Abs: 2.2 10*3/uL (ref 0.7–4.0)
MCH: 30.2 pg (ref 26.0–34.0)
MCHC: 33.1 g/dL (ref 30.0–36.0)
MCV: 91.4 fL (ref 80.0–100.0)
Monocytes Absolute: 0.4 10*3/uL (ref 0.1–1.0)
Monocytes Relative: 6 %
Neutro Abs: 4.5 10*3/uL (ref 1.7–7.7)
Neutrophils Relative %: 60 %
Platelet Count: 235 10*3/uL (ref 150–400)
RBC: 4.63 MIL/uL (ref 3.87–5.11)
RDW: 15.5 % (ref 11.5–15.5)
WBC Count: 7.4 10*3/uL (ref 4.0–10.5)
nRBC: 0 % (ref 0.0–0.2)

## 2021-10-14 LAB — CMP (CANCER CENTER ONLY)
ALT: 7 U/L (ref 0–44)
AST: 15 U/L (ref 15–41)
Albumin: 4.3 g/dL (ref 3.5–5.0)
Alkaline Phosphatase: 99 U/L (ref 38–126)
Anion gap: 11 (ref 5–15)
BUN: 13 mg/dL (ref 8–23)
CO2: 22 mmol/L (ref 22–32)
Calcium: 10.3 mg/dL (ref 8.9–10.3)
Chloride: 104 mmol/L (ref 98–111)
Creatinine: 1.13 mg/dL — ABNORMAL HIGH (ref 0.44–1.00)
GFR, Estimated: 55 mL/min — ABNORMAL LOW (ref >60)
Glucose, Bld: 95 mg/dL (ref 70–99)
Potassium: 4 mmol/L (ref 3.5–5.1)
Sodium: 137 mmol/L (ref 135–145)
Total Bilirubin: 0.6 mg/dL (ref 0.3–1.2)
Total Protein: 7.1 g/dL (ref 6.5–8.1)

## 2021-10-14 LAB — TSH: TSH: 2.875 u[IU]/mL (ref 0.350–4.500)

## 2021-10-14 MED ORDER — SODIUM CHLORIDE 0.9 % IV SOLN
400.0000 mg | Freq: Once | INTRAVENOUS | Status: AC
  Administered 2021-10-14: 400 mg via INTRAVENOUS
  Filled 2021-10-14: qty 16

## 2021-10-14 MED ORDER — SODIUM CHLORIDE 0.9 % IV SOLN: Freq: Once | INTRAVENOUS | Status: AC

## 2021-10-14 MED ORDER — INFLUENZA VAC SPLIT QUAD 0.5 ML IM SUSY
0.5000 mL | PREFILLED_SYRINGE | Freq: Once | INTRAMUSCULAR | Status: AC
  Administered 2021-10-14: 0.5 mL via INTRAMUSCULAR
  Filled 2021-10-14: qty 0.5

## 2021-10-14 NOTE — Progress Notes (Signed)
[facility] OFFICE PROGRESS NOTE   Diagnosis: Non-small cell lung cancer  INTERVAL HISTORY:   Barbara Watkins returns as scheduled.  She completed another cycle of Pembrolizumab 08/27/2021.  She had a "sinus infection" last week.  She reports a COVID test was negative.  She feels well at present.  Symptoms have resolved.  No rash or diarrhea.  No nausea or vomiting.  She has a good appetite.  Objective:  Vital signs in last 24 hours:  Blood pressure (!) 164/94, pulse 99, temperature 97.8 F (36.6 C), temperature source Oral, resp. rate 18, height _0  (1.626 m), weight 128 lb 9.6 oz (58.3 kg), SpO2 98 %.    HEENT: No thrush or ulcers. Lymphatics: No palpable cervical or supraclavicular lymph nodes. Resp: Lungs clear bilaterally. Cardio: Regular rate and rhythm. GI: No hepatosplenomegaly. Vascular: No leg edema. Skin: No rash.   Lab Results:  Lab Results  Component Value Date   WBC 7.4 10/14/2021   HGB 14.0 10/14/2021   HCT 42.3 10/14/2021   MCV 91.4 10/14/2021   PLT 235 10/14/2021   NEUTROABS 4.5 10/14/2021    Imaging:  No results found.  Medications: I have reviewed the patient's current medications.  Assessment/Plan:  Metastatic non-small cell lung cancer  CT abdomen/pelvis 06/12/2019-scattered soft tissue masses in left hepatic lobe, right kidney, and the right adrenal gland.  Fullness of the left hilum suspicious for left hilar mass and small nodules at the right lung base.  Also noted signs of right pyelonephritis.   CT chest 06/13/2019- large encasing soft tissue mass left hilum measuring at least 5.8 x 4.5 x 3.8 cm with narrowing of the left mainstem bronchus and left pulmonary artery.  Near-total obstructive atelectasis of the left upper lobe with a portion of the lingula remaining aerated.  Multiple bilateral pulmonary nodules. Biopsy left supraclavicular lymph node 06/15/2019- metastatic lung adenocarcinoma; immunohistochemistry positive for  cytokeratin 7, TTF-1, PAX 8 (weak).  Napsin-A and CDX-2 negative.  Foundation 1- microsatellite stable, tumor mutational burden 15, ATM alteration, KRAS G13D;  tumor proportion score 100% Brain CT 06/29/2019- negative for metastatic disease Cycle 1 carboplatin/Alimta/pembrolizumab 06/29/2019 Cycle 2 carboplatin/Alimta/pembrolizumab 07/20/2019 Cycle 3 carboplatin/Alimta 08/10/2019, pembrolizumab held due to rash Cycle 4 carboplatin/Alimta, Udenyca added, pembrolizumab held 08/31/2019 Cycle 5 Alimta alone (carboplatin discontinued, pembrolizumab held) 09/22/2019 CTs 10/06/2019-decrease in size of central left upper lobe pulmonary mass; bilateral pulmonary nodules of varying size some of which are irregular in shape; many of these are stable in the interval.  Some of the right lung nodules have decreased/resolved since the prior study.  Some new irregular nodules in the left lower lobe indeterminate but potentially infectious/inflammatory.  Marked interval decrease in size of left hepatic and right renal metastases.  Metastatic retroperitoneal lymphadenopathy in the abdomen has clearly decreased.  Metastatic nodules in the right omentum and perirenal fat bilaterally have decreased/resolved.  No new or progressive findings. Cycle 6 Alimta/pembrolizumab 10/17/2019 Cycle 7 Alimta/pembrolizumab 11/08/2019 Cycle 8 Alimta/pembrolizumab  Cycle 9 Pembrolizumab 12/19/2019 (Alimta held due to elevated creatinine) Cycle 10 Pembrolizumab 01/10/2020 (Alimta held due to borderline creatinine) CTs 01/26/2020-decreased left hilar soft tissue, persistent mass effect on the left upper lobe pulmonary artery, medial left upper lobe pulmonary lesion has decreased in size with further reexpansion of the left upper lobe, 5 mm right upper lobe nodule more conspicuous, new spiculated nodular densities in the left lower lobe on previous exam have almost completely resolved, decreased left hepatic lesion, decreased right renal lesion, resolved  right  adrenal lesion decreased retroperitoneal lymphadenopathy Cycle 11 Alimta/pembrolizumab 01/31/2020 Cycle 12 Alimta/pembrolizumab 02/21/2020 Cycle 13 Alimta/pembrolizumab 03/13/2020, Aloxi added for delayed nausea Cycle 14 Alimta/pembrolizumab 04/02/2020, Aloxi plus Emend Cycle 15 pembrolizumab alone 04/27/2020 (Alimta held due to elevated creatinine) Cycle 16 pembrolizumab alone 05/18/2020 CTs 06/05/2020-stable exam.  No significant change in size of medial left upper lobe lung mass.  Unchanged appearance of left hepatic lobe metastasis.  Slight decrease in size of right kidney lesion.  Small pulmonary nodules stable to minimally decreased.  No new or progressive disease. Cycle 17 pembrolizumab 06/06/2020 (6-week dosing) Cycle 18 Pembrolizumab 08/13/2020 (6-week dosing) Cycle 19 pembrolizumab 09/24/2020 (6-week dosing) Cycle 20 Pembrolizumab 11/05/2020 (6-week dosing) CTs 12/13/2020-slight decrease in left hepatic lobe metastases, stable appearance of left upper lung lesion, resolution of tiny peripheral right upper lobe nodule, scattered stable pulmonary nodules, new 6 mm left apical nodule Cycle 21 pembrolizumab 12/17/2020 (6-week dosing) Cycle 22 Pembrolizumab 01/28/2021 Cycle 23 pembrolizumab 03/11/2021 Cycle 24 Pembrolizumab 04/23/2021 CTs 05/31/2021- stable posttreatment changes involving the left upper lobe medially with 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  No findings to suggest recurrent tumor.  Stable scattered pulmonary nodules and groundglass opacities in both lungs.  Stable 5.0 x 4.7 cm partially enhancing necrotic left hepatic lobe lesion.  No new hepatic metastatic disease.  Stable scarring changes involving the upper pole region of the right kidney with adjacent persistent masslike lesion measuring 19 mm. Cycle 25 Pembrolizumab 06/04/2021 Cycle 26 pembrolizumab 07/16/2021 Cycle 27 Pembrolizumab 08/27/2021 Cycle 28 Pembrolizumab 10/14/2021 2. Anemia 3. Urinary tract infection 4. PAD,  status post right forefoot amputation 05/06/2019 5. Nausea 6. Constipation 7.  Right abdominal pain secondary to #1 8.  Skin rash bilateral forearms, grade 2, likely related to pembrolizumab 07/20/2019; grade 3 08/03/2019, prednisone initiated; rash improved 08/10/2019, prednisone decreased to 10 mg daily x7 days then discontinued 9.  Nodular right lip lesion 08/13/2020, referred to dermatology 10.  Hypercalcemia-mild chronic elevation, normal PTH level 06/05/2020  Disposition: Ms. Mousseau appears stable.  There is no clinical evidence of disease progression.  She we will continue Pembrolizumab on a 6-week schedule, treatment today.  We reviewed the CBC from today.  Counts adequate to proceed as above.  She will return for lab, follow-up, Pembrolizumab in 6 weeks.  We are available to see her sooner if needed.  Ned Card ANP/GNP-BC   10/14/2021  1:35 PM

## 2021-10-14 NOTE — Progress Notes (Signed)
Patient presents for treatment. RN assessment completed along with the following:  Labs/vitals reviewed - Yes, and within treatment parameters.   Weight within 10% of previous measurement - Yes Oncology Treatment Attestation completed for current therapy- Yes, on date 06/20/19 Informed consent completed and reflects current therapy/intent - Yes, on date 06/29/19             Provider progress note reviewed - Today's provider note is not yet available. I reviewed the most recent oncology provider progress note in chart dated 08/27/21. Treatment/Antibody/Supportive plan reviewed - Yes, and there are no adjustments needed for today's treatment. S&H and other orders reviewed - Yes, and there are no additional orders identified. Previous treatment date reviewed - Yes, and the appropriate amount of time has elapsed between treatments. Clinic Hand Off Received from - Ned Card, NP  Patient to proceed with treatment.

## 2021-10-14 NOTE — Patient Instructions (Signed)
[facility]    Discharge Instructions:  Thank you for choosing [facility] to provide your oncology and hematology care.   If you have a lab appointment with the [facility], please go directly to the [facility] and check in at the registration area.   Wear comfortable clothing and clothing appropriate for easy access to any Portacath or PICC line.   We strive to give you quality time with your provider. You may need to reschedule your appointment if you arrive late (15 or more minutes).  Arriving late affects you and other patients whose appointments are after yours.  Also, if you miss three or more appointments without notifying the office, you may be dismissed from the clinic at the provider's discretion.      For prescription refill requests, have your pharmacy contact our office and allow 72 hours for refills to be completed.    Today you received the following chemotherapy and/or immunotherapy agents Pembrolizumab (KEYTRUDA).   To help prevent nausea and vomiting after your treatment, we encourage you to take your nausea medication as directed.  BELOW ARE SYMPTOMS THAT SHOULD BE REPORTED IMMEDIATELY: *FEVER GREATER THAN 100.4 F (38 C) OR HIGHER *CHILLS OR SWEATING *NAUSEA AND VOMITING THAT IS NOT CONTROLLED WITH YOUR NAUSEA MEDICATION *UNUSUAL SHORTNESS OF BREATH *UNUSUAL BRUISING OR BLEEDING *URINARY PROBLEMS (pain or burning when urinating, or frequent urination) *BOWEL PROBLEMS (unusual diarrhea, constipation, pain near the anus) TENDERNESS IN MOUTH AND THROAT WITH OR WITHOUT PRESENCE OF ULCERS (sore throat, sores in mouth, or a toothache) UNUSUAL RASH, SWELLING OR PAIN  UNUSUAL VAGINAL DISCHARGE OR ITCHING   Items with * indicate a potential emergency and should be followed up as soon as possible or go to the Emergency Department if any problems should occur.  Please show the CHEMOTHERAPY ALERT CARD or IMMUNOTHERAPY ALERT CARD  at check-in to the Emergency Department and triage nurse.  Should you have questions after your visit or need to cancel or reschedule your appointment, please contact [facility]  Dept: [phone]  and follow the prompts.  Office hours are 8:00 a.m. to 4:30 p.m. Monday - Friday. Please note that voicemails left after 4:00 p.m. may not be returned until the following business day.  We are closed weekends and major holidays. You have access to a nurse at all times for urgent questions. Please call the main number to the clinic Dept: [phone] and follow the prompts.   For any non-urgent questions, you may also contact your provider using MyChart. We now offer e-Visits for anyone 61 and older to request care online for non-urgent symptoms. For details visit [email or web address].   Also download the MyChart app! Go to the app store, search "MyChart", open the app, select [facility], and log in with your MyChart username and password.  Due to Covid, a mask is required upon entering the hospital/clinic. If you do not have a mask, one will be given to you upon arrival. For doctor visits, patients may have 1 support person aged 57 or older with them. For treatment visits, patients cannot have anyone with them due to current Covid guidelines and our immunocompromised population.   Pembrolizumab injection What is this medicine? PEMBROLIZUMAB (pem broe liz ue mab) is a monoclonal antibody. It is used to treat certain types of cancer. This medicine may be used for other purposes; ask your health care provider or pharmacist if you have questions. COMMON BRAND NAME(S): [employer]  What should I tell my health care provider before I take this medicine? They need to know if you have any of these conditions: autoimmune diseases like Crohn's disease, ulcerative colitis, or lupus have had or planning to have an allogeneic stem cell transplant (uses someone else's stem cells) history  of organ transplant history of chest radiation nervous system problems like myasthenia gravis or Guillain-Barre syndrome an unusual or allergic reaction to pembrolizumab, other medicines, foods, dyes, or preservatives pregnant or trying to get pregnant breast-feeding How should I use this medicine? This medicine is for infusion into a vein. It is given by a health care professional in a hospital or clinic setting. A special MedGuide will be given to you before each treatment. Be sure to read this information carefully each time. Talk to your pediatrician regarding the use of this medicine in children. While this drug may be prescribed for children as young as 6 months for selected conditions, precautions do apply. Overdosage: If you think you have taken too much of this medicine contact a poison control center or emergency room at once. NOTE: This medicine is only for you. Do not share this medicine with others. What if I miss a dose? It is important not to miss your dose. Call your doctor or health care professional if you are unable to keep an appointment. What may interact with this medicine? Interactions have not been studied. This list may not describe all possible interactions. Give your health care provider a list of all the medicines, herbs, non-prescription drugs, or dietary supplements you use. Also tell them if you smoke, drink alcohol, or use illegal drugs. Some items may interact with your medicine. What should I watch for while using this medicine? Your condition will be monitored carefully while you are receiving this medicine. You may need blood work done while you are taking this medicine. Do not become pregnant while taking this medicine or for 4 months after stopping it. Women should inform their doctor if they wish to become pregnant or think they might be pregnant. There is a potential for serious side effects to an unborn child. Talk to your health care professional or  pharmacist for more information. Do not breast-feed an infant while taking this medicine or for 4 months after the last dose. What side effects may I notice from receiving this medicine? Side effects that you should report to your doctor or health care professional as soon as possible: allergic reactions like skin rash, itching or hives, swelling of the face, lips, or tongue bloody or black, tarry breathing problems changes in vision chest pain chills confusion constipation cough diarrhea dizziness or feeling faint or lightheaded fast or irregular heartbeat fever flushing joint pain low blood counts - this medicine may decrease the number of white blood cells, red blood cells and platelets. You may be at increased risk for infections and bleeding. muscle pain muscle weakness pain, tingling, numbness in the hands or feet persistent headache redness, blistering, peeling or loosening of the skin, including inside the mouth signs and symptoms of high blood sugar such as dizziness; dry mouth; dry skin; fruity breath; nausea; stomach pain; increased hunger or thirst; increased urination signs and symptoms of kidney injury like trouble passing urine or change in the amount of urine signs and symptoms of liver injury like dark urine, light-colored stools, loss of appetite, nausea, right upper belly pain, yellowing of the eyes or skin sweating swollen lymph nodes weight loss Side effects that usually do not  require medical attention (report to your doctor or health care professional if they continue or are bothersome): decreased appetite hair loss tiredness This list may not describe all possible side effects. Call your doctor for medical advice about side effects. You may report side effects to FDA at 1-800-FDA-1088. Where should I keep my medicine? This drug is given in a hospital or clinic and will not be stored at home. NOTE: This sheet is a summary. It may not cover all possible  information. If you have questions about this medicine, talk to your doctor, pharmacist, or health care provider.  2021 Elsevier/Gold Standard (2019-11-16 21:44:53)  Influenza Virus Vaccine injection What is this medication? INFLUENZA VIRUS VACCINE (in floo EN zuh VAHY ruhs vak SEEN) helps to reduce the risk of getting influenza also known as the flu. The vaccine only helps protect you against some strains of the flu. This medicine may be used for other purposes; ask your health care provider or pharmacist if you have questions. COMMON BRAND NAME(S): Afluria, Afluria Quadrivalent, Agriflu, Alfuria, FLUAD, FLUAD Quadrivalent, Fluarix, Fluarix Quadrivalent, Flublok, Flublok Quadrivalent, FLUCELVAX, FLUCELVAX Quadrivalent, Flulaval, Flulaval Quadrivalent, Fluvirin, Fluzone, Fluzone High-Dose, Fluzone Intradermal, Fluzone Quadrivalent What should I tell my care team before I take this medication? They need to know if you have any of these conditions: bleeding disorder like hemophilia fever or infection Guillain-Barre syndrome or other neurological problems immune system problems infection with the human immunodeficiency virus (HIV) or AIDS low blood platelet counts multiple sclerosis an unusual or allergic reaction to influenza virus vaccine, latex, other medicines, foods, dyes, or preservatives. Different brands of vaccines contain different allergens. Some may contain latex or eggs. Talk to your doctor about your allergies to make sure that you get the right vaccine. pregnant or trying to get pregnant breast-feeding How should I use this medication? This vaccine is for injection into a muscle or under the skin. It is given by a health care professional. A copy of Vaccine Information Statements will be given before each vaccination. Read this sheet carefully each time. The sheet may change frequently. Talk to your healthcare provider to see which vaccines are right for you. Some vaccines should  not be used in all age groups. Overdosage: If you think you have taken too much of this medicine contact a poison control center or emergency room at once. NOTE: This medicine is only for you. Do not share this medicine with others. What if I miss a dose? This does not apply. What may interact with this medication? chemotherapy or radiation therapy medicines that lower your immune system like etanercept, anakinra, infliximab, and adalimumab medicines that treat or prevent blood clots like warfarin phenytoin steroid medicines like prednisone or cortisone theophylline vaccines This list may not describe all possible interactions. Give your health care provider a list of all the medicines, herbs, non-prescription drugs, or dietary supplements you use. Also tell them if you smoke, drink alcohol, or use illegal drugs. Some items may interact with your medicine. What should I watch for while using this medication? Report any side effects that do not go away within 3 days to your doctor or health care professional. Call your health care provider if any unusual symptoms occur within 6 weeks of receiving this vaccine. You may still catch the flu, but the illness is not usually as bad. You cannot get the flu from the vaccine. The vaccine will not protect against colds or other illnesses that may cause fever. The vaccine is needed every year.  What side effects may I notice from receiving this medication? Side effects that you should report to your doctor or health care professional as soon as possible: allergic reactions like skin rash, itching or hives, swelling of the face, lips, or tongue Side effects that usually do not require medical attention (report to your doctor or health care professional if they continue or are bothersome): fever headache muscle aches and pains pain, tenderness, redness, or swelling at the injection site tiredness Side effects that you should report to your doctor or  health care professional as soon as possible: allergic reactions like skin rash, itching or hives, swelling of the face, lips, or tongue Side effects that usually do not require medical attention (report to your doctor or health care professional if they continue or are bothersome): fever headache muscle aches and pains pain, tenderness, redness, or swelling at the injection site tiredness This list may not describe all possible side effects. Call your doctor for medical advice about side effects. You may report side effects to FDA at 1-800-FDA-1088. Where should I keep my medication? The vaccine will be given by a health care professional in a clinic, pharmacy, doctor's office, or other health care setting. You will not be given vaccine doses to store at home. NOTE: This sheet is a summary. It may not cover all possible information. If you have questions about this medicine, talk to your doctor, pharmacist, or health care provider.  2022 Elsevier/Gold Standard (2020-08-21 19:49:22)

## 2021-11-24 ENCOUNTER — Other Ambulatory Visit: Payer: Self-pay | Admitting: Oncology

## 2021-11-25 ENCOUNTER — Inpatient Hospital Stay: Payer: Medicaid Other | Attending: Nurse Practitioner

## 2021-11-25 ENCOUNTER — Other Ambulatory Visit: Payer: Self-pay

## 2021-11-25 ENCOUNTER — Inpatient Hospital Stay (HOSPITAL_BASED_OUTPATIENT_CLINIC_OR_DEPARTMENT_OTHER): Payer: Medicaid Other | Admitting: Oncology

## 2021-11-25 ENCOUNTER — Encounter: Payer: Self-pay | Admitting: Oncology

## 2021-11-25 ENCOUNTER — Other Ambulatory Visit (HOSPITAL_BASED_OUTPATIENT_CLINIC_OR_DEPARTMENT_OTHER): Payer: Self-pay

## 2021-11-25 ENCOUNTER — Inpatient Hospital Stay: Payer: Medicaid Other

## 2021-11-25 ENCOUNTER — Telehealth: Payer: Self-pay

## 2021-11-25 VITALS — HR 99

## 2021-11-25 VITALS — BP 152/88 | HR 105 | Temp 97.9°F | Resp 18 | Ht 64.0 in | Wt 128.6 lb

## 2021-11-25 DIAGNOSIS — C7971 Secondary malignant neoplasm of right adrenal gland: Secondary | ICD-10-CM | POA: Insufficient documentation

## 2021-11-25 DIAGNOSIS — C3412 Malignant neoplasm of upper lobe, left bronchus or lung: Secondary | ICD-10-CM | POA: Insufficient documentation

## 2021-11-25 DIAGNOSIS — Z79899 Other long term (current) drug therapy: Secondary | ICD-10-CM | POA: Diagnosis not present

## 2021-11-25 DIAGNOSIS — Z5112 Encounter for antineoplastic immunotherapy: Secondary | ICD-10-CM | POA: Insufficient documentation

## 2021-11-25 DIAGNOSIS — C7901 Secondary malignant neoplasm of right kidney and renal pelvis: Secondary | ICD-10-CM | POA: Insufficient documentation

## 2021-11-25 DIAGNOSIS — C799 Secondary malignant neoplasm of unspecified site: Secondary | ICD-10-CM

## 2021-11-25 DIAGNOSIS — C3492 Malignant neoplasm of unspecified part of left bronchus or lung: Secondary | ICD-10-CM

## 2021-11-25 DIAGNOSIS — C787 Secondary malignant neoplasm of liver and intrahepatic bile duct: Secondary | ICD-10-CM | POA: Insufficient documentation

## 2021-11-25 DIAGNOSIS — D649 Anemia, unspecified: Secondary | ICD-10-CM | POA: Insufficient documentation

## 2021-11-25 LAB — CBC WITH DIFFERENTIAL (CANCER CENTER ONLY)
Abs Immature Granulocytes: 0.02 10*3/uL (ref 0.00–0.07)
Basophils Absolute: 0.1 10*3/uL (ref 0.0–0.1)
Basophils Relative: 1 %
Eosinophils Absolute: 0.3 10*3/uL (ref 0.0–0.5)
Eosinophils Relative: 5 %
HCT: 46.2 % — ABNORMAL HIGH (ref 36.0–46.0)
Hemoglobin: 15.3 g/dL — ABNORMAL HIGH (ref 12.0–15.0)
Immature Granulocytes: 0 %
Lymphocytes Relative: 32 %
Lymphs Abs: 2.1 10*3/uL (ref 0.7–4.0)
MCH: 30.5 pg (ref 26.0–34.0)
MCHC: 33.1 g/dL (ref 30.0–36.0)
MCV: 92.2 fL (ref 80.0–100.0)
Monocytes Absolute: 0.5 10*3/uL (ref 0.1–1.0)
Monocytes Relative: 8 %
Neutro Abs: 3.5 10*3/uL (ref 1.7–7.7)
Neutrophils Relative %: 54 %
Platelet Count: 268 10*3/uL (ref 150–400)
RBC: 5.01 MIL/uL (ref 3.87–5.11)
RDW: 14 % (ref 11.5–15.5)
WBC Count: 6.5 10*3/uL (ref 4.0–10.5)
nRBC: 0 % (ref 0.0–0.2)

## 2021-11-25 LAB — CMP (CANCER CENTER ONLY)
ALT: 8 U/L (ref 0–44)
AST: 13 U/L — ABNORMAL LOW (ref 15–41)
Albumin: 4.4 g/dL (ref 3.5–5.0)
Alkaline Phosphatase: 97 U/L (ref 38–126)
Anion gap: 9 (ref 5–15)
BUN: 12 mg/dL (ref 8–23)
CO2: 25 mmol/L (ref 22–32)
Calcium: 10.7 mg/dL — ABNORMAL HIGH (ref 8.9–10.3)
Chloride: 103 mmol/L (ref 98–111)
Creatinine: 1.1 mg/dL — ABNORMAL HIGH (ref 0.44–1.00)
GFR, Estimated: 57 mL/min — ABNORMAL LOW (ref >60)
Glucose, Bld: 102 mg/dL — ABNORMAL HIGH (ref 70–99)
Potassium: 4.1 mmol/L (ref 3.5–5.1)
Sodium: 137 mmol/L (ref 135–145)
Total Bilirubin: 0.4 mg/dL (ref 0.3–1.2)
Total Protein: 7.1 g/dL (ref 6.5–8.1)

## 2021-11-25 LAB — TSH: TSH: 3.511 u[IU]/mL (ref 0.350–4.500)

## 2021-11-25 MED ORDER — TRAMADOL HCL 50 MG PO TABS
ORAL_TABLET | ORAL | 0 refills | Status: DC
  Filled 2021-11-25: qty 34, 34d supply, fill #0
  Filled 2022-02-12: qty 7, 7d supply, fill #1

## 2021-11-25 MED ORDER — SODIUM CHLORIDE 0.9 % IV SOLN: Freq: Once | INTRAVENOUS | Status: AC

## 2021-11-25 MED ORDER — SODIUM CHLORIDE 0.9 % IV SOLN
400.0000 mg | Freq: Once | INTRAVENOUS | Status: AC
  Administered 2021-11-25: 400 mg via INTRAVENOUS
  Filled 2021-11-25: qty 16

## 2021-11-25 MED ORDER — TEMAZEPAM 15 MG PO CAPS
15.0000 mg | ORAL_CAPSULE | Freq: Every evening | ORAL | 2 refills | Status: AC | PRN
  Filled 2021-11-25: qty 30, 30d supply, fill #0
  Filled 2022-02-12: qty 30, 30d supply, fill #1

## 2021-11-25 NOTE — Progress Notes (Signed)
Patient presents for treatment. RN assessment completed along with the following:  Labs/vitals reviewed - Yes, and within treatment parameters.   Weight within 10% of previous measurement - Yes Oncology Treatment Attestation completed for current therapy- Yes, on date 06/20/2019 Informed consent completed and reflects current therapy/intent - Yes, on date 06/29/2019             Provider progress note reviewed - Yes, today's provider note was reviewed. Treatment/Antibody/Supportive plan reviewed - Yes, and there are no adjustments needed for today's treatment. S&H and other orders reviewed - Yes, and there are no additional orders identified. Previous treatment date reviewed - Yes, and the appropriate amount of time has elapsed between treatments. Clinic Hand Off Received from - Yes, Karen-K, LPN  Patient to proceed with treatment.

## 2021-11-25 NOTE — Telephone Encounter (Signed)
Patient seen by Dr. Sherrill today ? ?Vitals are within treatment parameters. ? ?Labs reviewed by Dr. Sherrill and are within treatment parameters. ? ?Per physician team, patient is ready for treatment and there are NO modifications to the treatment plan.  ?

## 2021-11-25 NOTE — Patient Instructions (Signed)
[facility]   Discharge Instructions: Thank you for choosing [facility] to provide your oncology and hematology care.   If you have a lab appointment with the [facility], please go directly to the [facility] and check in at the registration area.   Wear comfortable clothing and clothing appropriate for easy access to any Portacath or PICC line.   We strive to give you quality time with your provider. You may need to reschedule your appointment if you arrive late (15 or more minutes).  Arriving late affects you and other patients whose appointments are after yours.  Also, if you miss three or more appointments without notifying the office, you may be dismissed from the clinic at the provider's discretion.      For prescription refill requests, have your pharmacy contact our office and allow 72 hours for refills to be completed.    Today you received the following chemotherapy and/or immunotherapy agents Pembrolizumab (KEYTRUDA).      To help prevent nausea and vomiting after your treatment, we encourage you to take your nausea medication as directed.  BELOW ARE SYMPTOMS THAT SHOULD BE REPORTED IMMEDIATELY: *FEVER GREATER THAN 100.4 F (38 C) OR HIGHER *CHILLS OR SWEATING *NAUSEA AND VOMITING THAT IS NOT CONTROLLED WITH YOUR NAUSEA MEDICATION *UNUSUAL SHORTNESS OF BREATH *UNUSUAL BRUISING OR BLEEDING *URINARY PROBLEMS (pain or burning when urinating, or frequent urination) *BOWEL PROBLEMS (unusual diarrhea, constipation, pain near the anus) TENDERNESS IN MOUTH AND THROAT WITH OR WITHOUT PRESENCE OF ULCERS (sore throat, sores in mouth, or a toothache) UNUSUAL RASH, SWELLING OR PAIN  UNUSUAL VAGINAL DISCHARGE OR ITCHING   Items with * indicate a potential emergency and should be followed up as soon as possible or go to the Emergency Department if any problems should occur.  Please show the CHEMOTHERAPY ALERT CARD or IMMUNOTHERAPY ALERT CARD  at check-in to the Emergency Department and triage nurse.  Should you have questions after your visit or need to cancel or reschedule your appointment, please contact [facility]  Dept: [phone]  and follow the prompts.  Office hours are 8:00 a.m. to 4:30 p.m. Monday - Friday. Please note that voicemails left after 4:00 p.m. may not be returned until the following business day.  We are closed weekends and major holidays. You have access to a nurse at all times for urgent questions. Please call the main number to the clinic Dept: [phone] and follow the prompts.   For any non-urgent questions, you may also contact your provider using MyChart. We now offer e-Visits for anyone 31 and older to request care online for non-urgent symptoms. For details visit [email or web address].   Also download the MyChart app! Go to the app store, search "MyChart", open the app, select [facility], and log in with your MyChart username and password.  Due to Covid, a mask is required upon entering the hospital/clinic. If you do not have a mask, one will be given to you upon arrival. For doctor visits, patients may have 1 support person aged 15 or older with them. For treatment visits, patients cannot have anyone with them due to current Covid guidelines and our immunocompromised population.   Pembrolizumab injection What is this medication? PEMBROLIZUMAB (pem broe liz ue mab) is a monoclonal antibody. It is used to treat certain types of cancer. This medicine may be used for other purposes; ask your health care provider or pharmacist if you have questions. COMMON BRAND NAME(S):  Keytruda What should I tell my care team before I take this medication? They need to know if you have any of these conditions: autoimmune diseases like Crohn's disease, ulcerative colitis, or lupus have had or planning to have an allogeneic stem cell transplant (uses someone else's stem cells) history of  organ transplant history of chest radiation nervous system problems like myasthenia gravis or Guillain-Barre syndrome an unusual or allergic reaction to pembrolizumab, other medicines, foods, dyes, or preservatives pregnant or trying to get pregnant breast-feeding How should I use this medication? This medicine is for infusion into a vein. It is given by a health care professional in a hospital or clinic setting. A special MedGuide will be given to you before each treatment. Be sure to read this information carefully each time. Talk to your pediatrician regarding the use of this medicine in children. While this drug may be prescribed for children as young as 6 months for selected conditions, precautions do apply. Overdosage: If you think you have taken too much of this medicine contact a poison control center or emergency room at once. NOTE: This medicine is only for you. Do not share this medicine with others. What if I miss a dose? It is important not to miss your dose. Call your doctor or health care professional if you are unable to keep an appointment. What may interact with this medication? Interactions have not been studied. This list may not describe all possible interactions. Give your health care provider a list of all the medicines, herbs, non-prescription drugs, or dietary supplements you use. Also tell them if you smoke, drink alcohol, or use illegal drugs. Some items may interact with your medicine. What should I watch for while using this medication? Your condition will be monitored carefully while you are receiving this medicine. You may need blood work done while you are taking this medicine. Do not become pregnant while taking this medicine or for 4 months after stopping it. Women should inform their doctor if they wish to become pregnant or think they might be pregnant. There is a potential for serious side effects to an unborn child. Talk to your health care professional or  pharmacist for more information. Do not breast-feed an infant while taking this medicine or for 4 months after the last dose. What side effects may I notice from receiving this medication? Side effects that you should report to your doctor or health care professional as soon as possible: allergic reactions like skin rash, itching or hives, swelling of the face, lips, or tongue bloody or black, tarry breathing problems changes in vision chest pain chills confusion constipation cough diarrhea dizziness or feeling faint or lightheaded fast or irregular heartbeat fever flushing joint pain low blood counts - this medicine may decrease the number of white blood cells, red blood cells and platelets. You may be at increased risk for infections and bleeding. muscle pain muscle weakness pain, tingling, numbness in the hands or feet persistent headache redness, blistering, peeling or loosening of the skin, including inside the mouth signs and symptoms of high blood sugar such as dizziness; dry mouth; dry skin; fruity breath; nausea; stomach pain; increased hunger or thirst; increased urination signs and symptoms of kidney injury like trouble passing urine or change in the amount of urine signs and symptoms of liver injury like dark urine, light-colored stools, loss of appetite, nausea, right upper belly pain, yellowing of the eyes or skin sweating swollen lymph nodes weight loss Side effects that usually do not  require medical attention (report to your doctor or health care professional if they continue or are bothersome): decreased appetite hair loss tiredness This list may not describe all possible side effects. Call your doctor for medical advice about side effects. You may report side effects to FDA at 1-800-FDA-1088. Where should I keep my medication? This drug is given in a hospital or clinic and will not be stored at home. NOTE: This sheet is a summary. It may not cover all possible  information. If you have questions about this medicine, talk to your doctor, pharmacist, or health care provider.  2022 Elsevier/Gold Standard (2021-09-03 00:00:00)

## 2021-11-25 NOTE — Progress Notes (Signed)
[facility] OFFICE PROGRESS NOTE   Diagnosis: Non-small cell lung cancer  INTERVAL HISTORY:   Barbara Watkins returns as scheduled.  She was last treated with pembrolizumab on 10/14/2021.  No rash or diarrhea.  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  She reports stress due to family issues.  She requests a medication for sleep.  Objective:  Vital signs in last 24 hours:  Blood pressure (!) 152/88, pulse (!) 105, temperature 97.9 F (36.6 C), temperature source Oral, resp. rate 18, height _0  (1.626 m), weight 128 lb 9.6 oz (58.3 kg), SpO2 98 %.    Lymphatics: No cervical, supraclavicular, or axillary nodes Resp: Lungs clear bilaterally Cardio: Regular rate and rhythm GI: No hepatosplenomegaly Vascular: No leg edema   Lab Results:  Lab Results  Component Value Date   WBC 6.5 11/25/2021   HGB 15.3 (H) 11/25/2021   HCT 46.2 (H) 11/25/2021   MCV 92.2 11/25/2021   PLT 268 11/25/2021   NEUTROABS 3.5 11/25/2021    CMP  Lab Results  Component Value Date   NA 137 11/25/2021   K 4.1 11/25/2021   CL 103 11/25/2021   CO2 25 11/25/2021   GLUCOSE 102 (H) 11/25/2021   BUN 12 11/25/2021   CREATININE 1.10 (H) 11/25/2021   CALCIUM 10.7 (H) 11/25/2021   PROT 7.1 11/25/2021   ALBUMIN 4.4 11/25/2021   AST 13 (L) 11/25/2021   ALT 8 11/25/2021   ALKPHOS 97 11/25/2021   BILITOT 0.4 11/25/2021   GFRNONAA 57 (L) 11/25/2021   GFRAA 52 (L) 09/24/2020   Medications: I have reviewed the patient's current medications.   Assessment/Plan:  1. Metastatic non-small cell lung cancer  CT abdomen/pelvis 06/12/2019-scattered soft tissue masses in left hepatic lobe, right kidney, and the right adrenal gland.  Fullness of the left hilum suspicious for left hilar mass and small nodules at the right lung base.  Also noted signs of right pyelonephritis.   CT chest 06/13/2019- large encasing soft tissue mass left hilum measuring at least 5.8 x 4.5 x 3.8  cm with narrowing of the left mainstem bronchus and left pulmonary artery.  Near-total obstructive atelectasis of the left upper lobe with a portion of the lingula remaining aerated.  Multiple bilateral pulmonary nodules. Biopsy left supraclavicular lymph node 06/15/2019- metastatic lung adenocarcinoma; immunohistochemistry positive for cytokeratin 7, TTF-1, PAX 8 (weak).  Napsin-A and CDX-2 negative.  Foundation 1- microsatellite stable, tumor mutational burden 15, ATM alteration, KRAS G13D;  tumor proportion score 100% Brain CT 06/29/2019- negative for metastatic disease Cycle 1 carboplatin/Alimta/pembrolizumab 06/29/2019 Cycle 2 carboplatin/Alimta/pembrolizumab 07/20/2019 Cycle 3 carboplatin/Alimta 08/10/2019, pembrolizumab held due to rash Cycle 4 carboplatin/Alimta, Udenyca added, pembrolizumab held 08/31/2019 Cycle 5 Alimta alone (carboplatin discontinued, pembrolizumab held) 09/22/2019 CTs 10/06/2019-decrease in size of central left upper lobe pulmonary mass; bilateral pulmonary nodules of varying size some of which are irregular in shape; many of these are stable in the interval.  Some of the right lung nodules have decreased/resolved since the prior study.  Some new irregular nodules in the left lower lobe indeterminate but potentially infectious/inflammatory.  Marked interval decrease in size of left hepatic and right renal metastases.  Metastatic retroperitoneal lymphadenopathy in the abdomen has clearly decreased.  Metastatic nodules in the right omentum and perirenal fat bilaterally have decreased/resolved.  No new or progressive findings. Cycle 6 Alimta/pembrolizumab 10/17/2019 Cycle 7 Alimta/pembrolizumab 11/08/2019 Cycle 8 Alimta/pembrolizumab  Cycle 9 Pembrolizumab 12/19/2019 (Alimta held due to elevated creatinine) Cycle  10 Pembrolizumab 01/10/2020 (Alimta held due to borderline creatinine) CTs 01/26/2020-decreased left hilar soft tissue, persistent mass effect on the left upper lobe pulmonary  artery, medial left upper lobe pulmonary lesion has decreased in size with further reexpansion of the left upper lobe, 5 mm right upper lobe nodule more conspicuous, new spiculated nodular densities in the left lower lobe on previous exam have almost completely resolved, decreased left hepatic lesion, decreased right renal lesion, resolved right adrenal lesion decreased retroperitoneal lymphadenopathy Cycle 11 Alimta/pembrolizumab 01/31/2020 Cycle 12 Alimta/pembrolizumab 02/21/2020 Cycle 13 Alimta/pembrolizumab 03/13/2020, Aloxi added for delayed nausea Cycle 14 Alimta/pembrolizumab 04/02/2020, Aloxi plus Emend Cycle 15 pembrolizumab alone 04/27/2020 (Alimta held due to elevated creatinine) Cycle 16 pembrolizumab alone 05/18/2020 CTs 06/05/2020-stable exam.  No significant change in size of medial left upper lobe lung mass.  Unchanged appearance of left hepatic lobe metastasis.  Slight decrease in size of right kidney lesion.  Small pulmonary nodules stable to minimally decreased.  No new or progressive disease. Cycle 17 pembrolizumab 06/06/2020 (6-week dosing) Cycle 18 Pembrolizumab 08/13/2020 (6-week dosing) Cycle 19 pembrolizumab 09/24/2020 (6-week dosing) Cycle 20 Pembrolizumab 11/05/2020 (6-week dosing) CTs 12/13/2020-slight decrease in left hepatic lobe metastases, stable appearance of left upper lung lesion, resolution of tiny peripheral right upper lobe nodule, scattered stable pulmonary nodules, new 6 mm left apical nodule Cycle 21 pembrolizumab 12/17/2020 (6-week dosing) Cycle 22 Pembrolizumab 01/28/2021 Cycle 23 pembrolizumab 03/11/2021 Cycle 24 Pembrolizumab 04/23/2021 CTs 05/31/2021- stable posttreatment changes involving the left upper lobe medially with 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  No findings to suggest recurrent tumor.  Stable scattered pulmonary nodules and groundglass opacities in both lungs.  Stable 5.0 x 4.7 cm partially enhancing necrotic left hepatic lobe lesion.  No new hepatic  metastatic disease.  Stable scarring changes involving the upper pole region of the right kidney with adjacent persistent masslike lesion measuring 19 mm. Cycle 25 Pembrolizumab 06/04/2021 Cycle 26 pembrolizumab 07/16/2021 Cycle 27 Pembrolizumab 08/27/2021 Cycle 28 Pembrolizumab 10/14/2021 Cycle 29 pembrolizumab 11/25/2021 2. Anemia 3. Urinary tract infection 4. 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 Nausea 6. Constipation 7.  Right abdominal pain secondary to #1 8.  Skin rash bilateral forearms, grade 2, likely related to pembrolizumab 07/20/2019; grade 3 08/03/2019, prednisone initiated; rash improved 08/10/2019, prednisone decreased to 10 mg daily x7 days then discontinued 9.  Nodular right lip lesion 08/13/2020, referred to dermatology 10.  Hypercalcemia-mild chronic elevation, normal PTH level 06/05/2020    Disposition: Barbara Watkins appears stable.  She will complete another treatment pembrolizumab today.  We discussed a treatment break.  She will be scheduled for a restaging CT evaluation in early January.  The plan is to discontinue pembrolizumab if there is no evidence of disease progression.  I refilled her prescription of tramadol.  She will try Restoril as needed for insomnia.  Betsy Coder, MD  11/25/2021  12:29 PM

## 2021-12-15 ENCOUNTER — Other Ambulatory Visit: Payer: Self-pay

## 2021-12-27 ENCOUNTER — Encounter: Payer: Self-pay | Admitting: Oncology

## 2021-12-30 ENCOUNTER — Other Ambulatory Visit: Payer: Self-pay | Admitting: Oncology

## 2022-01-03 ENCOUNTER — Ambulatory Visit (HOSPITAL_BASED_OUTPATIENT_CLINIC_OR_DEPARTMENT_OTHER): Admission: RE | Admit: 2022-01-03 | Payer: Medicare Other | Source: Ambulatory Visit

## 2022-01-03 ENCOUNTER — Inpatient Hospital Stay: Payer: Medicare Other | Attending: Nurse Practitioner

## 2022-01-03 DIAGNOSIS — Z5112 Encounter for antineoplastic immunotherapy: Secondary | ICD-10-CM | POA: Insufficient documentation

## 2022-01-03 DIAGNOSIS — Z79899 Other long term (current) drug therapy: Secondary | ICD-10-CM | POA: Insufficient documentation

## 2022-01-03 DIAGNOSIS — C787 Secondary malignant neoplasm of liver and intrahepatic bile duct: Secondary | ICD-10-CM | POA: Insufficient documentation

## 2022-01-03 DIAGNOSIS — C7902 Secondary malignant neoplasm of left kidney and renal pelvis: Secondary | ICD-10-CM | POA: Insufficient documentation

## 2022-01-03 DIAGNOSIS — C3412 Malignant neoplasm of upper lobe, left bronchus or lung: Secondary | ICD-10-CM | POA: Insufficient documentation

## 2022-01-03 DIAGNOSIS — D649 Anemia, unspecified: Secondary | ICD-10-CM | POA: Insufficient documentation

## 2022-01-03 DIAGNOSIS — N390 Urinary tract infection, site not specified: Secondary | ICD-10-CM | POA: Insufficient documentation

## 2022-01-03 DIAGNOSIS — K5900 Constipation, unspecified: Secondary | ICD-10-CM | POA: Insufficient documentation

## 2022-01-06 ENCOUNTER — Inpatient Hospital Stay (HOSPITAL_BASED_OUTPATIENT_CLINIC_OR_DEPARTMENT_OTHER): Payer: Medicare Other | Admitting: Oncology

## 2022-01-06 ENCOUNTER — Other Ambulatory Visit: Payer: Self-pay

## 2022-01-06 ENCOUNTER — Encounter: Payer: Self-pay | Admitting: Oncology

## 2022-01-06 ENCOUNTER — Telehealth: Payer: Self-pay

## 2022-01-06 ENCOUNTER — Inpatient Hospital Stay: Payer: Medicare Other

## 2022-01-06 VITALS — BP 127/66 | HR 75

## 2022-01-06 VITALS — BP 160/90 | HR 98 | Temp 97.8°F | Resp 18 | Ht 64.0 in | Wt 131.6 lb

## 2022-01-06 DIAGNOSIS — C7902 Secondary malignant neoplasm of left kidney and renal pelvis: Secondary | ICD-10-CM | POA: Diagnosis not present

## 2022-01-06 DIAGNOSIS — K5900 Constipation, unspecified: Secondary | ICD-10-CM | POA: Diagnosis not present

## 2022-01-06 DIAGNOSIS — C787 Secondary malignant neoplasm of liver and intrahepatic bile duct: Secondary | ICD-10-CM | POA: Diagnosis not present

## 2022-01-06 DIAGNOSIS — Z79899 Other long term (current) drug therapy: Secondary | ICD-10-CM | POA: Diagnosis not present

## 2022-01-06 DIAGNOSIS — C3492 Malignant neoplasm of unspecified part of left bronchus or lung: Secondary | ICD-10-CM

## 2022-01-06 DIAGNOSIS — C3412 Malignant neoplasm of upper lobe, left bronchus or lung: Secondary | ICD-10-CM | POA: Diagnosis present

## 2022-01-06 DIAGNOSIS — D649 Anemia, unspecified: Secondary | ICD-10-CM | POA: Diagnosis not present

## 2022-01-06 DIAGNOSIS — Z5112 Encounter for antineoplastic immunotherapy: Secondary | ICD-10-CM | POA: Diagnosis present

## 2022-01-06 DIAGNOSIS — C799 Secondary malignant neoplasm of unspecified site: Secondary | ICD-10-CM

## 2022-01-06 DIAGNOSIS — N390 Urinary tract infection, site not specified: Secondary | ICD-10-CM | POA: Diagnosis not present

## 2022-01-06 LAB — CMP (CANCER CENTER ONLY)
ALT: 8 U/L (ref 0–44)
AST: 14 U/L — ABNORMAL LOW (ref 15–41)
Albumin: 4.3 g/dL (ref 3.5–5.0)
Alkaline Phosphatase: 79 U/L (ref 38–126)
Anion gap: 9 (ref 5–15)
BUN: 13 mg/dL (ref 8–23)
CO2: 21 mmol/L — ABNORMAL LOW (ref 22–32)
Calcium: 10.2 mg/dL (ref 8.9–10.3)
Chloride: 108 mmol/L (ref 98–111)
Creatinine: 1.2 mg/dL — ABNORMAL HIGH (ref 0.44–1.00)
GFR, Estimated: 52 mL/min — ABNORMAL LOW (ref >60)
Glucose, Bld: 78 mg/dL (ref 70–99)
Potassium: 4.3 mmol/L (ref 3.5–5.1)
Sodium: 138 mmol/L (ref 135–145)
Total Bilirubin: 0.5 mg/dL (ref 0.3–1.2)
Total Protein: 7 g/dL (ref 6.5–8.1)

## 2022-01-06 MED ORDER — SODIUM CHLORIDE 0.9 % IV SOLN: Freq: Once | INTRAVENOUS | Status: AC

## 2022-01-06 MED ORDER — SODIUM CHLORIDE 0.9 % IV SOLN
400.0000 mg | Freq: Once | INTRAVENOUS | Status: AC
  Administered 2022-01-06: 400 mg via INTRAVENOUS
  Filled 2022-01-06: qty 16

## 2022-01-06 NOTE — Progress Notes (Signed)
Patient presents for treatment. RN assessment completed along with the following:  Labs/vitals reviewed -  ok to treat with only CMP per Dr Benay Spice in [facility], LPN note    Weight within 10% of previous measurement - Yes Oncology Treatment Attestation completed for current therapy- Yes, on date / 06/20/19 Informed consent completed and reflects /current therapy/intent -  Yes, on date 06/29/19             Provider progress note reviewed - Yes, today's provider note was reviewed. Treatment/Antibody/Supportive plan reviewed - Yes, and there are no adjustments needed for today's treatment. S&H and other orders reviewed - Yes, and there are no additional orders identified. Previous treatment date reviewed - Yes, and the appropriate amount of time has elapsed between treatments. Clinic Hand Off Received from - Danae [facility], LPN  Patient to proceed with treatment.

## 2022-01-06 NOTE — Telephone Encounter (Addendum)
Patient seen by Dr. Benay Spice today  Vitals are within treatment parameters.  Labs reviewed by Dr. Benay Spice Per Stevphen Meuse to treat without CBC, CMP ordered  Per physician team, patient is ready for treatment and there are NO modifications to the treatment plan.

## 2022-01-06 NOTE — Patient Instructions (Signed)
[facility]   Discharge Instructions: Thank you for choosing [facility] to provide your oncology and hematology care.   If you have a lab appointment with the [facility], please go directly to the [facility] and check in at the registration area.   Wear comfortable clothing and clothing appropriate for easy access to any Portacath or PICC line.   We strive to give you quality time with your provider. You may need to reschedule your appointment if you arrive late (15 or more minutes).  Arriving late affects you and other patients whose appointments are after yours.  Also, if you miss three or more appointments without notifying the office, you may be dismissed from the clinic at the providers discretion.      For prescription refill requests, have your pharmacy contact our office and allow 72 hours for refills to be completed.    Today you received the following chemotherapy and/or immunotherapy agents Keytruda      To help prevent nausea and vomiting after your treatment, we encourage you to take your nausea medication as directed.  BELOW ARE SYMPTOMS THAT SHOULD BE REPORTED IMMEDIATELY: *FEVER GREATER THAN 100.4 F (38 C) OR HIGHER *CHILLS OR SWEATING *NAUSEA AND VOMITING THAT IS NOT CONTROLLED WITH YOUR NAUSEA MEDICATION *UNUSUAL SHORTNESS OF BREATH *UNUSUAL BRUISING OR BLEEDING *URINARY PROBLEMS (pain or burning when urinating, or frequent urination) *BOWEL PROBLEMS (unusual diarrhea, constipation, pain near the anus) TENDERNESS IN MOUTH AND THROAT WITH OR WITHOUT PRESENCE OF ULCERS (sore throat, sores in mouth, or a toothache) UNUSUAL RASH, SWELLING OR PAIN  UNUSUAL VAGINAL DISCHARGE OR ITCHING   Items with * indicate a potential emergency and should be followed up as soon as possible or go to the Emergency Department if any problems should occur.  Please show the CHEMOTHERAPY ALERT CARD or IMMUNOTHERAPY ALERT CARD at check-in to the  Emergency Department and triage nurse.  Should you have questions after your visit or need to cancel or reschedule your appointment, please contact [facility]  Dept: [phone]  and follow the prompts.  Office hours are 8:00 a.m. to 4:30 p.m. Monday - Friday. Please note that voicemails left after 4:00 p.m. may not be returned until the following business day.  We are closed weekends and major holidays. You have access to a nurse at all times for urgent questions. Please call the main number to the clinic Dept: [phone] and follow the prompts.   For any non-urgent questions, you may also contact your provider using MyChart. We now offer e-Visits for anyone 50 and older to request care online for non-urgent symptoms. For details visit [email or web address].   Also download the MyChart app! Go to the app store, search "MyChart", open the app, select [facility], and log in with your MyChart username and password.  Due to Covid, a mask is required upon entering the hospital/clinic. If you do not have a mask, one will be given to you upon arrival. For doctor visits, patients may have 1 support person aged 45 or older with them. For treatment visits, patients cannot have anyone with them due to current Covid guidelines and our immunocompromised population.   Pembrolizumab injection What is this medication? PEMBROLIZUMAB (pem broe liz ue mab) is a monoclonal antibody. It is used to treat certain types of cancer. This medicine may be used for other purposes; ask your health care provider or pharmacist if you have questions. COMMON BRAND NAME(S): [employer]  What should I tell my care team before I take this medication? They need to know if you have any of these conditions: autoimmune diseases like Crohn's disease, ulcerative colitis, or lupus have had or planning to have an allogeneic stem cell transplant (uses someone else's stem cells) history of organ  transplant history of chest radiation nervous system problems like myasthenia gravis or Guillain-Barre syndrome an unusual or allergic reaction to pembrolizumab, other medicines, foods, dyes, or preservatives pregnant or trying to get pregnant breast-feeding How should I use this medication? This medicine is for infusion into a vein. It is given by a health care professional in a hospital or clinic setting. A special MedGuide will be given to you before each treatment. Be sure to read this information carefully each time. Talk to your pediatrician regarding the use of this medicine in children. While this drug may be prescribed for children as young as 6 months for selected conditions, precautions do apply. Overdosage: If you think you have taken too much of this medicine contact a poison control center or emergency room at once. NOTE: This medicine is only for you. Do not share this medicine with others. What if I miss a dose? It is important not to miss your dose. Call your doctor or health care professional if you are unable to keep an appointment. What may interact with this medication? Interactions have not been studied. This list may not describe all possible interactions. Give your health care provider a list of all the medicines, herbs, non-prescription drugs, or dietary supplements you use. Also tell them if you smoke, drink alcohol, or use illegal drugs. Some items may interact with your medicine. What should I watch for while using this medication? Your condition will be monitored carefully while you are receiving this medicine. You may need blood work done while you are taking this medicine. Do not become pregnant while taking this medicine or for 4 months after stopping it. Women should inform their doctor if they wish to become pregnant or think they might be pregnant. There is a potential for serious side effects to an unborn child. Talk to your health care professional or  pharmacist for more information. Do not breast-feed an infant while taking this medicine or for 4 months after the last dose. What side effects may I notice from receiving this medication? Side effects that you should report to your doctor or health care professional as soon as possible: allergic reactions like skin rash, itching or hives, swelling of the face, lips, or tongue bloody or black, tarry breathing problems changes in vision chest pain chills confusion constipation cough diarrhea dizziness or feeling faint or lightheaded fast or irregular heartbeat fever flushing joint pain low blood counts - this medicine may decrease the number of white blood cells, red blood cells and platelets. You may be at increased risk for infections and bleeding. muscle pain muscle weakness pain, tingling, numbness in the hands or feet persistent headache redness, blistering, peeling or loosening of the skin, including inside the mouth signs and symptoms of high blood sugar such as dizziness; dry mouth; dry skin; fruity breath; nausea; stomach pain; increased hunger or thirst; increased urination signs and symptoms of kidney injury like trouble passing urine or change in the amount of urine signs and symptoms of liver injury like dark urine, light-colored stools, loss of appetite, nausea, right upper belly pain, yellowing of the eyes or skin sweating swollen lymph nodes weight loss Side effects that usually do not require  medical attention (report to your doctor or health care professional if they continue or are bothersome): decreased appetite hair loss tiredness This list may not describe all possible side effects. Call your doctor for medical advice about side effects. You may report side effects to FDA at 1-800-FDA-1088. Where should I keep my medication? This drug is given in a hospital or clinic and will not be stored at home. NOTE: This sheet is a summary. It may not cover all possible  information. If you have questions about this medicine, talk to your doctor, pharmacist, or health care provider.  2022 Elsevier/Gold Standard (2021-09-03 00:00:00)

## 2022-01-06 NOTE — Progress Notes (Signed)
[facility] OFFICE PROGRESS NOTE   Diagnosis: Non-small cell lung cancer  INTERVAL HISTORY:   Barbara Watkins completed another treatment with pembrolizumab on 11/25/2021.  No rash or diarrhea.  She feels well.  No complaint.  Stable chronic back pain. She was unable to complete restaging CTs last week due to a lapse in insurance coverage.  This has been resolved.  Objective:  Vital signs in last 24 hours:  Blood pressure (!) 160/90, pulse 98, temperature 97.8 F (36.6 C), temperature source Oral, resp. rate 18, height _0  (1.626 m), weight 131 lb 9.6 oz (59.7 kg), SpO2 100 %.    HEENT: Neck without mass Lymphatics: No cervical, supraclavicular, or axillary nodes Resp: Decreased breath sounds at the right compared to the left chest, no respiratory distress Cardio: Regular rate and rhythm GI: No hepatosplenomegaly Vascular: No leg edema  Lab Results:  Lab Results  Component Value Date   WBC 6.5 11/25/2021   HGB 15.3 (H) 11/25/2021   HCT 46.2 (H) 11/25/2021   MCV 92.2 11/25/2021   PLT 268 11/25/2021   NEUTROABS 3.5 11/25/2021    CMP  Lab Results  Component Value Date   NA 137 11/25/2021   K 4.1 11/25/2021   CL 103 11/25/2021   CO2 25 11/25/2021   GLUCOSE 102 (H) 11/25/2021   BUN 12 11/25/2021   CREATININE 1.10 (H) 11/25/2021   CALCIUM 10.7 (H) 11/25/2021   PROT 7.1 11/25/2021   ALBUMIN 4.4 11/25/2021   AST 13 (L) 11/25/2021   ALT 8 11/25/2021   ALKPHOS 97 11/25/2021   BILITOT 0.4 11/25/2021   GFRNONAA 57 (L) 11/25/2021   GFRAA 52 (L) 09/24/2020     Medications: I have reviewed the patient's current medications.   Assessment/Plan:  1. Metastatic non-small cell lung cancer  CT abdomen/pelvis 06/12/2019-scattered soft tissue masses in left hepatic lobe, right kidney, and the right adrenal gland.  Fullness of the left hilum suspicious for left hilar mass and small nodules at the right lung base.  Also noted signs of right pyelonephritis.   CT  chest 06/13/2019- large encasing soft tissue mass left hilum measuring at least 5.8 x 4.5 x 3.8 cm with narrowing of the left mainstem bronchus and left pulmonary artery.  Near-total obstructive atelectasis of the left upper lobe with a portion of the lingula remaining aerated.  Multiple bilateral pulmonary nodules. Biopsy left supraclavicular lymph node 06/15/2019- metastatic lung adenocarcinoma; immunohistochemistry positive for cytokeratin 7, TTF-1, PAX 8 (weak).  Napsin-A and CDX-2 negative.  Foundation 1- microsatellite stable, tumor mutational burden 15, ATM alteration, KRAS G13D;  tumor proportion score 100% Brain CT 06/29/2019- negative for metastatic disease Cycle 1 carboplatin/Alimta/pembrolizumab 06/29/2019 Cycle 2 carboplatin/Alimta/pembrolizumab 07/20/2019 Cycle 3 carboplatin/Alimta 08/10/2019, pembrolizumab held due to rash Cycle 4 carboplatin/Alimta, Udenyca added, pembrolizumab held 08/31/2019 Cycle 5 Alimta alone (carboplatin discontinued, pembrolizumab held) 09/22/2019 CTs 10/06/2019-decrease in size of central left upper lobe pulmonary mass; bilateral pulmonary nodules of varying size some of which are irregular in shape; many of these are stable in the interval.  Some of the right lung nodules have decreased/resolved since the prior study.  Some new irregular nodules in the left lower lobe indeterminate but potentially infectious/inflammatory.  Marked interval decrease in size of left hepatic and right renal metastases.  Metastatic retroperitoneal lymphadenopathy in the abdomen has clearly decreased.  Metastatic nodules in the right omentum and perirenal fat bilaterally have decreased/resolved.  No new or progressive findings. Cycle 6 Alimta/pembrolizumab 10/17/2019 Cycle 7 Alimta/pembrolizumab 11/08/2019  Cycle 8 Alimta/pembrolizumab  Cycle 9 Pembrolizumab 12/19/2019 (Alimta held due to elevated creatinine) Cycle 10 Pembrolizumab 01/10/2020 (Alimta held due to borderline creatinine) CTs  01/26/2020-decreased left hilar soft tissue, persistent mass effect on the left upper lobe pulmonary artery, medial left upper lobe pulmonary lesion has decreased in size with further reexpansion of the left upper lobe, 5 mm right upper lobe nodule more conspicuous, new spiculated nodular densities in the left lower lobe on previous exam have almost completely resolved, decreased left hepatic lesion, decreased right renal lesion, resolved right adrenal lesion decreased retroperitoneal lymphadenopathy Cycle 11 Alimta/pembrolizumab 01/31/2020 Cycle 12 Alimta/pembrolizumab 02/21/2020 Cycle 13 Alimta/pembrolizumab 03/13/2020, Aloxi added for delayed nausea Cycle 14 Alimta/pembrolizumab 04/02/2020, Aloxi plus Emend Cycle 15 pembrolizumab alone 04/27/2020 (Alimta held due to elevated creatinine) Cycle 16 pembrolizumab alone 05/18/2020 CTs 06/05/2020-stable exam.  No significant change in size of medial left upper lobe lung mass.  Unchanged appearance of left hepatic lobe metastasis.  Slight decrease in size of right kidney lesion.  Small pulmonary nodules stable to minimally decreased.  No new or progressive disease. Cycle 17 pembrolizumab 06/06/2020 (6-week dosing) Cycle 18 Pembrolizumab 08/13/2020 (6-week dosing) Cycle 19 pembrolizumab 09/24/2020 (6-week dosing) Cycle 20 Pembrolizumab 11/05/2020 (6-week dosing) CTs 12/13/2020-slight decrease in left hepatic lobe metastases, stable appearance of left upper lung lesion, resolution of tiny peripheral right upper lobe nodule, scattered stable pulmonary nodules, new 6 mm left apical nodule Cycle 21 pembrolizumab 12/17/2020 (6-week dosing) Cycle 22 Pembrolizumab 01/28/2021 Cycle 23 pembrolizumab 03/11/2021 Cycle 24 Pembrolizumab 04/23/2021 CTs 05/31/2021- stable posttreatment changes involving the left upper lobe medially with 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  No findings to suggest recurrent tumor.  Stable scattered pulmonary nodules and groundglass opacities in both  lungs.  Stable 5.0 x 4.7 cm partially enhancing necrotic left hepatic lobe lesion.  No new hepatic metastatic disease.  Stable scarring changes involving the upper pole region of the right kidney with adjacent persistent masslike lesion measuring 19 mm. Cycle 25 Pembrolizumab 06/04/2021 Cycle 26 pembrolizumab 07/16/2021 Cycle 27 Pembrolizumab 08/27/2021 Cycle 28 Pembrolizumab 10/14/2021 Cycle 29 pembrolizumab 11/25/2021 Cycle 30 pembrolizumab 01/06/2022 2. Anemia 3. Urinary tract infection 4. 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 Nausea 6. Constipation 7.  Right abdominal pain secondary to #1 8.  Skin rash bilateral forearms, grade 2, likely related to pembrolizumab 07/20/2019; grade 3 08/03/2019, prednisone initiated; rash improved 08/10/2019, prednisone decreased to 10 mg daily x7 days then discontinued 9.  Nodular right lip lesion 08/13/2020, referred to dermatology 10.  Hypercalcemia-mild chronic elevation, normal PTH level 06/05/2020     Disposition: Barbara Watkins appears unchanged.  She remains in clinical remission from lung cancer.  She was scheduled to have restaging CTs last week, but these were not completed secondary to lack of insurance coverage.  She now has insurance.  She will be scheduled for CTs prior to an office visit in 6 weeks.  She will complete another cycle of pembrolizumab today.  We will consider discontinuing pembrolizumab if the CTs reveal no evidence of persistent or progressive lung cancer.  Betsy Coder, MD  01/06/2022  8:38 AM

## 2022-01-08 ENCOUNTER — Telehealth: Payer: Self-pay | Admitting: *Deleted

## 2022-01-08 NOTE — Telephone Encounter (Signed)
Called patient w/CT appointment on 01/15/22 and she declined. Prefers to wait till February closer to her treatment day.  Rescheduled for 02/13/22 at 1030/1100 at [facility] with oral contrast at 9 am and 10 am. NPO after 0700.  Called back and left VM with all above appointment information. Also left phone number for central scheduling to reschedule if she wishes.

## 2022-01-15 ENCOUNTER — Ambulatory Visit (HOSPITAL_BASED_OUTPATIENT_CLINIC_OR_DEPARTMENT_OTHER): Payer: Medicare Other

## 2022-01-17 ENCOUNTER — Other Ambulatory Visit: Payer: Medicare Other

## 2022-01-17 ENCOUNTER — Ambulatory Visit: Payer: Medicare Other

## 2022-01-17 ENCOUNTER — Ambulatory Visit: Payer: Medicare Other | Admitting: Oncology

## 2022-02-06 ENCOUNTER — Encounter: Payer: Self-pay | Admitting: Oncology

## 2022-02-12 ENCOUNTER — Encounter: Payer: Self-pay | Admitting: Oncology

## 2022-02-12 ENCOUNTER — Other Ambulatory Visit (HOSPITAL_BASED_OUTPATIENT_CLINIC_OR_DEPARTMENT_OTHER): Payer: Self-pay

## 2022-02-13 ENCOUNTER — Other Ambulatory Visit: Payer: Self-pay

## 2022-02-13 ENCOUNTER — Ambulatory Visit (HOSPITAL_BASED_OUTPATIENT_CLINIC_OR_DEPARTMENT_OTHER)
Admission: RE | Admit: 2022-02-13 | Discharge: 2022-02-13 | Disposition: A | Discharge status: Home / Self Care | Payer: Medicare Other | Source: Ambulatory Visit | Attending: Oncology | Admitting: Oncology

## 2022-02-13 ENCOUNTER — Encounter (HOSPITAL_BASED_OUTPATIENT_CLINIC_OR_DEPARTMENT_OTHER): Payer: Self-pay

## 2022-02-13 DIAGNOSIS — J439 Emphysema, unspecified: Secondary | ICD-10-CM | POA: Insufficient documentation

## 2022-02-13 DIAGNOSIS — I2510 Atherosclerotic heart disease of native coronary artery without angina pectoris: Secondary | ICD-10-CM | POA: Insufficient documentation

## 2022-02-13 DIAGNOSIS — C7800 Secondary malignant neoplasm of unspecified lung: Secondary | ICD-10-CM | POA: Diagnosis not present

## 2022-02-13 DIAGNOSIS — C787 Secondary malignant neoplasm of liver and intrahepatic bile duct: Secondary | ICD-10-CM | POA: Insufficient documentation

## 2022-02-13 DIAGNOSIS — C7900 Secondary malignant neoplasm of unspecified kidney and renal pelvis: Secondary | ICD-10-CM | POA: Diagnosis not present

## 2022-02-13 DIAGNOSIS — C799 Secondary malignant neoplasm of unspecified site: Secondary | ICD-10-CM | POA: Insufficient documentation

## 2022-02-13 DIAGNOSIS — I700 Atherosclerosis of aorta: Secondary | ICD-10-CM | POA: Insufficient documentation

## 2022-02-13 DIAGNOSIS — R918 Other nonspecific abnormal finding of lung field: Secondary | ICD-10-CM | POA: Diagnosis not present

## 2022-02-13 DIAGNOSIS — J432 Centrilobular emphysema: Secondary | ICD-10-CM | POA: Diagnosis not present

## 2022-02-13 LAB — POCT I-STAT CREATININE: Creatinine, Ser: 1.4 mg/dL — ABNORMAL HIGH (ref 0.44–1.00)

## 2022-02-13 MED ORDER — IOHEXOL 300 MG/ML  SOLN
85.0000 mL | Freq: Once | INTRAMUSCULAR | Status: AC | PRN
  Administered 2022-02-13: 85 mL via INTRAVENOUS

## 2022-02-14 ENCOUNTER — Other Ambulatory Visit: Payer: Self-pay | Admitting: *Deleted

## 2022-02-14 ENCOUNTER — Other Ambulatory Visit: Payer: Self-pay

## 2022-02-14 ENCOUNTER — Telehealth: Payer: Self-pay

## 2022-02-14 DIAGNOSIS — C3492 Malignant neoplasm of unspecified part of left bronchus or lung: Secondary | ICD-10-CM

## 2022-02-14 NOTE — Telephone Encounter (Signed)
-----   Message from Ladell Pier, MD sent at 02/13/2022  4:53 PM EST ----- Add BMP next visit

## 2022-02-14 NOTE — Progress Notes (Signed)
Added BMP for 02/17/22 at MD request.

## 2022-02-14 NOTE — Telephone Encounter (Signed)
BMP ordered for next visit.

## 2022-02-15 ENCOUNTER — Other Ambulatory Visit: Payer: Self-pay | Admitting: Oncology

## 2022-02-17 ENCOUNTER — Inpatient Hospital Stay: Payer: Medicare Other | Attending: Nurse Practitioner

## 2022-02-17 ENCOUNTER — Inpatient Hospital Stay (HOSPITAL_BASED_OUTPATIENT_CLINIC_OR_DEPARTMENT_OTHER): Payer: Medicare Other | Admitting: Oncology

## 2022-02-17 ENCOUNTER — Inpatient Hospital Stay: Payer: Medicare Other

## 2022-02-17 ENCOUNTER — Other Ambulatory Visit: Payer: Self-pay

## 2022-02-17 VITALS — BP 145/80 | HR 93 | Temp 98.1°F | Resp 18 | Ht 64.0 in | Wt 135.0 lb

## 2022-02-17 DIAGNOSIS — D649 Anemia, unspecified: Secondary | ICD-10-CM | POA: Insufficient documentation

## 2022-02-17 DIAGNOSIS — K5900 Constipation, unspecified: Secondary | ICD-10-CM | POA: Insufficient documentation

## 2022-02-17 DIAGNOSIS — J432 Centrilobular emphysema: Secondary | ICD-10-CM | POA: Insufficient documentation

## 2022-02-17 DIAGNOSIS — K5730 Diverticulosis of large intestine without perforation or abscess without bleeding: Secondary | ICD-10-CM | POA: Diagnosis not present

## 2022-02-17 DIAGNOSIS — C787 Secondary malignant neoplasm of liver and intrahepatic bile duct: Secondary | ICD-10-CM | POA: Insufficient documentation

## 2022-02-17 DIAGNOSIS — I2510 Atherosclerotic heart disease of native coronary artery without angina pectoris: Secondary | ICD-10-CM | POA: Insufficient documentation

## 2022-02-17 DIAGNOSIS — R110 Nausea: Secondary | ICD-10-CM | POA: Insufficient documentation

## 2022-02-17 DIAGNOSIS — Z79899 Other long term (current) drug therapy: Secondary | ICD-10-CM | POA: Diagnosis not present

## 2022-02-17 DIAGNOSIS — R21 Rash and other nonspecific skin eruption: Secondary | ICD-10-CM | POA: Insufficient documentation

## 2022-02-17 DIAGNOSIS — C775 Secondary and unspecified malignant neoplasm of intrapelvic lymph nodes: Secondary | ICD-10-CM | POA: Diagnosis not present

## 2022-02-17 DIAGNOSIS — I700 Atherosclerosis of aorta: Secondary | ICD-10-CM | POA: Insufficient documentation

## 2022-02-17 DIAGNOSIS — J9811 Atelectasis: Secondary | ICD-10-CM | POA: Insufficient documentation

## 2022-02-17 DIAGNOSIS — C3412 Malignant neoplasm of upper lobe, left bronchus or lung: Secondary | ICD-10-CM | POA: Diagnosis not present

## 2022-02-17 DIAGNOSIS — C7902 Secondary malignant neoplasm of left kidney and renal pelvis: Secondary | ICD-10-CM | POA: Insufficient documentation

## 2022-02-17 DIAGNOSIS — C3492 Malignant neoplasm of unspecified part of left bronchus or lung: Secondary | ICD-10-CM | POA: Diagnosis not present

## 2022-02-17 DIAGNOSIS — N390 Urinary tract infection, site not specified: Secondary | ICD-10-CM | POA: Insufficient documentation

## 2022-02-17 LAB — BASIC METABOLIC PANEL - CANCER CENTER ONLY
Anion gap: 9 (ref 5–15)
BUN: 14 mg/dL (ref 8–23)
CO2: 23 mmol/L (ref 22–32)
Calcium: 10 mg/dL (ref 8.9–10.3)
Chloride: 106 mmol/L (ref 98–111)
Creatinine: 1.16 mg/dL — ABNORMAL HIGH (ref 0.44–1.00)
GFR, Estimated: 54 mL/min — ABNORMAL LOW (ref >60)
Glucose, Bld: 87 mg/dL (ref 70–99)
Potassium: 4 mmol/L (ref 3.5–5.1)
Sodium: 138 mmol/L (ref 135–145)

## 2022-02-17 NOTE — Progress Notes (Signed)
[facility] OFFICE PROGRESS NOTE   Diagnosis: Non-small cell lung cancer  INTERVAL HISTORY:   Barbara Watkins returns as scheduled.  She was last treated with pembrolizumab 01/06/2022.  She feels well.  Stable back pain.  No other complaint.  Objective:  Vital signs in last 24 hours:  Blood pressure (!) 145/80, pulse 93, temperature 98.1 F (36.7 C), temperature source Oral, resp. rate 18, height $RemoveBe'5\' 4"'OoVGAlVvj$  (1.626 m), weight 135 lb (61.2 kg), SpO2 100 %.     Lymphatics: No cervical, supraclavicular, or axillary nodes Resp: Lungs clear bilaterally, no respiratory distress Cardio: Regular rate and rhythm GI: No hepatosplenomegaly Vascular: No leg edema    Lab Results:  Lab Results  Component Value Date   WBC 6.5 11/25/2021   HGB 15.3 (H) 11/25/2021   HCT 46.2 (H) 11/25/2021   MCV 92.2 11/25/2021   PLT 268 11/25/2021   NEUTROABS 3.5 11/25/2021    CMP  Lab Results  Component Value Date   NA 138 02/17/2022   K 4.0 02/17/2022   CL 106 02/17/2022   CO2 23 02/17/2022   GLUCOSE 87 02/17/2022   BUN 14 02/17/2022   CREATININE 1.16 (H) 02/17/2022   CALCIUM 10.0 02/17/2022   PROT 7.0 01/06/2022   ALBUMIN 4.3 01/06/2022   AST 14 (L) 01/06/2022   ALT 8 01/06/2022   ALKPHOS 79 01/06/2022   BILITOT 0.5 01/06/2022   GFRNONAA 54 (L) 02/17/2022   GFRAA 52 (L) 09/24/2020    Imaging:  CT CHEST ABDOMEN PELVIS W CONTRAST  Result Date: 02/14/2022 CLINICAL DATA:  Metastatic non-small cell lung cancer restaging EXAM: CT CHEST, ABDOMEN, AND PELVIS WITH CONTRAST TECHNIQUE: Multidetector CT imaging of the chest, abdomen and pelvis was performed following the standard protocol during bolus administration of intravenous contrast. RADIATION DOSE REDUCTION: This exam was performed according to the departmental dose-optimization program which includes automated exposure control, adjustment of the mA and/or kV according to patient size and/or use of iterative reconstruction technique.  CONTRAST:  46mL OMNIPAQUE IOHEXOL 300 MG/ML SOLN, additional oral enteric contrast COMPARISON:  05/31/2021 FINDINGS: CT CHEST FINDINGS Cardiovascular: Aortic atherosclerosis. Normal heart size. Left coronary artery calcifications. No pericardial effusion. Mediastinum/Nodes: No enlarged mediastinal, hilar, or axillary lymph nodes. Thyroid gland, trachea, and esophagus demonstrate no significant findings. Lungs/Pleura: Mild centrilobular and paraseptal emphysema. Unchanged post treatment appearance of the chest with paramedian fibrosis and consolidation of the left upper lobe and lingula (series 4, image 63). Multiple small ground-glass nodules and irregular opacities are unchanged, for example a 1.0 cm ground-glass opacity of the left lower lobe (series 4, image 84). 0.4 cm solid pulmonary nodule of the lateral segment right middle lobe, unchanged (series 4, image 93). No pleural effusion or pneumothorax. Musculoskeletal: No chest wall mass or suspicious osseous lesions identified. CT ABDOMEN PELVIS FINDINGS Hepatobiliary: No significant change in a necrotic, faintly calcified mass of the anterior left lobe of the liver measuring 5.0 x 4.5 cm (series 2, image 57). No gallstones, gallbladder wall thickening, or biliary dilatation. Pancreas: Unremarkable. No pancreatic ductal dilatation or surrounding inflammatory changes. Spleen: Normal in size without significant abnormality. Adrenals/Urinary Tract: Adrenal glands are unremarkable. Unchanged faintly rim calcified mass of the superior pole of the right kidney measuring 1.7 x 1.7 cm (series 2, image 67). The left kidney is normal, without renal calculi, solid lesion, or hydronephrosis. Bladder is unremarkable. Stomach/Bowel: Stomach is within normal limits. Appendix appears normal. No evidence of bowel wall thickening, distention, or inflammatory changes. Sigmoid diverticula. Vascular/Lymphatic: Severe  aortic atherosclerosis. Left common iliac artery stent. No enlarged  abdominal or pelvic lymph nodes. Reproductive: No mass or other abnormality. Other: No abdominal wall hernia or abnormality. No ascites. Musculoskeletal: No acute osseous findings. IMPRESSION: 1. Unchanged post treatment appearance of the chest with paramedian fibrosis and consolidation of the left upper lobe and lingula. No evidence of local malignant recurrence. 2. Multiple small bilateral pulmonary ground-glass nodules and irregular opacities are unchanged, as well as a small pulmonary nodule of the lateral segment right middle lobe. Attention on follow-up. 3. Unchanged treated metastatic lesion of the left lobe of the liver. 4. Unchanged treated metastatic lesion of the superior pole of the right kidney. 5. Emphysema. 6. Coronary artery disease. 7. Severe atherosclerosis with left common iliac artery stent. Aortic Atherosclerosis (ICD10-I70.0) and Emphysema (ICD10-J43.9). Electronically Signed   By: Delanna Ahmadi M.D.   On: 02/14/2022 13:25    Medications: I have reviewed the patient's current medications.   Assessment/Plan: Metastatic non-small cell lung cancer  CT abdomen/pelvis 06/12/2019-scattered soft tissue masses in left hepatic lobe, right kidney, and the right adrenal gland.  Fullness of the left hilum suspicious for left hilar mass and small nodules at the right lung base.  Also noted signs of right pyelonephritis.   CT chest 06/13/2019- large encasing soft tissue mass left hilum measuring at least 5.8 x 4.5 x 3.8 cm with narrowing of the left mainstem bronchus and left pulmonary artery.  Near-total obstructive atelectasis of the left upper lobe with a portion of the lingula remaining aerated.  Multiple bilateral pulmonary nodules. Biopsy left supraclavicular lymph node 06/15/2019- metastatic lung adenocarcinoma; immunohistochemistry positive for cytokeratin 7, TTF-1, PAX 8 (weak).  Napsin-A and CDX-2 negative.  Foundation 1- microsatellite stable, tumor mutational burden 15, ATM alteration, KRAS  G13D;  tumor proportion score 100% Brain CT 06/29/2019- negative for metastatic disease Cycle 1 carboplatin/Alimta/pembrolizumab 06/29/2019 Cycle 2 carboplatin/Alimta/pembrolizumab 07/20/2019 Cycle 3 carboplatin/Alimta 08/10/2019, pembrolizumab held due to rash Cycle 4 carboplatin/Alimta, Udenyca added, pembrolizumab held 08/31/2019 Cycle 5 Alimta alone (carboplatin discontinued, pembrolizumab held) 09/22/2019 CTs 10/06/2019-decrease in size of central left upper lobe pulmonary mass; bilateral pulmonary nodules of varying size some of which are irregular in shape; many of these are stable in the interval.  Some of the right lung nodules have decreased/resolved since the prior study.  Some new irregular nodules in the left lower lobe indeterminate but potentially infectious/inflammatory.  Marked interval decrease in size of left hepatic and right renal metastases.  Metastatic retroperitoneal lymphadenopathy in the abdomen has clearly decreased.  Metastatic nodules in the right omentum and perirenal fat bilaterally have decreased/resolved.  No new or progressive findings. Cycle 6 Alimta/pembrolizumab 10/17/2019 Cycle 7 Alimta/pembrolizumab 11/08/2019 Cycle 8 Alimta/pembrolizumab  Cycle 9 Pembrolizumab 12/19/2019 (Alimta held due to elevated creatinine) Cycle 10 Pembrolizumab 01/10/2020 (Alimta held due to borderline creatinine) CTs 01/26/2020-decreased left hilar soft tissue, persistent mass effect on the left upper lobe pulmonary artery, medial left upper lobe pulmonary lesion has decreased in size with further reexpansion of the left upper lobe, 5 mm right upper lobe nodule more conspicuous, new spiculated nodular densities in the left lower lobe on previous exam have almost completely resolved, decreased left hepatic lesion, decreased right renal lesion, resolved right adrenal lesion decreased retroperitoneal lymphadenopathy Cycle 11 Alimta/pembrolizumab 01/31/2020 Cycle 12 Alimta/pembrolizumab 02/21/2020 Cycle 13  Alimta/pembrolizumab 03/13/2020, Aloxi added for delayed nausea Cycle 14 Alimta/pembrolizumab 04/02/2020, Aloxi plus Emend Cycle 15 pembrolizumab alone 04/27/2020 (Alimta held due to elevated creatinine) Cycle 16 pembrolizumab alone 05/18/2020 CTs 06/05/2020-stable  exam.  No significant change in size of medial left upper lobe lung mass.  Unchanged appearance of left hepatic lobe metastasis.  Slight decrease in size of right kidney lesion.  Small pulmonary nodules stable to minimally decreased.  No new or progressive disease. Cycle 17 pembrolizumab 06/06/2020 (6-week dosing) Cycle 18 Pembrolizumab 08/13/2020 (6-week dosing) Cycle 19 pembrolizumab 09/24/2020 (6-week dosing) Cycle 20 Pembrolizumab 11/05/2020 (6-week dosing) CTs 12/13/2020-slight decrease in left hepatic lobe metastases, stable appearance of left upper lung lesion, resolution of tiny peripheral right upper lobe nodule, scattered stable pulmonary nodules, new 6 mm left apical nodule Cycle 21 pembrolizumab 12/17/2020 (6-week dosing) Cycle 22 Pembrolizumab 01/28/2021 Cycle 23 pembrolizumab 03/11/2021 Cycle 24 Pembrolizumab 04/23/2021 CTs 05/31/2021- stable posttreatment changes involving the left upper lobe medially with 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  No findings to suggest recurrent tumor.  Stable scattered pulmonary nodules and groundglass opacities in both lungs.  Stable 5.0 x 4.7 cm partially enhancing necrotic left hepatic lobe lesion.  No new hepatic metastatic disease.  Stable scarring changes involving the upper pole region of the right kidney with adjacent persistent masslike lesion measuring 19 mm. Cycle 25 Pembrolizumab 06/04/2021 Cycle 26 pembrolizumab 07/16/2021 Cycle 27 Pembrolizumab 08/27/2021 Cycle 28 Pembrolizumab 10/14/2021 Cycle 29 pembrolizumab 11/25/2021 Cycle 30 pembrolizumab 01/06/2022 CTs 02/13/2022-unchanged posttreatment appearance of the chest, unchanged pulmonary nodules, unchanged treated metastatic lesion at the  left liver and superior pole of the right kidney 2. Anemia 3. Urinary tract infection 4. 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 Nausea 6. Constipation 7.  Right abdominal pain secondary to #1 8.  Skin rash bilateral forearms, grade 2, likely related to pembrolizumab 07/20/2019; grade 3 08/03/2019, prednisone initiated; rash improved 08/10/2019, prednisone decreased to 10 mg daily x7 days then discontinued 9.  Nodular right lip lesion 08/13/2020, referred to dermatology 10.  Hypercalcemia-mild chronic elevation, normal PTH level 06/05/2020      Disposition: Barbara Watkins has a history of metastatic non-small cell lung cancer.  She has been maintained on systemic therapy since July 2020.  The restaging CTs reveal no evidence of active lung cancer.  Pembrolizumab will be placed on hold.  She will return for an office visit in 3 months.  She will call for new symptoms.  Betsy Coder, MD  02/17/2022  9:32 AM

## 2022-02-17 NOTE — Progress Notes (Signed)
Patient seen by Dr. Benay Spice today Per physician team, patient will not be receiving treatment today. Patient is coming off of therapy per Dr. Benay Spice.

## 2022-02-28 ENCOUNTER — Encounter: Payer: Self-pay | Admitting: Oncology

## 2022-05-16 ENCOUNTER — Inpatient Hospital Stay: Payer: Medicare Other | Attending: Nurse Practitioner | Admitting: Nurse Practitioner

## 2022-05-16 ENCOUNTER — Encounter: Payer: Self-pay | Admitting: Oncology

## 2022-05-16 ENCOUNTER — Encounter: Payer: Self-pay | Admitting: Nurse Practitioner

## 2022-05-16 VITALS — BP 163/83 | HR 89 | Temp 98.2°F | Resp 18 | Ht 64.0 in | Wt 138.4 lb

## 2022-05-16 DIAGNOSIS — C3412 Malignant neoplasm of upper lobe, left bronchus or lung: Secondary | ICD-10-CM | POA: Diagnosis not present

## 2022-05-16 DIAGNOSIS — N390 Urinary tract infection, site not specified: Secondary | ICD-10-CM | POA: Diagnosis not present

## 2022-05-16 DIAGNOSIS — Z9221 Personal history of antineoplastic chemotherapy: Secondary | ICD-10-CM | POA: Insufficient documentation

## 2022-05-16 DIAGNOSIS — J9811 Atelectasis: Secondary | ICD-10-CM | POA: Diagnosis not present

## 2022-05-16 DIAGNOSIS — D649 Anemia, unspecified: Secondary | ICD-10-CM | POA: Diagnosis not present

## 2022-05-16 DIAGNOSIS — C775 Secondary and unspecified malignant neoplasm of intrapelvic lymph nodes: Secondary | ICD-10-CM | POA: Insufficient documentation

## 2022-05-16 DIAGNOSIS — C3492 Malignant neoplasm of unspecified part of left bronchus or lung: Secondary | ICD-10-CM | POA: Diagnosis not present

## 2022-05-16 DIAGNOSIS — C7902 Secondary malignant neoplasm of left kidney and renal pelvis: Secondary | ICD-10-CM | POA: Insufficient documentation

## 2022-05-16 DIAGNOSIS — Z79899 Other long term (current) drug therapy: Secondary | ICD-10-CM | POA: Insufficient documentation

## 2022-05-16 DIAGNOSIS — C787 Secondary malignant neoplasm of liver and intrahepatic bile duct: Secondary | ICD-10-CM | POA: Diagnosis not present

## 2022-05-16 NOTE — Progress Notes (Signed)
[facility] OFFICE PROGRESS NOTE   Diagnosis: Non-small cell lung cancer  INTERVAL HISTORY:   Barbara Watkins returns as scheduled.  She feels well.  No complaints.  She has a good appetite.  No pain.  No shortness of breath.  No diarrhea or skin rash.  Objective:  Vital signs in last 24 hours:  Blood pressure (!) 163/83, pulse 89, temperature 98.2 F (36.8 C), temperature source Oral, resp. rate 18, height 5' 4"  (1.626 m), weight 138 lb 6.4 oz (62.8 kg), SpO2 96 %.    Lymphatics: No palpable cervical, supraclavicular or axillary lymph nodes. Resp: Distant breath sounds.  No respiratory distress. Cardio: Regular rate and rhythm. GI: Abdomen soft and nontender.  No hepatosplenomegaly. Vascular: No leg edema.  Lab Results:  Lab Results  Component Value Date   WBC 6.5 11/25/2021   HGB 15.3 (H) 11/25/2021   HCT 46.2 (H) 11/25/2021   MCV 92.2 11/25/2021   PLT 268 11/25/2021   NEUTROABS 3.5 11/25/2021    Imaging:  No results found.  Medications: I have reviewed the patient's current medications.  Assessment/Plan: Metastatic non-small cell lung cancer  CT abdomen/pelvis 06/12/2019-scattered soft tissue masses in left hepatic lobe, right kidney, and the right adrenal gland.  Fullness of the left hilum suspicious for left hilar mass and small nodules at the right lung base.  Also noted signs of right pyelonephritis.   CT chest 06/13/2019- large encasing soft tissue mass left hilum measuring at least 5.8 x 4.5 x 3.8 cm with narrowing of the left mainstem bronchus and left pulmonary artery.  Near-total obstructive atelectasis of the left upper lobe with a portion of the lingula remaining aerated.  Multiple bilateral pulmonary nodules. Biopsy left supraclavicular lymph node 06/15/2019- metastatic lung adenocarcinoma; immunohistochemistry positive for cytokeratin 7, TTF-1, PAX 8 (weak).  Napsin-A and CDX-2 negative.  Foundation 1- microsatellite stable, tumor mutational  burden 15, ATM alteration, KRAS G13D;  tumor proportion score 100% Brain CT 06/29/2019- negative for metastatic disease Cycle 1 carboplatin/Alimta/pembrolizumab 06/29/2019 Cycle 2 carboplatin/Alimta/pembrolizumab 07/20/2019 Cycle 3 carboplatin/Alimta 08/10/2019, pembrolizumab held due to rash Cycle 4 carboplatin/Alimta, Udenyca added, pembrolizumab held 08/31/2019 Cycle 5 Alimta alone (carboplatin discontinued, pembrolizumab held) 09/22/2019 CTs 10/06/2019-decrease in size of central left upper lobe pulmonary mass; bilateral pulmonary nodules of varying size some of which are irregular in shape; many of these are stable in the interval.  Some of the right lung nodules have decreased/resolved since the prior study.  Some new irregular nodules in the left lower lobe indeterminate but potentially infectious/inflammatory.  Marked interval decrease in size of left hepatic and right renal metastases.  Metastatic retroperitoneal lymphadenopathy in the abdomen has clearly decreased.  Metastatic nodules in the right omentum and perirenal fat bilaterally have decreased/resolved.  No new or progressive findings. Cycle 6 Alimta/pembrolizumab 10/17/2019 Cycle 7 Alimta/pembrolizumab 11/08/2019 Cycle 8 Alimta/pembrolizumab  Cycle 9 Pembrolizumab 12/19/2019 (Alimta held due to elevated creatinine) Cycle 10 Pembrolizumab 01/10/2020 (Alimta held due to borderline creatinine) CTs 01/26/2020-decreased left hilar soft tissue, persistent mass effect on the left upper lobe pulmonary artery, medial left upper lobe pulmonary lesion has decreased in size with further reexpansion of the left upper lobe, 5 mm right upper lobe nodule more conspicuous, new spiculated nodular densities in the left lower lobe on previous exam have almost completely resolved, decreased left hepatic lesion, decreased right renal lesion, resolved right adrenal lesion decreased retroperitoneal lymphadenopathy Cycle 11 Alimta/pembrolizumab 01/31/2020 Cycle 12  Alimta/pembrolizumab 02/21/2020 Cycle 13 Alimta/pembrolizumab 03/13/2020, Aloxi added for delayed  nausea Cycle 14 Alimta/pembrolizumab 04/02/2020, Aloxi plus Emend Cycle 15 pembrolizumab alone 04/27/2020 (Alimta held due to elevated creatinine) Cycle 16 pembrolizumab alone 05/18/2020 CTs 06/05/2020-stable exam.  No significant change in size of medial left upper lobe lung mass.  Unchanged appearance of left hepatic lobe metastasis.  Slight decrease in size of right kidney lesion.  Small pulmonary nodules stable to minimally decreased.  No new or progressive disease. Cycle 17 pembrolizumab 06/06/2020 (6-week dosing) Cycle 18 Pembrolizumab 08/13/2020 (6-week dosing) Cycle 19 pembrolizumab 09/24/2020 (6-week dosing) Cycle 20 Pembrolizumab 11/05/2020 (6-week dosing) CTs 12/13/2020-slight decrease in left hepatic lobe metastases, stable appearance of left upper lung lesion, resolution of tiny peripheral right upper lobe nodule, scattered stable pulmonary nodules, new 6 mm left apical nodule Cycle 21 pembrolizumab 12/17/2020 (6-week dosing) Cycle 22 Pembrolizumab 01/28/2021 Cycle 23 pembrolizumab 03/11/2021 Cycle 24 Pembrolizumab 04/23/2021 CTs 05/31/2021- stable posttreatment changes involving the left upper lobe medially with 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  No findings to suggest recurrent tumor.  Stable scattered pulmonary nodules and groundglass opacities in both lungs.  Stable 5.0 x 4.7 cm partially enhancing necrotic left hepatic lobe lesion.  No new hepatic metastatic disease.  Stable scarring changes involving the upper pole region of the right kidney with adjacent persistent masslike lesion measuring 19 mm. Cycle 25 Pembrolizumab 06/04/2021 Cycle 26 pembrolizumab 07/16/2021 Cycle 27 Pembrolizumab 08/27/2021 Cycle 28 Pembrolizumab 10/14/2021 Cycle 29 pembrolizumab 11/25/2021 Cycle 30 pembrolizumab 01/06/2022 CTs 02/13/2022-unchanged posttreatment appearance of the chest, unchanged pulmonary nodules,  unchanged treated metastatic lesion at the left liver and superior pole of the right kidney 2. Anemia 3. Urinary tract infection 4. 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 Nausea 6. Constipation 7.  Right abdominal pain secondary to #1 8.  Skin rash bilateral forearms, grade 2, likely related to pembrolizumab 07/20/2019; grade 3 08/03/2019, prednisone initiated; rash improved 08/10/2019, prednisone decreased to 10 mg daily x7 days then discontinued 9.  Nodular right lip lesion 08/13/2020, referred to dermatology 10.  Hypercalcemia-mild chronic elevation, normal PTH level 06/05/2020      Disposition: Barbara Watkins appears well.  There is no clinical evidence of progression of the metastatic non-small cell lung cancer.  Plan to continue to follow with observation.  She will return for an office visit in 3 months.  We are available to see her sooner if needed.    Ned Card ANP/GNP-BC   05/16/2022  1:14 PM

## 2022-07-19 IMAGING — CT CT CHEST-ABD-PELV W/ CM
2 of 5 series · 14 of 46 positions shown, 16 images · IV contrast (agent unspecified)
Comparison: 05/31/2021

CLINICAL DATA: Metastatic non-small cell lung cancer restaging

EXAM:
CT CHEST, ABDOMEN, AND PELVIS WITH CONTRAST
TECHNIQUE: Multidetector CT imaging of the chest, abdomen and pelvis was
performed following the standard protocol during bolus
administration of intravenous contrast.

[Series 2: cap with · axial · 0.71mm/px · z∈[-189,+361]mm · 11 of 133 slices shown, 13 images]
[im 12/133  soft-tissue]
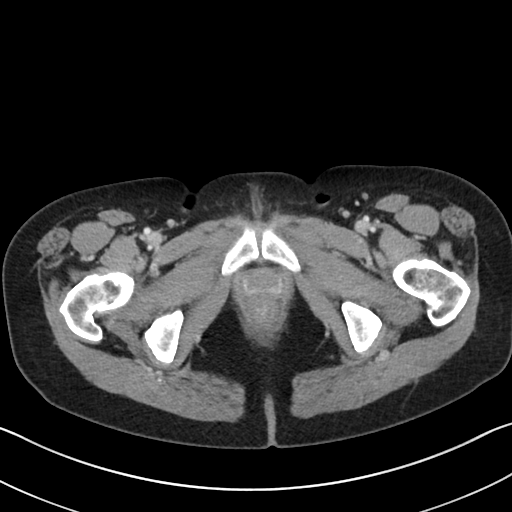
[im 12/133  bone]
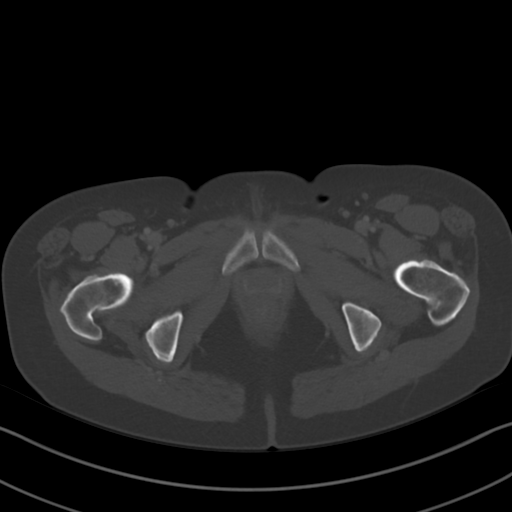
[im 23/133  soft-tissue]
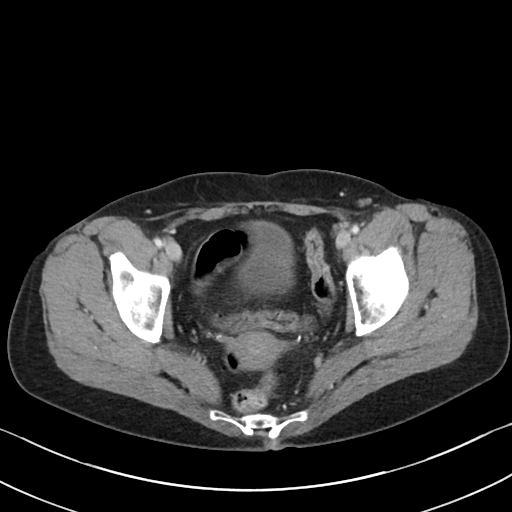
[im 34/133  soft-tissue]
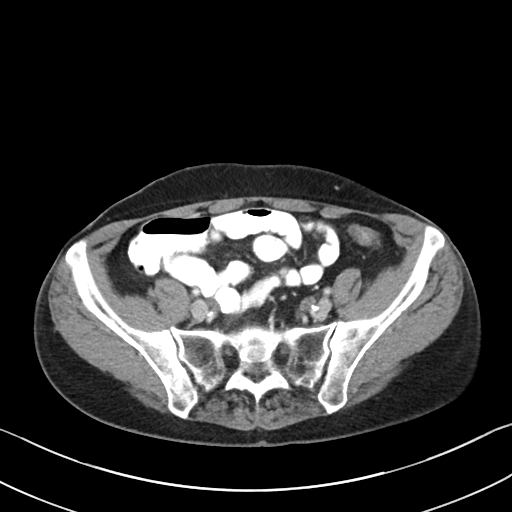
[im 45/133  soft-tissue]
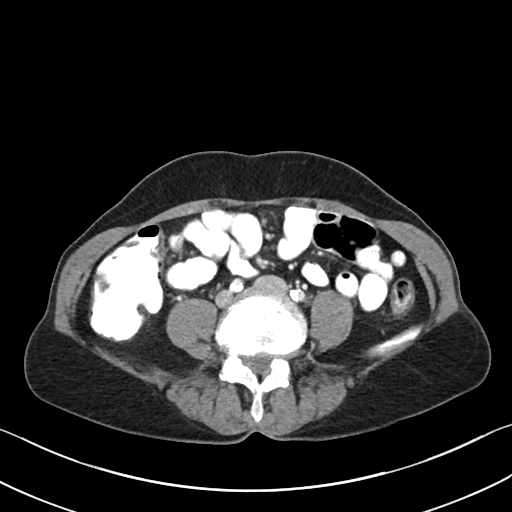
[im 56/133  soft-tissue]
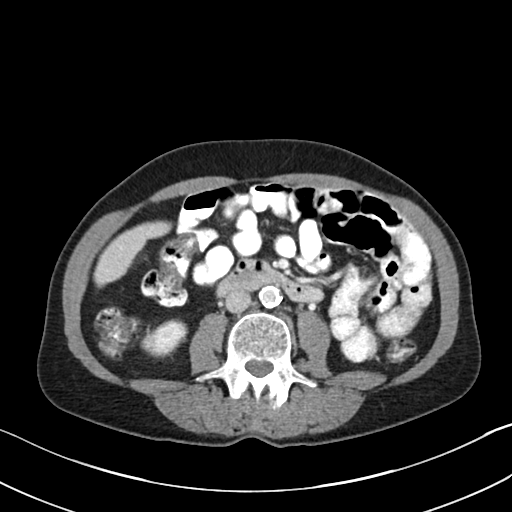
[im 67/133  soft-tissue]
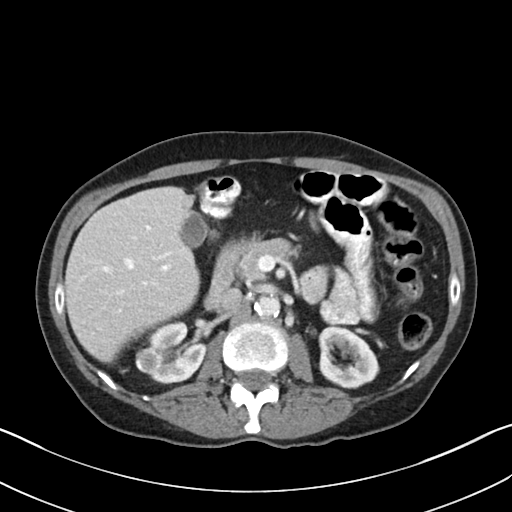
[im 78/133  soft-tissue]
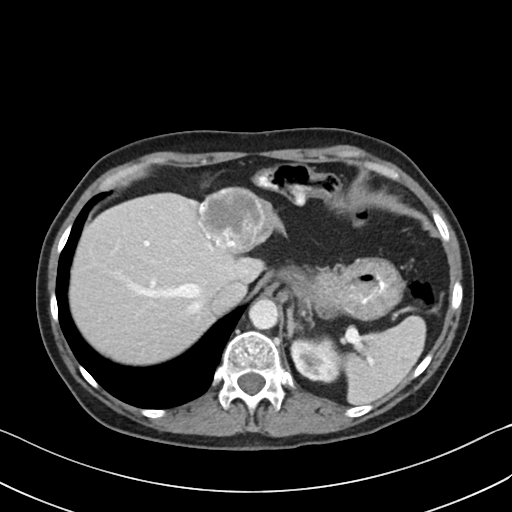
[im 89/133  soft-tissue]
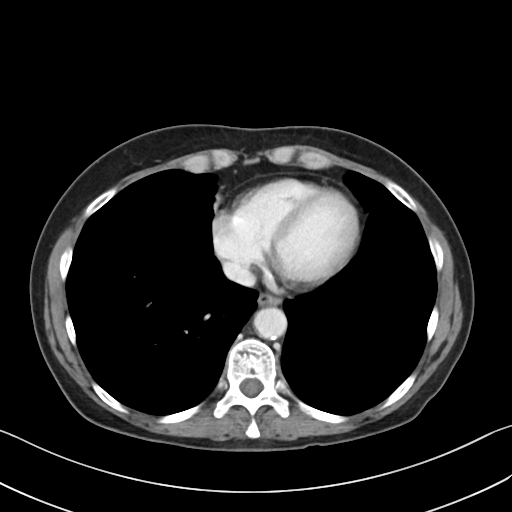
[im 100/133  soft-tissue]
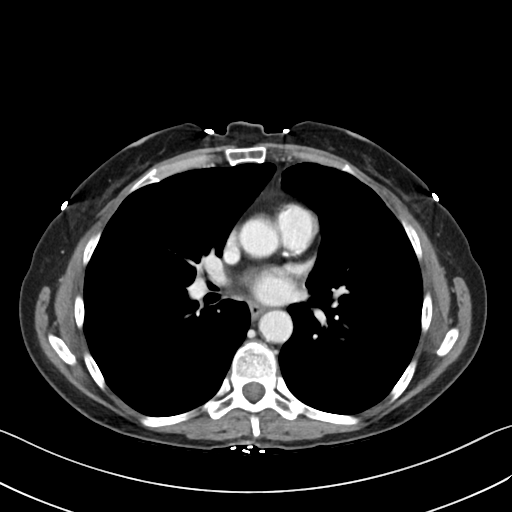
[im 100/133  bone]
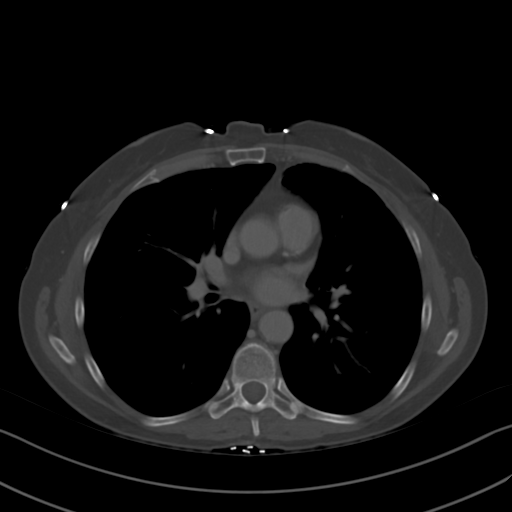
[im 111/133  soft-tissue]
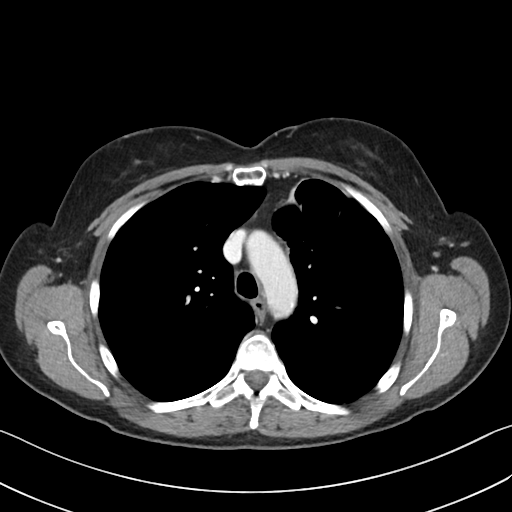
[im 122/133  soft-tissue]
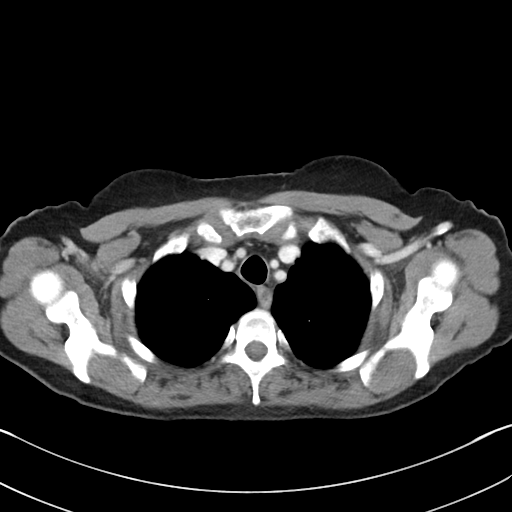

[Series 5: coronal · coronal · 0.86mm/px · 3 of 85 slices shown]
[im 29/85  soft-tissue]
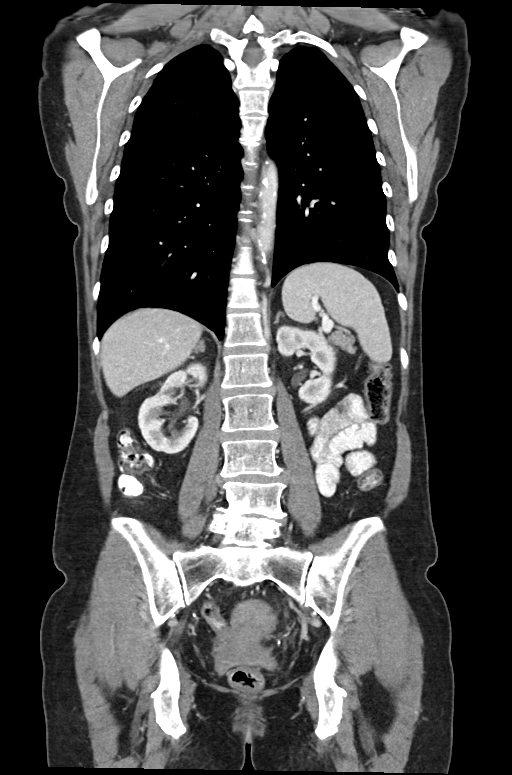
[im 38/85  soft-tissue]
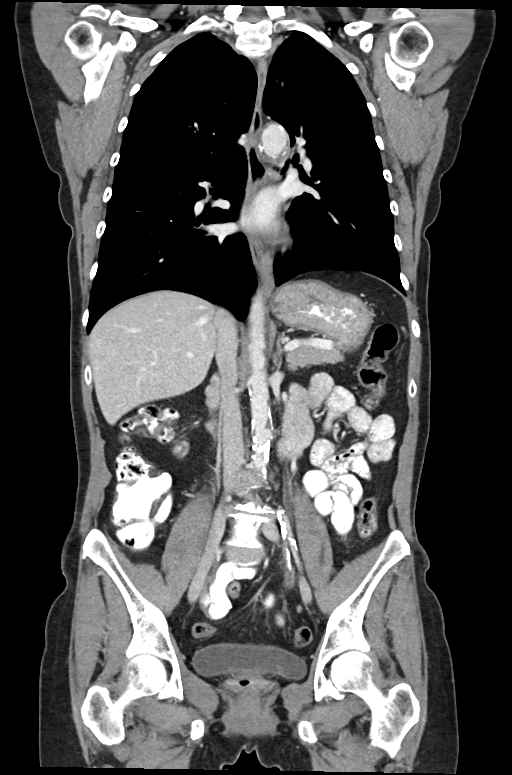
[im 47/85  soft-tissue]
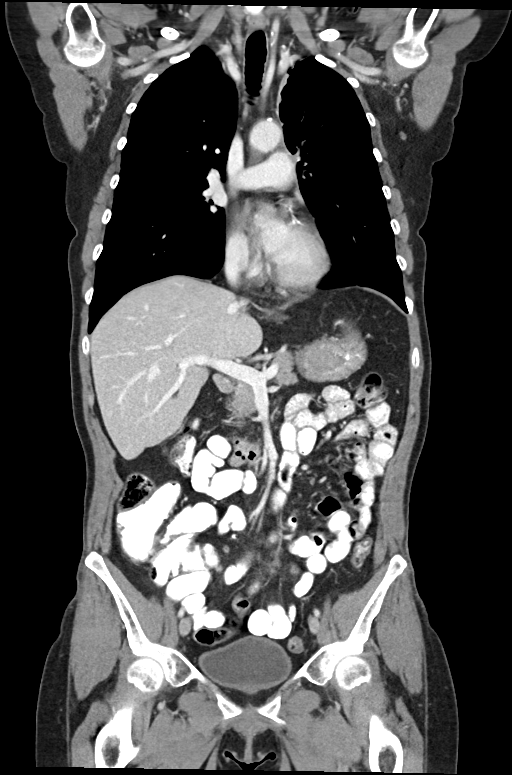

[14 of 46 positions shown; findings below may reference images not displayed]

RADIATION DOSE REDUCTION: This exam was performed according to the
departmental dose-optimization program which includes automated
exposure control, adjustment of the mA and/or kV according to
patient size and/or use of iterative reconstruction technique.

CONTRAST:  85mL OMNIPAQUE IOHEXOL 300 MG/ML SOLN, additional oral
enteric contrast
FINDINGS: CT CHEST FINDINGS

Cardiovascular: Aortic atherosclerosis. Normal heart size. Left
coronary artery calcifications. No pericardial effusion.

Mediastinum/Nodes: No enlarged mediastinal, hilar, or axillary lymph
nodes. Thyroid gland, trachea, and esophagus demonstrate no
significant findings.

Lungs/Pleura: Mild centrilobular and paraseptal emphysema. Unchanged
post treatment appearance of the chest with paramedian fibrosis and
consolidation of the left upper lobe and lingula (series 4, image
63). Multiple small ground-glass nodules and irregular opacities are
unchanged, for example a 1.0 cm ground-glass opacity of the left
lower lobe (series 4, image 84). 0.4 cm solid pulmonary nodule of
the lateral segment right middle lobe, unchanged (series 4, image
93). No pleural effusion or pneumothorax.

Musculoskeletal: No chest wall mass or suspicious osseous lesions
identified.

CT ABDOMEN PELVIS FINDINGS

Hepatobiliary: No significant change in a necrotic, faintly
calcified mass of the anterior left lobe of the liver measuring
x 4.5 cm (series 2, image 57). No gallstones, gallbladder wall
thickening, or biliary dilatation.

Pancreas: Unremarkable. No pancreatic ductal dilatation or
surrounding inflammatory changes.

Spleen: Normal in size without significant abnormality.

Adrenals/Urinary Tract: Adrenal glands are unremarkable. Unchanged
faintly rim calcified mass of the superior pole of the right kidney
measuring 1.7 x 1.7 cm (series 2, image 67). The left kidney is
normal, without renal calculi, solid lesion, or hydronephrosis.
Bladder is unremarkable.

Stomach/Bowel: Stomach is within normal limits. Appendix appears
normal. No evidence of bowel wall thickening, distention, or
inflammatory changes. Sigmoid diverticula.

Vascular/Lymphatic: Severe aortic atherosclerosis. Left common iliac
artery stent. No enlarged abdominal or pelvic lymph nodes.

Reproductive: No mass or other abnormality.

Other: No abdominal wall hernia or abnormality. No ascites.

Musculoskeletal: No acute osseous findings.
IMPRESSION: 1. Unchanged post treatment appearance of the chest with paramedian
fibrosis and consolidation of the left upper lobe and lingula. No
evidence of local malignant recurrence.
2. Multiple small bilateral pulmonary ground-glass nodules and
irregular opacities are unchanged, as well as a small pulmonary
nodule of the lateral segment right middle lobe. Attention on
follow-up.
3. Unchanged treated metastatic lesion of the left lobe of the
liver.
4. Unchanged treated metastatic lesion of the superior pole of the
right kidney.
5. Emphysema.
6. Coronary artery disease.
7. Severe atherosclerosis with left common iliac artery stent.

Aortic Atherosclerosis (W8O4U-U31.1) and Emphysema (W8O4U-9XG.1).

## 2022-07-21 ENCOUNTER — Other Ambulatory Visit: Payer: Self-pay

## 2022-07-29 ENCOUNTER — Other Ambulatory Visit: Payer: Self-pay

## 2022-08-15 ENCOUNTER — Other Ambulatory Visit (HOSPITAL_BASED_OUTPATIENT_CLINIC_OR_DEPARTMENT_OTHER): Payer: Self-pay

## 2022-08-15 ENCOUNTER — Other Ambulatory Visit: Payer: Self-pay | Admitting: Nurse Practitioner

## 2022-08-15 ENCOUNTER — Other Ambulatory Visit: Payer: Self-pay | Admitting: Oncology

## 2022-08-15 DIAGNOSIS — C3492 Malignant neoplasm of unspecified part of left bronchus or lung: Secondary | ICD-10-CM

## 2022-08-15 MED ORDER — TRAMADOL HCL 50 MG PO TABS
ORAL_TABLET | ORAL | 0 refills | Status: AC
  Filled 2022-08-15: qty 7, 7d supply, fill #0
  Filled 2022-08-25: qty 7, 7d supply, fill #1

## 2022-08-18 ENCOUNTER — Encounter: Payer: Self-pay | Admitting: Oncology

## 2022-08-25 ENCOUNTER — Inpatient Hospital Stay: Payer: Medicare Other | Attending: Oncology | Admitting: Oncology

## 2022-08-25 ENCOUNTER — Encounter: Payer: Self-pay | Admitting: Oncology

## 2022-08-25 ENCOUNTER — Other Ambulatory Visit (HOSPITAL_BASED_OUTPATIENT_CLINIC_OR_DEPARTMENT_OTHER): Payer: Self-pay

## 2022-08-25 VITALS — BP 158/88 | HR 92 | Temp 98.3°F | Resp 20 | Ht 64.0 in | Wt 139.2 lb

## 2022-08-25 DIAGNOSIS — C787 Secondary malignant neoplasm of liver and intrahepatic bile duct: Secondary | ICD-10-CM | POA: Insufficient documentation

## 2022-08-25 DIAGNOSIS — C786 Secondary malignant neoplasm of retroperitoneum and peritoneum: Secondary | ICD-10-CM | POA: Insufficient documentation

## 2022-08-25 DIAGNOSIS — C3412 Malignant neoplasm of upper lobe, left bronchus or lung: Secondary | ICD-10-CM | POA: Diagnosis not present

## 2022-08-25 DIAGNOSIS — C778 Secondary and unspecified malignant neoplasm of lymph nodes of multiple regions: Secondary | ICD-10-CM | POA: Insufficient documentation

## 2022-08-25 DIAGNOSIS — C3490 Malignant neoplasm of unspecified part of unspecified bronchus or lung: Secondary | ICD-10-CM | POA: Diagnosis not present

## 2022-08-25 DIAGNOSIS — C3492 Malignant neoplasm of unspecified part of left bronchus or lung: Secondary | ICD-10-CM | POA: Diagnosis not present

## 2022-08-25 NOTE — Progress Notes (Signed)
[place] OFFICE PROGRESS NOTE   Diagnosis: Non-small cell lung cancer  INTERVAL HISTORY:   Barbara Watkins returns as scheduled.  She feels well.  Good appetite.  Stable back pain.  No new complaint.  Objective:  Vital signs in last 24 hours:  Blood pressure (!) 158/88, pulse 92, temperature 98.3 F (36.8 C), temperature source Oral, resp. rate 20, height 5' 4"  (1.626 m), weight 139 lb 3.2 oz (63.1 kg), SpO2 98 %.    HEENT: Neck without mass Lymphatics: No cervical, supraclavicular, axillary, or inguinal nodes Resp: Decreased breath sounds at the right compared to the left posterior chest, no respiratory distress Cardio: Regular rate and rhythm GI: No hepatosplenomegaly Vascular: No leg edema  Lab Results:  Lab Results  Component Value Date   WBC 6.5 11/25/2021   HGB 15.3 (H) 11/25/2021   HCT 46.2 (H) 11/25/2021   MCV 92.2 11/25/2021   PLT 268 11/25/2021   NEUTROABS 3.5 11/25/2021    CMP  Lab Results  Component Value Date   NA 138 02/17/2022   K 4.0 02/17/2022   CL 106 02/17/2022   CO2 23 02/17/2022   GLUCOSE 87 02/17/2022   BUN 14 02/17/2022   CREATININE 1.16 (H) 02/17/2022   CALCIUM 10.0 02/17/2022   PROT 7.0 01/06/2022   ALBUMIN 4.3 01/06/2022   AST 14 (L) 01/06/2022   ALT 8 01/06/2022   ALKPHOS 79 01/06/2022   BILITOT 0.5 01/06/2022   GFRNONAA 54 (L) 02/17/2022   GFRAA 52 (L) 09/24/2020    Medications: I have reviewed the patient's current medications.   Assessment/Plan: Metastatic non-small cell lung cancer  CT abdomen/pelvis 06/12/2019-scattered soft tissue masses in left hepatic lobe, right kidney, and the right adrenal gland.  Fullness of the left hilum suspicious for left hilar mass and small nodules at the right lung base.  Also noted signs of right pyelonephritis.   CT chest 06/13/2019- large encasing soft tissue mass left hilum measuring at least 5.8 x 4.5 x 3.8 cm with narrowing of the left mainstem bronchus and left pulmonary  artery.  Near-total obstructive atelectasis of the left upper lobe with a portion of the lingula remaining aerated.  Multiple bilateral pulmonary nodules. Biopsy left supraclavicular lymph node 06/15/2019- metastatic lung adenocarcinoma; immunohistochemistry positive for cytokeratin 7, TTF-1, PAX 8 (weak).  Napsin-A and CDX-2 negative.  Foundation 1- microsatellite stable, tumor mutational burden 15, ATM alteration, KRAS G13D;  tumor proportion score 100% Brain CT 06/29/2019- negative for metastatic disease Cycle 1 carboplatin/Alimta/pembrolizumab 06/29/2019 Cycle 2 carboplatin/Alimta/pembrolizumab 07/20/2019 Cycle 3 carboplatin/Alimta 08/10/2019, pembrolizumab held due to rash Cycle 4 carboplatin/Alimta, Udenyca added, pembrolizumab held 08/31/2019 Cycle 5 Alimta alone (carboplatin discontinued, pembrolizumab held) 09/22/2019 CTs 10/06/2019-decrease in size of central left upper lobe pulmonary mass; bilateral pulmonary nodules of varying size some of which are irregular in shape; many of these are stable in the interval.  Some of the right lung nodules have decreased/resolved since the prior study.  Some new irregular nodules in the left lower lobe indeterminate but potentially infectious/inflammatory.  Marked interval decrease in size of left hepatic and right renal metastases.  Metastatic retroperitoneal lymphadenopathy in the abdomen has clearly decreased.  Metastatic nodules in the right omentum and perirenal fat bilaterally have decreased/resolved.  No new or progressive findings. Cycle 6 Alimta/pembrolizumab 10/17/2019 Cycle 7 Alimta/pembrolizumab 11/08/2019 Cycle 8 Alimta/pembrolizumab  Cycle 9 Pembrolizumab 12/19/2019 (Alimta held due to elevated creatinine) Cycle 10 Pembrolizumab 01/10/2020 (Alimta held due to borderline creatinine) CTs 01/26/2020-decreased left hilar soft tissue,  persistent mass effect on the left upper lobe pulmonary artery, medial left upper lobe pulmonary lesion has decreased in size  with further reexpansion of the left upper lobe, 5 mm right upper lobe nodule more conspicuous, new spiculated nodular densities in the left lower lobe on previous exam have almost completely resolved, decreased left hepatic lesion, decreased right renal lesion, resolved right adrenal lesion decreased retroperitoneal lymphadenopathy Cycle 11 Alimta/pembrolizumab 01/31/2020 Cycle 12 Alimta/pembrolizumab 02/21/2020 Cycle 13 Alimta/pembrolizumab 03/13/2020, Aloxi added for delayed nausea Cycle 14 Alimta/pembrolizumab 04/02/2020, Aloxi plus Emend Cycle 15 pembrolizumab alone 04/27/2020 (Alimta held due to elevated creatinine) Cycle 16 pembrolizumab alone 05/18/2020 CTs 06/05/2020-stable exam.  No significant change in size of medial left upper lobe lung mass.  Unchanged appearance of left hepatic lobe metastasis.  Slight decrease in size of right kidney lesion.  Small pulmonary nodules stable to minimally decreased.  No new or progressive disease. Cycle 17 pembrolizumab 06/06/2020 (6-week dosing) Cycle 18 Pembrolizumab 08/13/2020 (6-week dosing) Cycle 19 pembrolizumab 09/24/2020 (6-week dosing) Cycle 20 Pembrolizumab 11/05/2020 (6-week dosing) CTs 12/13/2020-slight decrease in left hepatic lobe metastases, stable appearance of left upper lung lesion, resolution of tiny peripheral right upper lobe nodule, scattered stable pulmonary nodules, new 6 mm left apical nodule Cycle 21 pembrolizumab 12/17/2020 (6-week dosing) Cycle 22 Pembrolizumab 01/28/2021 Cycle 23 pembrolizumab 03/11/2021 Cycle 24 Pembrolizumab 04/23/2021 CTs 05/31/2021- stable posttreatment changes involving the left upper lobe medially with 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  No findings to suggest recurrent tumor.  Stable scattered pulmonary nodules and groundglass opacities in both lungs.  Stable 5.0 x 4.7 cm partially enhancing necrotic left hepatic lobe lesion.  No new hepatic metastatic disease.  Stable scarring changes involving the upper pole  region of the right kidney with adjacent persistent masslike lesion measuring 19 mm. Cycle 25 Pembrolizumab 06/04/2021 Cycle 26 pembrolizumab 07/16/2021 Cycle 27 Pembrolizumab 08/27/2021 Cycle 28 Pembrolizumab 10/14/2021 Cycle 29 pembrolizumab 11/25/2021 Cycle 30 pembrolizumab 01/06/2022 CTs 02/13/2022-unchanged posttreatment appearance of the chest, unchanged pulmonary nodules, unchanged treated metastatic lesion at the left liver and superior pole of the right kidney 2. Anemia 3. Urinary tract infection 4. 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 Nausea 6. Constipation 7.  Right abdominal pain secondary to #1 8.  Skin rash bilateral forearms, grade 2, likely related to pembrolizumab 07/20/2019; grade 3 08/03/2019, prednisone initiated; rash improved 08/10/2019, prednisone decreased to 10 mg daily x7 days then discontinued 9.  Nodular right lip lesion 08/13/2020, referred to dermatology 10.  Hypercalcemia-mild chronic elevation, normal PTH level 06/05/2020       Disposition: Ms. Pixley remains in clinical remission from non-small cell lung cancer.  She will undergo restaging CTs prior to an office visit in 3 months.  She will call in the interim for new symptoms.  Betsy Coder, MD  08/25/2022  3:23 PM

## 2022-11-24 ENCOUNTER — Ambulatory Visit (HOSPITAL_BASED_OUTPATIENT_CLINIC_OR_DEPARTMENT_OTHER): Admission: RE | Admit: 2022-11-24 | Payer: Medicare Other | Source: Ambulatory Visit

## 2022-11-24 ENCOUNTER — Inpatient Hospital Stay: Payer: Medicare Other

## 2022-11-26 ENCOUNTER — Inpatient Hospital Stay: Payer: Medicare Other | Admitting: Nurse Practitioner

## 2023-08-28 DIAGNOSIS — C44612 Basal cell carcinoma of skin of right upper limb, including shoulder: Secondary | ICD-10-CM | POA: Diagnosis not present

## 2023-08-28 DIAGNOSIS — C44311 Basal cell carcinoma of skin of nose: Secondary | ICD-10-CM | POA: Diagnosis not present

## 2023-11-19 ENCOUNTER — Encounter: Payer: Self-pay | Admitting: Oncology

## 2023-11-19 NOTE — Telephone Encounter (Signed)
Telephone call  

## 2024-08-03 ENCOUNTER — Encounter: Payer: Self-pay | Admitting: Oncology

## 2024-09-07 ENCOUNTER — Telehealth: Payer: Self-pay | Admitting: Oncology

## 2024-09-07 NOTE — Telephone Encounter (Signed)
 PT called to scheduled an appointment, let PT know that we would need a referral from PCP since she has not been seen since 2023.

## 2024-09-14 DIAGNOSIS — D485 Neoplasm of uncertain behavior of skin: Secondary | ICD-10-CM | POA: Diagnosis not present

## 2024-10-26 NOTE — Progress Notes (Signed)
 Barbara Watkins                                          MRN: [id number]   10/26/2024   The [facility] Quality Team Specialist reviewed this patient medical record for the purposes of chart review for care gap closure. The following were reviewed: chart review for care gap closure-breast cancer screening and colorectal cancer screening.    [facility] Quality Team
